# Patient Record
Sex: Female | Born: 1935 | Race: White | Hispanic: No | Marital: Married | Smoking: Former smoker
Health system: Southern US, Community
[De-identification: ages and names within clinical notes are randomized; demographics above are authoritative.]

## PROBLEM LIST (undated history)

## (undated) DIAGNOSIS — J449 Chronic obstructive pulmonary disease, unspecified: Secondary | ICD-10-CM

## (undated) DIAGNOSIS — K859 Acute pancreatitis without necrosis or infection, unspecified: Secondary | ICD-10-CM

## (undated) DIAGNOSIS — K219 Gastro-esophageal reflux disease without esophagitis: Secondary | ICD-10-CM

## (undated) DIAGNOSIS — C3490 Malignant neoplasm of unspecified part of unspecified bronchus or lung: Secondary | ICD-10-CM

## (undated) DIAGNOSIS — M8580 Other specified disorders of bone density and structure, unspecified site: Secondary | ICD-10-CM

## (undated) DIAGNOSIS — E039 Hypothyroidism, unspecified: Secondary | ICD-10-CM

## (undated) DIAGNOSIS — I10 Essential (primary) hypertension: Secondary | ICD-10-CM

## (undated) DIAGNOSIS — F39 Unspecified mood [affective] disorder: Secondary | ICD-10-CM

## (undated) DIAGNOSIS — K5909 Other constipation: Secondary | ICD-10-CM

## (undated) DIAGNOSIS — K8689 Other specified diseases of pancreas: Secondary | ICD-10-CM

## (undated) DIAGNOSIS — H269 Unspecified cataract: Secondary | ICD-10-CM

## (undated) DIAGNOSIS — I872 Venous insufficiency (chronic) (peripheral): Secondary | ICD-10-CM

## (undated) HISTORY — DX: Other constipation: K59.09

## (undated) HISTORY — DX: Venous insufficiency (chronic) (peripheral): I87.2

## (undated) HISTORY — DX: Unspecified mood (affective) disorder: F39

## (undated) HISTORY — DX: Acute pancreatitis without necrosis or infection, unspecified: K85.90

## (undated) HISTORY — DX: Unspecified cataract: H26.9

## (undated) HISTORY — DX: Hypothyroidism, unspecified: E03.9

## (undated) HISTORY — DX: Essential (primary) hypertension: I10

## (undated) HISTORY — DX: Chronic obstructive pulmonary disease, unspecified: J44.9

## (undated) HISTORY — DX: Other specified disorders of bone density and structure, unspecified site: M85.80

## (undated) HISTORY — DX: Malignant neoplasm of unspecified part of unspecified bronchus or lung: C34.90

## (undated) HISTORY — DX: Gastro-esophageal reflux disease without esophagitis: K21.9

## (undated) HISTORY — DX: Other specified diseases of pancreas: K86.89

---
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
## 1957-01-25 HISTORY — PX: OOPHORECTOMY: SHX86

## 1976-01-26 HISTORY — PX: OTHER SURGICAL HISTORY: SHX169

## 1984-01-26 HISTORY — PX: CATARACT EXTRACTION: SUR2

## 1985-01-25 HISTORY — PX: CATARACT EXTRACTION: SUR2

## 1989-01-25 HISTORY — PX: OTHER SURGICAL HISTORY: SHX169

## 1995-01-26 HISTORY — PX: RETINAL DETACHMENT SURGERY: SHX105

## 2006-01-25 HISTORY — PX: THORACOTOMY: SUR1349

## 2008-11-25 ENCOUNTER — Emergency Department: Payer: Self-pay | Admitting: Emergency Medicine

## 2009-05-19 ENCOUNTER — Ambulatory Visit: Payer: Self-pay | Admitting: Unknown Physician Specialty

## 2009-10-20 ENCOUNTER — Ambulatory Visit: Payer: Self-pay | Admitting: Gastroenterology

## 2009-10-22 LAB — PATHOLOGY REPORT

## 2010-08-19 ENCOUNTER — Ambulatory Visit: Payer: Self-pay | Admitting: Unknown Physician Specialty

## 2011-09-05 ENCOUNTER — Emergency Department: Payer: Self-pay | Admitting: Emergency Medicine

## 2011-10-21 ENCOUNTER — Ambulatory Visit: Payer: Self-pay | Admitting: Internal Medicine

## 2012-08-21 ENCOUNTER — Ambulatory Visit: Payer: Self-pay | Admitting: Internal Medicine

## 2013-01-31 ENCOUNTER — Ambulatory Visit: Payer: Self-pay | Admitting: Family Medicine

## 2013-02-06 ENCOUNTER — Ambulatory Visit: Payer: Self-pay | Admitting: Family Medicine

## 2013-02-26 ENCOUNTER — Ambulatory Visit: Payer: Self-pay | Admitting: Gastroenterology

## 2013-04-16 ENCOUNTER — Ambulatory Visit: Payer: Self-pay | Admitting: Gastroenterology

## 2013-04-27 ENCOUNTER — Ambulatory Visit: Payer: Self-pay | Admitting: Gastroenterology

## 2013-07-24 ENCOUNTER — Encounter (INDEPENDENT_AMBULATORY_CARE_PROVIDER_SITE_OTHER): Payer: Self-pay

## 2013-07-24 ENCOUNTER — Encounter: Payer: Self-pay | Admitting: Pulmonary Disease

## 2013-07-24 ENCOUNTER — Ambulatory Visit (INDEPENDENT_AMBULATORY_CARE_PROVIDER_SITE_OTHER): Payer: Medicare Other | Admitting: Pulmonary Disease

## 2013-07-24 VITALS — BP 128/56 | HR 73 | Ht 63.0 in | Wt 182.0 lb

## 2013-07-24 DIAGNOSIS — J432 Centrilobular emphysema: Secondary | ICD-10-CM

## 2013-07-24 DIAGNOSIS — R5383 Other fatigue: Secondary | ICD-10-CM | POA: Insufficient documentation

## 2013-07-24 DIAGNOSIS — R5381 Other malaise: Secondary | ICD-10-CM

## 2013-07-24 DIAGNOSIS — R0609 Other forms of dyspnea: Secondary | ICD-10-CM

## 2013-07-24 DIAGNOSIS — J438 Other emphysema: Secondary | ICD-10-CM

## 2013-07-24 DIAGNOSIS — I2789 Other specified pulmonary heart diseases: Secondary | ICD-10-CM

## 2013-07-24 DIAGNOSIS — I272 Pulmonary hypertension, unspecified: Secondary | ICD-10-CM | POA: Insufficient documentation

## 2013-07-24 DIAGNOSIS — R0989 Other specified symptoms and signs involving the circulatory and respiratory systems: Secondary | ICD-10-CM

## 2013-07-24 DIAGNOSIS — G4733 Obstructive sleep apnea (adult) (pediatric): Secondary | ICD-10-CM

## 2013-07-24 DIAGNOSIS — J439 Emphysema, unspecified: Secondary | ICD-10-CM | POA: Insufficient documentation

## 2013-07-24 DIAGNOSIS — R0600 Dyspnea, unspecified: Secondary | ICD-10-CM | POA: Insufficient documentation

## 2013-07-24 NOTE — Progress Notes (Signed)
Subjective:    Patient ID: Nicole Arnold, female    DOB: 1928/10/09, 80 y.o.   MRN: [id number]  HPI  This is a very pleasant 80 year old female who comes to our clinic today for evaluation of shortness of breath and possibly a pulmonary hypertension. She tells me that she smoked one half pack of cigarettes daily for 40 years and quit in 1995. As a child she had a severe episode of pneumonia when she was approximately 19 weeks old and apparently nearly died. Despite this, she never had problems with breathing or frequent cough over the years afterwards. She also tells me that she never had frequent episodes of bronchitis or pulmonary infections. 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 This is associated with a prolonged rehabilitation. Afterwards. Ever since then, her family says that her breathing has not quite been the same. She has had shortness of breath on exertion alone and never at rest. Her family believes that it has worsened somewhat over the last 4-6 years. Specifically, she tells me that she will get short of breath when she "moves too fast". She says that she cannot walk through the grocery store pushing a cart of groceries without getting short of breath. She can carry in groceries but this does cause some dyspnea. Often her walking is limited by severe back pain which is been a problem for her over the years. She does not climb a flight of stairs and she does not clean her house to run a vacuum cleaner on a regular basis.  She does not have a cough on a regular basis and she does not produce mucus.  In addition to the dyspnea she tells me that she has had generalized fatigue for the last several years with the shortness of breath. She says that she often just feels like she doesn't have the energy to get up and go. She does not exercise on a regular basis. She has not taken help therapies.  She says that she takes  a nap every day in the afternoon for about an hour. She sleeps from 9 PM until about 6 AM. She has been told that she snores but she does not know she has witnessed apneas. She denies morning headaches. She has never had a polysomnogram.  Past Medical History  Diagnosis Date  . Pancreatitis   . Cataract   . Chronic constipation   . GERD (gastroesophageal reflux disease)   . Osteopenia      No family history on file.   History   Social History  . Marital Status: Married    Spouse Name: N/A    Number of Children: N/A  . Years of Education: N/A   Occupational History  . Not on file.   Social History Main Topics  . Smoking status: Former Smoker -- 0.50 packs/day for 30 years    Types: Cigarettes    Quit date: 01/25/1993  . Smokeless tobacco: Former Systems developer  . Alcohol Use: Not on file  . Drug Use: Not on file  . Sexual Activity: Not on file   Other Topics Concern  . Not on file   Social History Narrative  . No narrative on file     Allergies  Allergen Reactions  . Amoxicillin     Mouth/tongue/throat swelling  . Codeine     vomiting  . Neosporin [Neomycin-Bacitracin Zn-Polymyx]   . Sulfa Antibiotics      No outpatient  prescriptions prior to visit.   No facility-administered medications prior to visit.      Review of Systems  Constitutional: Negative for fever and unexpected weight change.  HENT: Negative for congestion, dental problem, ear pain, nosebleeds, postnasal drip, rhinorrhea, sinus pressure, sneezing, sore throat and trouble swallowing.   Eyes: Negative for redness and itching.  Respiratory: Positive for shortness of breath. Negative for cough, chest tightness and wheezing.   Cardiovascular: Positive for leg swelling. Negative for palpitations.  Gastrointestinal: Negative for nausea and vomiting.  Genitourinary: Negative for dysuria.  Musculoskeletal: Negative for joint swelling.  Skin: Negative for rash.  Neurological: Negative for headaches.    Hematological: Does not bruise/bleed easily.  Psychiatric/Behavioral: Negative for dysphoric mood. The patient is not nervous/anxious.        Objective:   Physical Exam Filed Vitals:   07/24/13 1443  BP: 128/56  Pulse: 73  Height: 5\' 3"  (1.6 m)  Weight: 182 lb (82.555 kg)  SpO2: 95%  RA  Ambulated 500 feet on room air her oxygen saturation remained above 95%.  Gen: Elderly white female, overweight, no acute distress HEENT: NCAT, PERRL, EOMi, OP clear, neck supple without masses PULM: Few crackles left base, wheezes right upper lobe otherwise clear with good air movement CV: RRR, no mgr, no JVD AB: BS+, soft, nontender, no hsm Ext: warm, no edema, no clubbing, no cyanosis Derm: no rash or skin breakdown Neuro: A&Ox4, CN II-XII intact, strength 5/5 in all 4 extremities  April 2015 CT chest reviewed by me> there is centrilobular emphysema more prominent in the bases, there is scarring in her left base. There is mild bronchiectasis in the right middle lobe as well as in the lingula. There was mild tree in bud abnormalities in the right upper lobe, there were multiple scattered pulmonary nodules about 5 mm in size  April 2015 echocardiogram> LVEF 60%. Left ventricle is normal in size. RV is normal in size and function but there was a comment of mild dilatation and the RVSP was estimated to be 59 mmHg     Assessment & Plan:   Dyspnea I explained to Nicole Arnold and her family today that the differential diagnosis of dyspnea is broad and includes lung disease, heart disease, anemia, and neurologic illnesses among many other things. In her particular case, she appears to be deconditioned, is slightly overweight, has emphysema on his CT scan of her chest, and appears to have pulmonary hypertension. All of this can contribute to shortness of breath. I do not believe that she is anemic, but I do not have recent blood work to evaluate that. From the sounds of things she does not have  underlying cardiac disease with the exception of a suggestion of pulmonary hypertension seen on a recent echocardiogram, see below.  She appeared to have mild bronchiectasis on the CT scan of her chest. This was in the right middle lobe and lingula. I believe this is related to a prior severe infection she had in infancy. Fortunately, she does not have symptoms of cough or mucus production to suggest that this is clinically significant.  Today when she ambulated she did not require oxygen to maintain her oxygen saturation above 94%.  Plan: See outline below  Emphysema lung Though not mentioned by radiology, she had mild centrilobular emphysema on the April 2015 CT scan of her chest. I believe that this is directly related to her previous smoking behavior. She smoked one half packs of cigarettes daily for 40 years and  quit in 1995.  Plan: -Full pulmonary function testing to look for COPD  Pulmonary hypertension Her echocardiogram from April 2015 showed a normal left ventricular systolic function, but mildly dilated right heart with preserved RV systolic function but significantly elevated pulmonary pressures. The RVSP was estimated to be 59 mmHg.  I explained to her and her family that this is only an estimate of pulmonary pressure and that the gold standard would be to perform a right heart catheterization. The differential diagnosis of pulmonary hypertension is broad. In recent years this condition has been diagnosed more frequently in older adults but it is rarely caused by a specific condition that would respond to pulmonary vasodilators. In her particular case I am most concerned about underlying pulmonary disease (IE emphysema) as well as possibly obstructive sleep apnea which could contribute.  Plan: -We will start our evaluation with pulmonary function testing and a sleep study. - Because the likelihood of her being a candidate for pulmonary vasodilators is low, we will hold off on a  right heart cath at this time  Fatigue She notes generalized fatigue, afternoon somnolence, and heavy snoring. She is overweight and with this constellation of symptoms I am concerned about obstructive sleep apnea.  Plan: -Polysomnogram ordered    Updated Medication List Outpatient Encounter Prescriptions as of 07/24/2013  Medication Sig  . ALPRAZolam (XANAX) 0.5 MG tablet Take 0.5 mg by mouth 3 (three) times daily as needed for anxiety.  Marland Kitchen amLODipine (NORVASC) 10 MG tablet Take 10 mg by mouth daily.  Marland Kitchen aspirin 81 MG tablet Take 81 mg by mouth daily.  . Calcium-Vitamin D (CALTRATE 600 PLUS-VIT D PO) Take 1 tablet by mouth 2 (two) times daily.  . lansoprazole (PREVACID) 30 MG capsule Take 30 mg by mouth daily at 12 noon.  . Multiple Vitamins-Minerals (CENTRUM SILVER ADULT 50+ PO) Take 1 tablet by mouth daily.  Marland Kitchen PARoxetine (PAXIL) 20 MG tablet Take 20 mg by mouth daily.  . polyethylene glycol (MIRALAX / GLYCOLAX) packet Take 17 g by mouth daily.  . potassium chloride (KLOR-CON) 20 MEQ packet Take 20 mEq by mouth daily.  . raloxifene (EVISTA) 60 MG tablet Take 60 mg by mouth daily.  Marland Kitchen torsemide (DEMADEX) 10 MG tablet Take 10 mg by mouth daily.  . vitamin E 400 UNIT capsule Take 400 Units by mouth daily.

## 2013-07-24 NOTE — Patient Instructions (Signed)
We will arrange a pulmonary function test and a sleep study for you Stay active and exercise regularly We will see you back in 3-4 weeks in [facility]

## 2013-07-24 NOTE — Assessment & Plan Note (Addendum)
Her echocardiogram from April 2015 showed a normal left ventricular systolic function, but mildly dilated right heart with preserved RV systolic function but significantly elevated pulmonary pressures. The RVSP was estimated to be 59 mmHg.  I explained to her and her family that this is only an estimate of pulmonary pressure and that the gold standard would be to perform a right heart catheterization. The differential diagnosis of pulmonary hypertension is broad. In recent years this condition has been diagnosed more frequently in older adults but it is rarely caused by a specific condition that would respond to pulmonary vasodilators. In her particular case I am most concerned about underlying pulmonary disease (IE emphysema) as well as possibly obstructive sleep apnea which could contribute.  Plan: -We will start our evaluation with pulmonary function testing and a sleep study. - Because the likelihood of her being a candidate for pulmonary vasodilators is low, we will hold off on a right heart cath at this time

## 2013-07-24 NOTE — Assessment & Plan Note (Addendum)
I explained to Nicole Arnold and her family today that the differential diagnosis of dyspnea is broad and includes lung disease, heart disease, anemia, and neurologic illnesses among many other things. In her particular case, she appears to be deconditioned, is slightly overweight, has emphysema on his CT scan of her chest, and appears to have pulmonary hypertension. All of this can contribute to shortness of breath. I do not believe that she is anemic, but I do not have recent blood work to evaluate that. From the sounds of things she does not have underlying cardiac disease with the exception of a suggestion of pulmonary hypertension seen on a recent echocardiogram, see below.  She appeared to have mild bronchiectasis on the CT scan of her chest. This was in the right middle lobe and lingula. I believe this is related to a prior severe infection she had in infancy. Fortunately, she does not have symptoms of cough or mucus production to suggest that this is clinically significant.  Today when she ambulated she did not require oxygen to maintain her oxygen saturation above 94%.  Plan: See outline below

## 2013-07-24 NOTE — Assessment & Plan Note (Signed)
Though not mentioned by radiology, she had mild centrilobular emphysema on the April 2015 CT scan of her chest. I believe that this is directly related to her previous smoking behavior. She smoked one half packs of cigarettes daily for 40 years and quit in 1995.  Plan: -Full pulmonary function testing to look for COPD

## 2013-07-24 NOTE — Assessment & Plan Note (Signed)
She notes generalized fatigue, afternoon somnolence, and heavy snoring. She is overweight and with this constellation of symptoms I am concerned about obstructive sleep apnea.  Plan: -Polysomnogram ordered

## 2013-07-31 ENCOUNTER — Ambulatory Visit (HOSPITAL_BASED_OUTPATIENT_CLINIC_OR_DEPARTMENT_OTHER): Payer: Medicare Other | Attending: Pulmonary Disease | Admitting: Radiology

## 2013-07-31 ENCOUNTER — Ambulatory Visit: Payer: Self-pay | Admitting: Pulmonary Disease

## 2013-07-31 VITALS — Ht 63.0 in | Wt 182.0 lb

## 2013-07-31 DIAGNOSIS — I491 Atrial premature depolarization: Secondary | ICD-10-CM | POA: Insufficient documentation

## 2013-07-31 DIAGNOSIS — G4733 Obstructive sleep apnea (adult) (pediatric): Secondary | ICD-10-CM | POA: Insufficient documentation

## 2013-07-31 DIAGNOSIS — G4710 Hypersomnia, unspecified: Secondary | ICD-10-CM | POA: Diagnosis present

## 2013-07-31 DIAGNOSIS — I498 Other specified cardiac arrhythmias: Secondary | ICD-10-CM | POA: Insufficient documentation

## 2013-07-31 LAB — PULMONARY FUNCTION TEST

## 2013-08-06 ENCOUNTER — Ambulatory Visit (INDEPENDENT_AMBULATORY_CARE_PROVIDER_SITE_OTHER): Payer: Medicare Other | Admitting: Pulmonary Disease

## 2013-08-06 ENCOUNTER — Encounter: Payer: Self-pay | Admitting: Pulmonary Disease

## 2013-08-06 VITALS — BP 124/68 | HR 70 | Ht 63.0 in | Wt 182.0 lb

## 2013-08-06 DIAGNOSIS — I272 Pulmonary hypertension, unspecified: Secondary | ICD-10-CM

## 2013-08-06 DIAGNOSIS — J449 Chronic obstructive pulmonary disease, unspecified: Secondary | ICD-10-CM

## 2013-08-06 DIAGNOSIS — J432 Centrilobular emphysema: Secondary | ICD-10-CM

## 2013-08-06 DIAGNOSIS — R5383 Other fatigue: Secondary | ICD-10-CM

## 2013-08-06 DIAGNOSIS — I2789 Other specified pulmonary heart diseases: Secondary | ICD-10-CM

## 2013-08-06 DIAGNOSIS — J438 Other emphysema: Secondary | ICD-10-CM

## 2013-08-06 DIAGNOSIS — R5381 Other malaise: Secondary | ICD-10-CM

## 2013-08-06 MED ORDER — IPRATROPIUM-ALBUTEROL 20-100 MCG/ACT IN AERS: 1.0000 | INHALATION_SPRAY | Freq: Four times a day (QID) | RESPIRATORY_TRACT | Status: DC | PRN

## 2013-08-06 NOTE — Progress Notes (Signed)
Subjective:    Patient ID: Nicole Arnold, female    DOB: February 26, 1928, 80 y.o.   MRN: [id number]  Synopsis: First saw [facility] in 2015 for evaluation of mild pulmonary hypertension seen on an echocardiogram.  07/2013 PFT> Ratio 56% FEV1 1.56L (96%, 9% change), TLC 3.85L (85% pred), DLCO 9.8 (50% pred)  April 2015 CT chest reviewed by me> there is centrilobular emphysema more prominent in the bases, there is scarring in her left base. There is mild bronchiectasis in the right middle lobe as well as in the lingula. There was mild tree in bud abnormalities in the right upper lobe, there were multiple scattered pulmonary nodules about 5 mm in size  April 2015 echocardiogram> LVEF 60%. Left ventricle is normal in size. RV is normal in size and function but there was a comment of mild dilatation and the RVSP was estimated to be 59 mmHg   HPI  08/06/2013 > Nygeria has been doing okay since the last visit. There is really been no change in her symptoms. She has been trying to walk more regularly and has been 6 the last 7 days. She has not had wheezing, chest pain, or cough. She does have a bit of leg swelling today which she attributes to the fact that she's not wearing her compression stockings. Otherwise, she is here to followup on the results of the testing we ordered last week.  Past Medical History  Diagnosis Date  . Pancreatitis   . Cataract   . Chronic constipation   . GERD (gastroesophageal reflux disease)   . Osteopenia      Review of Systems  Constitutional: Positive for fatigue. Negative for fever and chills.  HENT: Negative for nosebleeds, postnasal drip, rhinorrhea and sinus pressure.   Respiratory: Positive for shortness of breath. Negative for cough and wheezing.   Cardiovascular: Positive for leg swelling. Negative for chest pain and palpitations.       Objective:   Physical Exam Filed Vitals:   08/06/13 1614  BP: 124/68  Pulse: 70  Height: 5\' 3"   (1.6 m)  Weight: 182 lb (82.555 kg)  SpO2: 96%   Room air  Gen: well appearing, no acute distress HEENT: NCAT, EOMi, OP clear PULM: diminished air movement but no wheezing CV: RRR, no mgr, no JVD AB: BS+, soft, nontender, no hsm Ext: warm, no edema, no clubbing, no cyanosis Derm: no rash or skin breakdown Neuro: A&Ox4, MAEW        Assessment & Plan:   Emphysema lung She has mild airflow obstruction consistent with mild COPD. Her CT chest also showed centrilobular emphysema so this is not unexpected. This is due to her prior tobacco use.  This also likely explains at least in part her pulmonary hypertension from echocardiogram.  Plan: - Because she has mild disease I will start Combivent 4 times a day when necessary shortness of breath. I have advised that she use it at least twice a day for the first week or 2 to see if she will get benefit -Pulmonary rehabilitation referral for deconditioning and shortness of breath  Fatigue I continue to await the results of her polysomnogram.  Pulmonary hypertension Again, I do not think that this is primary pulmonary hypertension or WHO class I pulmonary hypertension.  Plan: -Followup polysomnogram results -Treat COPD/emphysema -Monitor 6 minute walk during pulmonary rehabilitation and again in 6 months. If there is a clear decline despite regular exercise, bronchodilator use, and CPAP use (if indicated) then consider right  heart catheterization.    Updated Medication List Outpatient Encounter Prescriptions as of 08/06/2013  Medication Sig  . ALPRAZolam (XANAX) 0.5 MG tablet Take 0.5 mg by mouth 3 (three) times daily as needed for anxiety.  Marland Kitchen amLODipine (NORVASC) 10 MG tablet Take 10 mg by mouth daily.  Marland Kitchen aspirin 81 MG tablet Take 81 mg by mouth daily.  . Calcium-Vitamin D (CALTRATE 600 PLUS-VIT D PO) Take 1 tablet by mouth 2 (two) times daily.  . lansoprazole (PREVACID) 30 MG capsule Take 30 mg by mouth daily at 12 noon.  .  Multiple Vitamins-Minerals (CENTRUM SILVER ADULT 50+ PO) Take 1 tablet by mouth daily.  Marland Kitchen PARoxetine (PAXIL) 20 MG tablet Take 20 mg by mouth daily.  . polyethylene glycol (MIRALAX / GLYCOLAX) packet Take 17 g by mouth daily.  . potassium chloride (KLOR-CON) 20 MEQ packet Take 20 mEq by mouth daily.  . raloxifene (EVISTA) 60 MG tablet Take 60 mg by mouth daily.  Marland Kitchen torsemide (DEMADEX) 10 MG tablet Take 10 mg by mouth daily.  . vitamin E 400 UNIT capsule Take 400 Units by mouth daily.

## 2013-08-06 NOTE — Patient Instructions (Signed)
We will call you with the results of the sleep study Use combivent one puff at least twice a day and up to four times a day as needed for shortness of breath We will refer you to pulmonary rehab We will see you back in 3 months or sooner if needed

## 2013-08-06 NOTE — Assessment & Plan Note (Signed)
She has mild airflow obstruction consistent with mild COPD. Her CT chest also showed centrilobular emphysema so this is not unexpected. This is due to her prior tobacco use.  This also likely explains at least in part her pulmonary hypertension from echocardiogram.  Plan: - Because she has mild disease I will start Combivent 4 times a day when necessary shortness of breath. I have advised that she use it at least twice a day for the first week or 2 to see if she will get benefit -Pulmonary rehabilitation referral for deconditioning and shortness of breath

## 2013-08-06 NOTE — Assessment & Plan Note (Addendum)
Again, I do not think that this is primary pulmonary hypertension or WHO class I pulmonary hypertension.  Plan: -Followup polysomnogram results -Treat COPD/emphysema -Monitor 6 minute walk during pulmonary rehabilitation and again in 6 months. If there is a clear decline despite regular exercise, bronchodilator use, and CPAP use (if indicated) then consider right heart catheterization.

## 2013-08-06 NOTE — Assessment & Plan Note (Signed)
I continue to await the results of her polysomnogram.

## 2013-08-17 ENCOUNTER — Ambulatory Visit: Payer: Self-pay | Admitting: Internal Medicine

## 2013-08-21 ENCOUNTER — Telehealth: Payer: Self-pay | Admitting: Pulmonary Disease

## 2013-08-21 DIAGNOSIS — G4730 Sleep apnea, unspecified: Secondary | ICD-10-CM

## 2013-08-21 NOTE — Telephone Encounter (Signed)
Also was she supposed to go to rehab and when is she due for follow up

## 2013-08-21 NOTE — Telephone Encounter (Signed)
PT is calling for sleep study results.  Have you seen these Dr Lake Bells?

## 2013-08-21 NOTE — Telephone Encounter (Signed)
I have not Please track them down and leave in look at folder

## 2013-08-22 NOTE — Telephone Encounter (Signed)
Spoke with pt, she is aware of the info below re: sleep study, rov, and pulmonary rehab. Sleep study has been received, will place in BQ's look-at.  [place] will also read this when he returns to the office.  Alida has the referral forms for pulm rehab that have already been sent to [facility],, pt needs a pft on file to qualify for pulm rehab.  This has already been done at [facility].   Will route to BQ as an fyi.  Also routing to [place], to advise about the paperwork needed for pulmonary rehab.

## 2013-08-22 NOTE — Telephone Encounter (Signed)
As we discussed today, she needs a Resmed 9 Autotitrating machine set up at home 5-20cm

## 2013-08-22 NOTE — Telephone Encounter (Signed)
Spoke with Terri at sleep center, she states the sleep study has not been interpreted yet but [place] is the one to read it and he is out this week.  Karna Christmas is faxing over the results that she has to our triage line.  Will look out for this and put in BQ's box when received.    Also, referral to [facility] pulm. Rehab was placed on 7/13.  Spoke with Erline Levine with cardiopulmonary rehab.  She states that it takes about 3 weeks from them receiving the referral to the pt getting their first visit at rehab.  She is re-faxing a referral sheet for pulm. Rehab to the [facility] fax line.  We will fill this out once we receive this.   Also, BQ wanted to see pt back in 3 mos.  DLV 08/06/13.  A recall is in her chart to schedule this appt closer to time when his schedule goes out far enough.  ATC pt, na, no option to leave vm.  WCB

## 2013-08-22 NOTE — Telephone Encounter (Signed)
Pt returned call

## 2013-08-22 NOTE — Telephone Encounter (Signed)
Spoke with pt, she is aware of results and recs.  cpap and download ordered.  Nothing further needed at this time.

## 2013-08-23 ENCOUNTER — Telehealth: Payer: Self-pay | Admitting: Pulmonary Disease

## 2013-08-23 NOTE — Telephone Encounter (Signed)
ATC line busy wcb 

## 2013-08-23 NOTE — Telephone Encounter (Signed)
Spoke with the pt  She states that she does not want to start on CPAP until she speaks with Dr Lake Bells  She states that she feels like the PSG results need to be explained in more detail to her before starting on therapy for OSA  She states that she thought that she would have a f/u visit to sit down and discuss everything  She is upset and states "I feel like I'm being thrown out in the left field"  I advised that we will be happy to arrange her an appt to discuss  She did not want to wait until next available appt in [place] and refused [place] [facility]  She would like something sooner or for Dr Lake Bells to call her to discuss  She prefers a phone call so that she does not have to pay another copay  Please advise thanks

## 2013-08-24 NOTE — Telephone Encounter (Signed)
Pt scheduled next avalable. Nothing further needed

## 2013-08-24 NOTE — Telephone Encounter (Signed)
OK will see her next visit and discuss

## 2013-08-28 ENCOUNTER — Encounter: Payer: Self-pay | Admitting: Pulmonary Disease

## 2013-08-28 DIAGNOSIS — G4730 Sleep apnea, unspecified: Secondary | ICD-10-CM

## 2013-08-28 DIAGNOSIS — G4710 Hypersomnia, unspecified: Secondary | ICD-10-CM

## 2013-08-28 NOTE — Sleep Study (Signed)
   NAME: Nicole Arnold DATE OF BIRTH:  [birth year] MEDICAL RECORD NUMBER [id number]  LOCATION: [facility]  PHYSICIAN: Kathee Delton  DATE OF STUDY: 07/31/2013  SLEEP STUDY TYPE: Nocturnal Polysomnogram               REFERRING PHYSICIAN: Juanito Doom, MD  INDICATION FOR STUDY: Hypersomnia with sleep apnea  EPWORTH SLEEPINESS SCORE:  2 HEIGHT: 5\' 3"  (160 cm)  WEIGHT: 182 lb (82.555 kg)    Body mass index is 32.25 kg/(m^2).  NECK SIZE: 14 in.  MEDICATIONS: Reviewed in the sleep record  SLEEP ARCHITECTURE: The patient had a total sleep time of 355 minutes with no slow-wave sleep and only 23 minutes of REM. Sleep onset latency was prolonged at 44 minutes, and REM onset was very delayed at 316 minutes. Sleep efficiency was mildly reduced at 81%.  RESPIRATORY DATA: The patient was found to have 14 apneas and 55 obstructive hypopneas, giving her an AHI of 12 events per hour. The events occurred in all positions, and there was moderate snoring noted throughout.  OXYGEN DATA:  There was oxygen desaturation as low as 88% with the patient's obstructive events  CARDIAC DATA: Occasional PAC noted, and as well as one 12 beat run of supraventricular tachycardia with a rate of approximately 140 beats per minute.  MOVEMENT/PARASOMNIA: No significant limb movements or abnormal behaviors were noted.  IMPRESSION/ RECOMMENDATION:  1) mild obstructive sleep apnea/hypopnea syndrome, with an AHI of 12 events per hour and oxygen desaturation as low as 88%. Treatment for this degree of sleep apnea can include a trial of weight loss alone, upper airway surgery, dental appliance, and also CPAP. Clinical correlation is suggested. The decision to treat this degree of sleep apnea should be based upon its impact to the patient's quality of life, since it does not represent a significant risk to her cardiovascular health.  2) occasional PAC noted, as well as one 12 beat run of  supraventricular tachycardia at a rate of 140 beats per minute.     Kathee Delton Diplomate, American Board of Sleep Medicine  ELECTRONICALLY SIGNED ON:  08/28/2013, 6:34 PM [place] PH: [phone]   FX: [phone] [place]

## 2013-08-30 ENCOUNTER — Telehealth: Payer: Self-pay

## 2013-08-30 NOTE — Telephone Encounter (Signed)
Message copied by Len Blalock on Thu Aug 30, 2013  5:13 PM ------      Message from: Simonne Maffucci B      Created: Wed Aug 29, 2013 11:45 AM       Lanny Hurst, no problem. thanks      Caryl Pina, please let her know that this only showed mild OSA and we will discuss what to do about it on the next visit.      ----- Message -----         From: Kathee Delton, MD         Sent: 08/28/2013   6:38 PM           To: Juanito Doom, MD            Ruby Cola, this pt's sleep study showed mild osa with ahi 12/hr.  Sorry it took so long to get the interpretation.  I read about 3-4 times last month, and was not in my box when I left town.  I suspect got lost in the shuffle??       ------

## 2013-08-30 NOTE — Telephone Encounter (Signed)
Pt aware of sleep study results.  Nothing further needed at this time.

## 2013-09-07 ENCOUNTER — Encounter: Payer: Self-pay | Admitting: Internal Medicine

## 2013-09-07 ENCOUNTER — Ambulatory Visit (INDEPENDENT_AMBULATORY_CARE_PROVIDER_SITE_OTHER): Payer: Medicare Other | Admitting: Internal Medicine

## 2013-09-07 VITALS — BP 128/64 | HR 68 | Ht 63.0 in | Wt 182.0 lb

## 2013-09-07 DIAGNOSIS — J438 Other emphysema: Secondary | ICD-10-CM

## 2013-09-07 DIAGNOSIS — R0609 Other forms of dyspnea: Secondary | ICD-10-CM

## 2013-09-07 DIAGNOSIS — I2789 Other specified pulmonary heart diseases: Secondary | ICD-10-CM

## 2013-09-07 DIAGNOSIS — J432 Centrilobular emphysema: Secondary | ICD-10-CM

## 2013-09-07 DIAGNOSIS — R0600 Dyspnea, unspecified: Secondary | ICD-10-CM

## 2013-09-07 DIAGNOSIS — E669 Obesity, unspecified: Secondary | ICD-10-CM | POA: Insufficient documentation

## 2013-09-07 DIAGNOSIS — G4733 Obstructive sleep apnea (adult) (pediatric): Secondary | ICD-10-CM | POA: Insufficient documentation

## 2013-09-07 DIAGNOSIS — R0989 Other specified symptoms and signs involving the circulatory and respiratory systems: Secondary | ICD-10-CM

## 2013-09-07 DIAGNOSIS — I272 Pulmonary hypertension, unspecified: Secondary | ICD-10-CM

## 2013-09-07 NOTE — Patient Instructions (Signed)
We will set up cpap for you.  We will see you back in 3-4 months.

## 2013-09-07 NOTE — Assessment & Plan Note (Signed)
Again I have rewritten her and her family, what Dr. Lake Bells had stated her last visit. Dyspnea is most likely multifactorial, especially giving her most recent sleep study results. Multifactorial dyspnea: Mild emphysema, mild broncho-obstruction, mild OSA, obesity, and deconditioning.   In her particular case, she appears to be deconditioned, is slightly overweight, has emphysema on his CT scan of her chest, and appears to have pulmonary hypertension. All of this can contribute to shortness of breath.  She appeared to have mild bronchiectasis on the CT scan of her chest. This was in the right middle lobe and lingula. This is probably related to a prior severe infection she had in infancy. Fortunately, she does not have symptoms of cough or mucus production to suggest that this is clinically significant.  At her last visit when she ambulated she did not require oxygen to maintain her oxygen saturation above 94%.  Plan: See outline below

## 2013-09-07 NOTE — Progress Notes (Signed)
MRN# [id number] Nishita Isaacks [birth year]  CC: Chief Complaint  Patient presents with  . Follow-up    Dr. Lake Bells pt here to review sleep study.        Brief Synopsis: First saw [facility] in 2015 for evaluation of mild pulmonary hypertension seen on an echocardiogram.  07/2013 PFT> Ratio 56% FEV1 1.56L (96%, 9% change), TLC 3.85L (85% pred), DLCO 9.8 (50% pred)  April 2015 CT chest reviewed by me> there is centrilobular emphysema more prominent in the bases, there is scarring in her left base. There is mild bronchiectasis in the right middle lobe as well as in the lingula. There was mild tree in bud abnormalities in the right upper lobe, there were multiple scattered pulmonary nodules about 5 mm in size  April 2015 echocardiogram> LVEF 60%. Left ventricle is normal in size. RV is normal in size and function but there was a comment of mild dilatation and the RVSP was estimated to be 59 mmHg    08/06/2013 > Cuca has been doing okay since the last visit. There is really been no change in her symptoms. She has been trying to walk more regularly and has been 6 the last 7 days. She has not had wheezing, chest pain, or cough. She does have a bit of leg swelling today which she attributes to the fact that she's not wearing her compression stockings. Otherwise, she is here to followup on the results of the testing we ordered last week.   09/07/2013 > patient states that she's been doing okay since her last visit. The main purpose of her visit today is to discuss in detail the results of her echocardiogram, and her sleep study.  She is accompanied today by her daughter. Her only complaint today is mild shortness of breath, but this seems to be improving since she's been using her Combivent twice a day.  PMHX:   Past Medical History  Diagnosis Date  . Pancreatitis   . Cataract   . Chronic constipation   . GERD (gastroesophageal reflux disease)   . Osteopenia    Surgical Hx:   Past Surgical History  Procedure Laterality Date  . Appendectomy  1949  . Oophorectomy  1959    right ovary, part of left  . Uterine hysterectomy  1978  . Cataract extraction Right 1987  . Cataract extraction Left 1986  . Detached retina repair Right 1991  . Retinal detachment surgery Left 1997   Family Hx:  No family history on file. Social Hx:   History  Substance Use Topics  . Smoking status: Former Smoker -- 0.50 packs/day for 30 years    Types: Cigarettes    Quit date: 01/25/1993  . Smokeless tobacco: Never Used  . Alcohol Use: Not on file   Medication:   Current Outpatient Rx  Name  Route  Sig  Dispense  Refill  . ALPRAZolam (XANAX) 0.5 MG tablet   Oral   Take 0.5 mg by mouth 3 (three) times daily as needed for anxiety.         Marland Kitchen amLODipine (NORVASC) 10 MG tablet   Oral   Take 10 mg by mouth daily.         Marland Kitchen aspirin 81 MG tablet   Oral   Take 81 mg by mouth daily.         . Calcium-Vitamin D (CALTRATE 600 PLUS-VIT D PO)   Oral   Take 1 tablet by mouth 2 (two) times daily.         Marland Kitchen  Ipratropium-Albuterol (COMBIVENT RESPIMAT) 20-100 MCG/ACT AERS respimat   Inhalation   Inhale 1 puff into the lungs every 6 (six) hours as needed for wheezing or shortness of breath.   3 Inhaler   3   . lansoprazole (PREVACID) 30 MG capsule   Oral   Take 30 mg by mouth daily at 12 noon.         . Multiple Vitamins-Minerals (CENTRUM SILVER ADULT 50+ PO)   Oral   Take 1 tablet by mouth daily.         Marland Kitchen PARoxetine (PAXIL) 20 MG tablet   Oral   Take 20 mg by mouth daily.         . polyethylene glycol (MIRALAX / GLYCOLAX) packet   Oral   Take 17 g by mouth daily.         . potassium chloride (KLOR-CON) 20 MEQ packet   Oral   Take 20 mEq by mouth daily.         . raloxifene (EVISTA) 60 MG tablet   Oral   Take 60 mg by mouth daily.         Marland Kitchen torsemide (DEMADEX) 10 MG tablet   Oral   Take 10 mg by mouth daily.         . vitamin E 400 UNIT  capsule   Oral   Take 400 Units by mouth daily.            Review of Systems: Gen:  Denies  fever, sweats, chills HEENT: Denies blurred vision, double vision, ear pain, eye pain, hearing loss, nose bleeds, sore throat Cvc:  No dizziness, chest pain or heaviness Resp:   Mild sob, no cough or sputum production Gi: Denies swallowing difficulty, stomach pain, nausea or vomiting, diarrhea, constipation, bowel incontinence Gu:  Denies bladder incontinence, burning urine Ext:   No Joint pain, stiffness or swelling Skin: No skin rash, easy bruising or bleeding or hives Endoc:  No polyuria, polydipsia , polyphagia or weight change Psych: No depression, insomnia or hallucinations  Other:  All other systems negative  Allergies:  Amoxicillin; Codeine; Neosporin; and Sulfa antibiotics  Physical Examination:  VS: BP 128/64  Pulse 68  Ht 5\' 3"  (1.6 m)  Wt 182 lb (82.555 kg)  BMI 32.25 kg/m2  SpO2 96%  General Appearance: No distress  Neuro: EXAM: without focal findings, mental status, speech normal, alert and oriented, cranial nerves 2-12 grossly normal  HEENT: PERRLA, EOM intact, no ptosis, no other lesions noticed Pulmonary:Exam: normal breath sounds., diaphragmatic excursion normal.No wheezing, No rales   Cardiovascular:@ Exam:  Normal S1,S2.  No m/r/g.     Abdomen:Exam: Benign, Soft, non-tender, No masses  Skin:   warm, no rashes, no ecchymosis  Extremities: normal, no cyanosis, clubbing, no edema, warm with normal capillary refill.   Labs results:  BMP No results found for this basename: na, k, cl, co2, glucose, bun, creatinine     CBC No flowsheet data found.   Sleep Study Results 07/31/13 AHI:12 Lowest Saturation: 88%  IMPRESSION: Mild obstructive sleep apnea syndrome, with an AHI of 12 events per hour and oxygen desaturation as low as 88 %. Treatment for this degree of sleep apnea can include a trial of weight loss, upper airway surgery, dental appliance, and also CPAP.  Clinical correlation is suggested. The decision to treat this degree of sleep apnea should be based upon its impact to the patient's quality of life, since it does represent a significant risk to her cardiovascular health.  Rad results:  ECHO 07/2013  Normal left ventricular systolic function. Mildly dilated right heart with normal RV systolic function and significantly elevated pulmonary pressure; RV estimated at 57mmHg.   CT Chest 04/2013 FINDINGS: The 4 mm left apical nodule is stable. There is a stable  pleural-based density posteriorly in the left pulmonary apex.  There is scarring in the right pulmonary apex. The 4 mm diameter nodule anterolaterally in the right lower lobe on image 47 is stable.  The patchy 5 mm nodule adjacent to the minor fissure seen today on image 33 is stable.  There is stable fibrotic change in the lingula.  There s patchy interstitial density anteriorly and inferiorly in the left lower lobe which is slightly more conspicuous today.  Similar findings in the medial aspect of the right middle lobe are stable. There is no alveolar pneumonia.  The cardiac chambers are top-normal in size. There are coronary artery calcifications. The caliber of the thoracic aorta is normal.  There is a stable precarinal lymph node which measures 11 mm in short axis. A stable sub carinal lymph node measures 10 mm in short axis. The thoracic esophagus is unremarkable.  Within the upper abdomen the observed portions of the liver are normal. There is a known pancreatic body mass containing calcification. It measures approximately a 6 cm in greatest transverse dimension at the level of the coarse calcifications. There are low-density foci within it as well. No adrenal masses are demonstrated.  IMPRESSION:  1. The multiple subcentimeter pulmonary parenchymal nodules are  stable since study of approximately 11 weeks ago. An additional follow-up noncontrast chest CT scan in 6 months is recommended to  assure ongoing stability.  2. There is patchy density anteriorly in the left lower lobe which may reflect subcentimeter atelectasis or pneumonitis type process.  3. There is no evidence of CHF nor of a pleural effusion. Stable borderline enlarged mediastinal lymph nodes are demonstrated.  4. The known large pancreatic body mass is again demonstrated and may have increased slightly in size.    Assessment and Plan: 80 year old female presenting today for followup visit. Emphysema lung She has mild airflow obstruction consistent with mild COPD. Her CT chest also showed centrilobular emphysema so this is not unexpected. This is due to her prior tobacco use.  This also likely explains at least in part her pulmonary hypertension from echocardiogram and the symptoms of dyspnea  Plan: - Continue with Combivent 4 times a day when necessary shortness of breath. I have reinstated that she use it at least twice a day to see any significant benefit - She be starting Pulmonary rehabilitation on September 1 for deconditioning and shortness of breath    Dyspnea Again I have rewritten her and her family, what Dr. Lake Bells had stated her last visit. Dyspnea is most likely multifactorial, especially giving her most recent sleep study results. Multifactorial dyspnea: Mild emphysema, mild broncho-obstruction, mild OSA, obesity, and deconditioning.   In her particular case, she appears to be deconditioned, is slightly overweight, has emphysema on his CT scan of her chest, and appears to have pulmonary hypertension. All of this can contribute to shortness of breath.  She appeared to have mild bronchiectasis on the CT scan of her chest. This was in the right middle lobe and lingula. This is probably related to a prior severe infection she had in infancy. Fortunately, she does not have symptoms of cough or mucus production to suggest that this is clinically significant.  At her last visit  when she ambulated she did  not require oxygen to maintain her oxygen saturation above 94%.  Plan: See outline below    Pulmonary hypertension Her echocardiogram from April 2015 showed a normal left ventricular systolic function, but mildly dilated right heart with preserved RV systolic function but significantly elevated pulmonary pressures. The RVSP was estimated to be 59 mmHg.  Again I reiterated to her and her family that this is only an estimate of pulmonary pressure and that the gold standard would be to perform a right heart catheterization. The differential diagnosis of pulmonary hypertension is broad. In recent years this condition has been diagnosed more frequently in older adults but it is rarely caused by a specific condition that would respond to pulmonary vasodilators. In her particular case the major concern would be underlying pulmonary disease (IE emphysema) as well as possibly obstructive sleep apnea which could contribute. I do not believe at this time given the results of the sleep study and PFTs with CT findings that she has pulmonary arterial hypertension (WHO Class I PAH).   Plan: - We will continue treating her emphysema with Combivent as stated above. - Given that the class I PAH, secondary causes such as OSA and pulmonary disease treatments will be optimized as stated above. - At this time I do not see indication for vasodilators or right heart catheterization. In any case, both the patient and her daughter do want this diagnostic test at this time.    OSA (obstructive sleep apnea) Mild OSA. Discuss results of sleep study in detail with the patient and her daughter. At this time the patient will proceed with an auto Pap titration. Also discussed options of upper airway surgery, and dental appliance. Patient also counseled on diet and weight loss and exercise.  Obesity, unspecified Patient counseled for 15-20 minutes on weight loss, proper diet, and exercise. Good weight loss is half a pound  to 1 pound per week, exercise to include moderate walking 3 times a week 20-30 minutes per event as tolerated; also if having any knee or hip discomfort than the consider water exercises.    Updated Medication List Outpatient Encounter Prescriptions as of 09/07/2013  Medication Sig  . ALPRAZolam (XANAX) 0.5 MG tablet Take 0.5 mg by mouth 3 (three) times daily as needed for anxiety.  Marland Kitchen amLODipine (NORVASC) 10 MG tablet Take 10 mg by mouth daily.  Marland Kitchen aspirin 81 MG tablet Take 81 mg by mouth daily.  . Calcium-Vitamin D (CALTRATE 600 PLUS-VIT D PO) Take 1 tablet by mouth 2 (two) times daily.  . Ipratropium-Albuterol (COMBIVENT RESPIMAT) 20-100 MCG/ACT AERS respimat Inhale 1 puff into the lungs every 6 (six) hours as needed for wheezing or shortness of breath.  . lansoprazole (PREVACID) 30 MG capsule Take 30 mg by mouth daily at 12 noon.  . Multiple Vitamins-Minerals (CENTRUM SILVER ADULT 50+ PO) Take 1 tablet by mouth daily.  Marland Kitchen PARoxetine (PAXIL) 20 MG tablet Take 20 mg by mouth daily.  . polyethylene glycol (MIRALAX / GLYCOLAX) packet Take 17 g by mouth daily.  . potassium chloride (KLOR-CON) 20 MEQ packet Take 20 mEq by mouth daily.  . raloxifene (EVISTA) 60 MG tablet Take 60 mg by mouth daily.  Marland Kitchen torsemide (DEMADEX) 10 MG tablet Take 10 mg by mouth daily.  . vitamin E 400 UNIT capsule Take 400 Units by mouth daily.    Orders for this visit: Orders Placed This Encounter  Procedures  . AMB REFERRAL FOR DME    Referral Priority:  Routine    Referral Type:  Durable Medical Equipment Purchase    Number of Visits Requested:  1    Thank  you for the visitation and for allowing  [facility] and to assist in the care of your patient. Our recommendations are noted above.  Please contact us if we can be of further service.  Vilinda Boehringer, MD [facility] Office Number: [phone]

## 2013-09-07 NOTE — Assessment & Plan Note (Signed)
Her echocardiogram from April 2015 showed a normal left ventricular systolic function, but mildly dilated right heart with preserved RV systolic function but significantly elevated pulmonary pressures. The RVSP was estimated to be 59 mmHg.  Again I reiterated to her and her family that this is only an estimate of pulmonary pressure and that the gold standard would be to perform a right heart catheterization. The differential diagnosis of pulmonary hypertension is broad. In recent years this condition has been diagnosed more frequently in older adults but it is rarely caused by a specific condition that would respond to pulmonary vasodilators. In her particular case the major concern would be underlying pulmonary disease (IE emphysema) as well as possibly obstructive sleep apnea which could contribute. I do not believe at this time given the results of the sleep study and PFTs with CT findings that she has pulmonary arterial hypertension (WHO Class I PAH).   Plan: - We will continue treating her emphysema with Combivent as stated above. - Given that the class I PAH, secondary causes such as OSA and pulmonary disease treatments will be optimized as stated above. - At this time I do not see indication for vasodilators or right heart catheterization. In any case, both the patient and her daughter do want this diagnostic test at this time.

## 2013-09-07 NOTE — Assessment & Plan Note (Signed)
She has mild airflow obstruction consistent with mild COPD. Her CT chest also showed centrilobular emphysema so this is not unexpected. This is due to her prior tobacco use.  This also likely explains at least in part her pulmonary hypertension from echocardiogram and the symptoms of dyspnea  Plan: - Continue with Combivent 4 times a day when necessary shortness of breath. I have reinstated that she use it at least twice a day to see any significant benefit - She be starting Pulmonary rehabilitation on September 1 for deconditioning and shortness of breath

## 2013-09-07 NOTE — Assessment & Plan Note (Signed)
Mild OSA. Discuss results of sleep study in detail with the patient and her daughter. At this time the patient will proceed with an auto Pap titration. Also discussed options of upper airway surgery, and dental appliance. Patient also counseled on diet and weight loss and exercise.

## 2013-09-07 NOTE — Assessment & Plan Note (Signed)
Patient counseled for 15-20 minutes on weight loss, proper diet, and exercise. Good weight loss is half a pound to 1 pound per week, exercise to include moderate walking 3 times a week 20-30 minutes per event as tolerated; also if having any knee or hip discomfort than the consider water exercises.

## 2013-09-10 ENCOUNTER — Ambulatory Visit: Payer: Medicare Other | Admitting: Pulmonary Disease

## 2013-09-11 ENCOUNTER — Encounter (HOSPITAL_BASED_OUTPATIENT_CLINIC_OR_DEPARTMENT_OTHER): Payer: Medicare Other

## 2013-09-18 ENCOUNTER — Institutional Professional Consult (permissible substitution): Payer: Self-pay | Admitting: Pulmonary Disease

## 2013-09-20 ENCOUNTER — Ambulatory Visit: Payer: Medicare Other | Admitting: Pulmonary Disease

## 2013-09-21 ENCOUNTER — Ambulatory Visit: Payer: Self-pay | Admitting: Internal Medicine

## 2013-09-25 ENCOUNTER — Encounter: Payer: Self-pay | Admitting: Pulmonary Disease

## 2013-10-26 ENCOUNTER — Ambulatory Visit: Payer: Self-pay | Admitting: Otolaryngology

## 2013-10-27 ENCOUNTER — Encounter: Payer: Self-pay | Admitting: Pulmonary Disease

## 2013-11-09 ENCOUNTER — Telehealth: Payer: Self-pay | Admitting: Pulmonary Disease

## 2013-11-09 ENCOUNTER — Encounter: Payer: Self-pay | Admitting: *Deleted

## 2013-11-09 NOTE — Telephone Encounter (Signed)
Letter completed and mailed to the pt  I spoke with the pt and notified that this was done  Letter mailed to the pt per her request

## 2013-11-09 NOTE — Telephone Encounter (Signed)
Please send the following letter to her insurance company:  To Whom it May Concern.  Ms. Nicole Arnold has COPD and though she only has mild airflow obstruction she has severe shortness of breath. This makes her a GOLD CLASS C COPD patient based on her symptoms.  Please reconsider paying for her pulmonary rehab as she will benefit greatly from this.  Thanks [employer]

## 2013-11-09 NOTE — Telephone Encounter (Signed)
Called and spoke with pt and she stated that her insurance will only cover the pulmonary rehab if she has moderate COPD to severe COPD.  She stated that the rx that she was given was written that she has mild COPD.  Pt is wanting to go to the pulmonary rehab at the [facility].  BQ please advise. Thanks  Allergies  Allergen Reactions  . Amoxicillin     Mouth/tongue/throat swelling  . Codeine     vomiting  . Neosporin [Neomycin-Bacitracin Zn-Polymyx]   . Sulfa Antibiotics     Current Outpatient Prescriptions on File Prior to Visit  Medication Sig Dispense Refill  . ALPRAZolam (XANAX) 0.5 MG tablet Take 0.5 mg by mouth 3 (three) times daily as needed for anxiety.      Marland Kitchen amLODipine (NORVASC) 10 MG tablet Take 10 mg by mouth daily.      Marland Kitchen aspirin 81 MG tablet Take 81 mg by mouth daily.      . Calcium-Vitamin D (CALTRATE 600 PLUS-VIT D PO) Take 1 tablet by mouth 2 (two) times daily.      . Ipratropium-Albuterol (COMBIVENT RESPIMAT) 20-100 MCG/ACT AERS respimat Inhale 1 puff into the lungs every 6 (six) hours as needed for wheezing or shortness of breath.  3 Inhaler  3  . lansoprazole (PREVACID) 30 MG capsule Take 30 mg by mouth daily at 12 noon.      . Multiple Vitamins-Minerals (CENTRUM SILVER ADULT 50+ PO) Take 1 tablet by mouth daily.      Marland Kitchen PARoxetine (PAXIL) 20 MG tablet Take 20 mg by mouth daily.      . polyethylene glycol (MIRALAX / GLYCOLAX) packet Take 17 g by mouth daily.      . potassium chloride (KLOR-CON) 20 MEQ packet Take 20 mEq by mouth daily.      . raloxifene (EVISTA) 60 MG tablet Take 60 mg by mouth daily.      Marland Kitchen torsemide (DEMADEX) 10 MG tablet Take 10 mg by mouth daily.      . vitamin E 400 UNIT capsule Take 400 Units by mouth daily.       No current facility-administered medications on file prior to visit.

## 2013-11-19 ENCOUNTER — Telehealth: Payer: Self-pay | Admitting: Pulmonary Disease

## 2013-11-19 NOTE — Telephone Encounter (Signed)
lmtcb X1 

## 2013-11-19 NOTE — Telephone Encounter (Signed)
[phone] returning call

## 2013-11-19 NOTE — Telephone Encounter (Signed)
Spoke with pt, she needs letter from 10/16 resent to her as she has not yet received it.  Verified her home address, resent letter.  Nothing further needed.

## 2013-11-25 ENCOUNTER — Encounter: Payer: Self-pay | Admitting: Pulmonary Disease

## 2013-12-25 ENCOUNTER — Encounter: Payer: Self-pay | Admitting: Pulmonary Disease

## 2014-01-07 ENCOUNTER — Ambulatory Visit: Payer: Self-pay | Admitting: Pulmonary Disease

## 2014-01-07 ENCOUNTER — Encounter: Payer: Self-pay | Admitting: Pulmonary Disease

## 2014-01-07 ENCOUNTER — Ambulatory Visit (INDEPENDENT_AMBULATORY_CARE_PROVIDER_SITE_OTHER): Payer: Medicare Other | Admitting: Pulmonary Disease

## 2014-01-07 VITALS — BP 116/64 | HR 66 | Ht 63.0 in | Wt 180.0 lb

## 2014-01-07 DIAGNOSIS — I272 Pulmonary hypertension, unspecified: Secondary | ICD-10-CM

## 2014-01-07 DIAGNOSIS — G4733 Obstructive sleep apnea (adult) (pediatric): Secondary | ICD-10-CM

## 2014-01-07 DIAGNOSIS — M79645 Pain in left finger(s): Secondary | ICD-10-CM

## 2014-01-07 DIAGNOSIS — I270 Primary pulmonary hypertension: Secondary | ICD-10-CM

## 2014-01-07 DIAGNOSIS — J432 Centrilobular emphysema: Secondary | ICD-10-CM

## 2014-01-07 NOTE — Progress Notes (Signed)
Subjective:    Patient ID: Nicole Arnold, female    DOB: Dec 04, 1928, 80 y.o.   MRN: [id number]  Synopsis: First saw [facility] pulmonary in 2015 for evaluation of mild pulmonary hypertension seen on an echocardiogram.  07/2013 PFT> Ratio 56% FEV1 1.56L (96%, 9% change), TLC 3.85L (85% pred), DLCO 9.8 (50% pred)  April 2015 CT chest reviewed by me> there is centrilobular emphysema more prominent in the bases, there is scarring in her left base. There is mild bronchiectasis in the right middle lobe as well as in the lingula. There was mild tree in bud abnormalities in the right upper lobe, there were multiple scattered pulmonary nodules about 5 mm in size  April 2015 echocardiogram> LVEF 60%. Left ventricle is normal in size. RV is normal in size and function but there was a comment of mild dilatation and the RVSP was estimated to be 59 mmHg   HPI Chief Complaint  Patient presents with  . Follow-up    Pt states pulmonary rehab has helped greatly.  States she is having an easier time getting around, decrease in sob.     Haely says that pulmonary rehab has done wonders for her.  She says that her breathing is much easier and her abiliyt to get around the house, sit up, stand up etc are all much better than before she started.  She had a flare of bronchitis about a month ago.  She says that she has been pacing herself more lately and this has really helped her dyspnea.  She slammed her hand in to a cabinet yesterday and she has been experiencing more swelling and pain in the left fifth metatarsal.  She rarely if ever uses her combivent.  She used it twice a day when she had bronchitis and this really helped.  She has been sleeping with the CPAP machine regularly and she feels like it helps.  She says that she has not been too uncomfortable.  No daytime fatigue here lately.   Past Medical History  Diagnosis Date  . Pancreatitis   . Cataract   . Chronic constipation   . GERD  (gastroesophageal reflux disease)   . Osteopenia      Review of Systems  Constitutional: Positive for fatigue. Negative for fever and chills.  HENT: Negative for nosebleeds, postnasal drip, rhinorrhea and sinus pressure.   Respiratory: Negative for cough, shortness of breath and wheezing.   Cardiovascular: Negative for chest pain, palpitations and leg swelling.  Musculoskeletal: Positive for joint swelling and arthralgias.       Objective:   Physical Exam Filed Vitals:   01/07/14 1004  BP: 116/64  Pulse: 66  Height: 5\' 3"  (1.6 m)  Weight: 180 lb (81.647 kg)  SpO2: 95%   Room air  Gen: well appearing, no acute distress HEENT: NCAT, EOMi, OP clear PULM: CTA B  CV: RRR, no mgr, no JVD AB: BS+, soft, nontender,  Ext: warm, no edema, no clubbing, no cyanosis MSK: left hand 5th digit swollen, red, inflamed, tender to touch Derm: no rash or skin breakdown Neuro: A&Ox4, MAEW        Assessment & Plan:   Pain of finger of left hand Based on her description of the injury yesterday in addition to her pain, swelling, and redness of her left fifth digit I'm concerned that she may have broken the 5th digit of her left hand.   Plan: -hand X-ray now  Pulmonary hypertension She has WHO class 3 disease  based on her emphysema and obstructive sleep apnea.  There is no role for a pulmonary vasodilator in this situation.  Plan: -continue to monitor functional status with 6 month 6 min walk -if decline noted in 6 min walk with no other clear reason, then consider RHC  Emphysema lung She has mild COPD and notable emphysema on her CT chest. Plan: Continue as needed combivent for now  OSA (obstructive sleep apnea) She has been very compliant with her CPAP machine and says that her symptoms are well controlled.  Plan: -continue CPAP with autotitrating device -request compliance report download    Updated Medication List Outpatient Encounter Prescriptions as of 01/07/2014    Medication Sig  . ALPRAZolam (XANAX) 0.5 MG tablet Take 0.5 mg by mouth 3 (three) times daily as needed for anxiety.  Marland Kitchen amLODipine (NORVASC) 10 MG tablet Take 10 mg by mouth daily.  Marland Kitchen aspirin 81 MG tablet Take 81 mg by mouth daily.  . Calcium-Vitamin D (CALTRATE 600 PLUS-VIT D PO) Take 1 tablet by mouth 2 (two) times daily.  . Ipratropium-Albuterol (COMBIVENT RESPIMAT) 20-100 MCG/ACT AERS respimat Inhale 1 puff into the lungs every 6 (six) hours as needed for wheezing or shortness of breath.  . lansoprazole (PREVACID) 30 MG capsule Take 30 mg by mouth daily at 12 noon.  . Multiple Vitamins-Minerals (CENTRUM SILVER ADULT 50+ PO) Take 1 tablet by mouth daily.  Marland Kitchen PARoxetine (PAXIL) 20 MG tablet Take 20 mg by mouth daily.  . potassium chloride (KLOR-CON) 20 MEQ packet Take 20 mEq by mouth daily.  . raloxifene (EVISTA) 60 MG tablet Take 60 mg by mouth daily.  Marland Kitchen torsemide (DEMADEX) 10 MG tablet Take 10 mg by mouth daily.  . vitamin E 400 UNIT capsule Take 400 Units by mouth daily.  . [DISCONTINUED] polyethylene glycol (MIRALAX / GLYCOLAX) packet Take 17 g by mouth daily.

## 2014-01-07 NOTE — Assessment & Plan Note (Signed)
She has WHO class 3 disease based on her emphysema and obstructive sleep apnea.  There is no role for a pulmonary vasodilator in this situation.  Plan: -continue to monitor functional status with 6 month 6 min walk -if decline noted in 6 min walk with no other clear reason, then consider RHC

## 2014-01-07 NOTE — Assessment & Plan Note (Signed)
Based on her description of the injury yesterday in addition to her pain, swelling, and redness of her left fifth digit I'm concerned that she may have broken the 5th digit of her left hand.   Plan: -hand X-ray now

## 2014-01-07 NOTE — Assessment & Plan Note (Signed)
She has mild COPD and notable emphysema on her CT chest. Plan: Continue as needed combivent for now

## 2014-01-07 NOTE — Assessment & Plan Note (Signed)
She has been very compliant with her CPAP machine and says that her symptoms are well controlled.  Plan: -continue CPAP with autotitrating device -request compliance report download

## 2014-01-07 NOTE — Patient Instructions (Signed)
We will call you with the result of the finger x-ray Stay active with pulmonary rehab Keep using CPAP every day We will request a download on your CPAP machine We will see you back in 6 months with a 6 minute walk

## 2014-01-25 ENCOUNTER — Encounter: Payer: Self-pay | Admitting: Pulmonary Disease

## 2014-02-25 ENCOUNTER — Encounter: Payer: Self-pay | Admitting: Pulmonary Disease

## 2014-04-15 ENCOUNTER — Encounter: Payer: Self-pay | Admitting: Pulmonary Disease

## 2014-04-15 NOTE — Telephone Encounter (Signed)
[id number] mychart message from pt: Message     please change my E-mail address from  [email or web address]        to: [email or web address]    thanks     [place]    Email address has been updated Unsure if pt will need to update her mychart account to switch her email account - we do not have access to her mychart to do this for her Email response sent to pt informing her of the above

## 2014-05-10 ENCOUNTER — Encounter: Payer: Self-pay | Admitting: Pulmonary Disease

## 2014-06-19 ENCOUNTER — Telehealth: Payer: Self-pay | Admitting: Pulmonary Disease

## 2014-06-19 NOTE — Telephone Encounter (Signed)
Spoke with pt. She does need to see BQ as well as do a SMW. This has been rescheduled to 06/26/14 at 11:30am with ROV with BQ afterwards at 12pm.

## 2014-06-26 ENCOUNTER — Encounter: Payer: Self-pay | Admitting: Pulmonary Disease

## 2014-06-26 ENCOUNTER — Ambulatory Visit (INDEPENDENT_AMBULATORY_CARE_PROVIDER_SITE_OTHER): Payer: Medicare Other | Admitting: Pulmonary Disease

## 2014-06-26 VITALS — BP 126/60 | HR 66 | Ht 63.0 in | Wt 182.0 lb

## 2014-06-26 DIAGNOSIS — I272 Pulmonary hypertension, unspecified: Secondary | ICD-10-CM

## 2014-06-26 DIAGNOSIS — J439 Emphysema, unspecified: Secondary | ICD-10-CM | POA: Diagnosis not present

## 2014-06-26 DIAGNOSIS — G4733 Obstructive sleep apnea (adult) (pediatric): Secondary | ICD-10-CM

## 2014-06-26 DIAGNOSIS — J432 Centrilobular emphysema: Secondary | ICD-10-CM

## 2014-06-26 DIAGNOSIS — I270 Primary pulmonary hypertension: Secondary | ICD-10-CM

## 2014-06-26 DIAGNOSIS — R0609 Other forms of dyspnea: Secondary | ICD-10-CM

## 2014-06-26 NOTE — Assessment & Plan Note (Addendum)
This has been a stable interval. Her 6 minute walk distance has not changed significantly. She has done well with routine exercise. There is no role for a pulmonary basal dilator. If her 6 minute walk drops off significantly or if she starts to have worsening symptoms we will consider right heart catheterization.   Plan: Return to clinic in 6 months with a 6 minute walk

## 2014-06-26 NOTE — Progress Notes (Signed)
Subjective:    Patient ID: Nicole Arnold, female    DOB: 12/29/1928, 81 y.o.   MRN: [id number]  Synopsis: First saw [facility] pulmonary in 2015 for evaluation of mild pulmonary hypertension seen on an echocardiogram.  07/2013 PFT> Ratio 56% FEV1 1.56L (96%, 9% change), TLC 3.85L (85% pred), DLCO 9.8 (50% pred)  April 2015 CT chest reviewed by me> there is centrilobular emphysema more prominent in the bases, there is scarring in her left base. There is mild bronchiectasis in the right middle lobe as well as in the lingula. There was mild tree in bud abnormalities in the right upper lobe, there were multiple scattered pulmonary nodules about 5 mm in size  April 2015 echocardiogram> LVEF 60%. Left ventricle is normal in size. RV is normal in size and function but there was a comment of mild dilatation and the RVSP was estimated to be 59 mmHg   HPI Chief Complaint  Patient presents with  . Follow-up    Pt states she is doing well. She wears CPAP 8 hrs nightly and feels great.She has completed Pulm Rehab.She is not using her Combivent inhaler at all. She just completed SMW today.   Shaolin says that pulmonary rehab has done wonders for her.  She says that her breathing is much easier and her abiliyt to get around the house, sit up, stand up etc are all much better than before she started.  She had a flare of bronchitis about a month ago.  She says that she has been pacing herself more lately and this has really helped her dyspnea.  She slammed her hand in to a cabinet yesterday and she has been experiencing more swelling and pain in the left fifth metatarsal.  She rarely if ever uses her combivent.  She used it twice a day when she had bronchitis and this really helped.  She has been sleeping with the CPAP machine regularly and she feels like it helps.  She says that she has not been too uncomfortable.  No daytime fatigue here lately.   Past Medical History  Diagnosis Date  .  Pancreatitis   . Cataract   . Chronic constipation   . GERD (gastroesophageal reflux disease)   . Osteopenia      Review of Systems  Constitutional: Negative for fever, chills and fatigue.  HENT: Negative for nosebleeds, postnasal drip, rhinorrhea and sinus pressure.   Respiratory: Negative for cough, shortness of breath and wheezing.   Cardiovascular: Negative for chest pain, palpitations and leg swelling.       Objective:   Physical Exam Filed Vitals:   06/26/14 1147  BP: 126/60  Pulse: 66  Height: '5\' 3"'$  (1.6 m)  Weight: 182 lb (82.555 kg)  SpO2: 97%   Room air  Gen: well appearing, no acute distress HEENT: NCAT, EOMi, OP clear PULM: CTA B  CV: RRR, no mgr, no JVD AB: BS+, soft, nontender,  Ext: warm, no edema, no clubbing, no cyanosis Derm: no rash or skin breakdown Neuro: A&Ox4, MAEW        Assessment & Plan:   Pulmonary hypertension This has been a stable interval. Her 6 minute walk distance has not changed significantly. She has done well with routine exercise. There is no role for a pulmonary basal dilator. If her 6 minute walk drops off significantly or if she starts to have worsening symptoms we will consider right heart catheterization.   Plan: Return to clinic in 6 months with a 6  minute walk   OSA (obstructive sleep apnea) Stable interval, continue CPAP nightly   Emphysema lung Stable interval, use Combivent when necessary every 6 hours     Updated Medication List Outpatient Encounter Prescriptions as of 06/26/2014  Medication Sig  . ALPRAZolam (XANAX) 0.5 MG tablet Take 0.5 mg by mouth 3 (three) times daily as needed for anxiety.  Marland Kitchen amLODipine (NORVASC) 10 MG tablet Take 10 mg by mouth daily.  Marland Kitchen aspirin 81 MG tablet Take 81 mg by mouth daily.  . Calcium-Vitamin D (CALTRATE 600 PLUS-VIT D PO) Take 1 tablet by mouth 2 (two) times daily.  . Ipratropium-Albuterol (COMBIVENT RESPIMAT) 20-100 MCG/ACT AERS respimat Inhale 1 puff into the lungs  every 6 (six) hours as needed for wheezing or shortness of breath.  . lansoprazole (PREVACID) 30 MG capsule Take 30 mg by mouth daily at 12 noon.  . Multiple Vitamins-Minerals (CENTRUM SILVER ADULT 50+ PO) Take 1 tablet by mouth daily.  Marland Kitchen PARoxetine (PAXIL) 20 MG tablet Take 20 mg by mouth daily.  . potassium chloride (KLOR-CON) 20 MEQ packet Take 20 mEq by mouth daily.  . raloxifene (EVISTA) 60 MG tablet Take 60 mg by mouth daily.  Marland Kitchen torsemide (DEMADEX) 10 MG tablet Take 10 mg by mouth daily.  . vitamin E 400 UNIT capsule Take 400 Units by mouth daily.   No facility-administered encounter medications on file as of 06/26/2014.

## 2014-06-26 NOTE — Progress Notes (Signed)
Subjective:    Patient ID: Nicole Arnold, female    DOB: 05-28-1928, 81 y.o.   MRN: [id number]  Synopsis: First saw [facility] pulmonary in 2015 for evaluation of mild pulmonary hypertension seen on an echocardiogram.  07/2013 PFT> Ratio 56% FEV1 1.56L (96%, 9% change), TLC 3.85L (85% pred), DLCO 9.8 (50% pred)  April 2015 CT chest reviewed by me> there is centrilobular emphysema more prominent in the bases, there is scarring in her left base. There is mild bronchiectasis in the right middle lobe as well as in the lingula. There was mild tree in bud abnormalities in the right upper lobe, there were multiple scattered pulmonary nodules about 5 mm in size  April 2015 echocardiogram> LVEF 60%. Left ventricle is normal in size. RV is normal in size and function but there was a comment of mild dilatation and the RVSP was estimated to be 59 mmHg   HPI Chief Complaint  Patient presents with  . Follow-up    Pt states she is doing well. She wears CPAP 8 hrs nightly and feels great.She has completed Pulm Rehab.She is not using her Combivent inhaler at all. She just completed SMW today.   marylin says her breathing is better since going to [employer].  She feels like it is better since 12/2013 when she saw me last.  She has been paying more attention to her breathing lately and she has that she has not had to slow down recently.  She is capable of keeping up with her ADL's.  She has been using CPAP regularly and doesn't take it off at all.  She has not been using the combivent at all.    Past Medical History  Diagnosis Date  . Pancreatitis   . Cataract   . Chronic constipation   . GERD (gastroesophageal reflux disease)   . Osteopenia      Review of Systems  Constitutional: Positive for fatigue. Negative for fever and chills.  HENT: Negative for nosebleeds, postnasal drip, rhinorrhea and sinus pressure.   Respiratory: Negative for cough, shortness of breath and wheezing.     Cardiovascular: Negative for chest pain, palpitations and leg swelling.  Musculoskeletal: Positive for joint swelling and arthralgias.       Objective:   Physical Exam Filed Vitals:   06/26/14 1147  BP: 126/60  Pulse: 66  Height: '5\' 3"'$  (1.6 m)  Weight: 182 lb (82.555 kg)  SpO2: 97%   Room air  Gen: well appearing, no acute distress HEENT: NCAT, EOMi, OP clear PULM: CTA B  CV: RRR, no mgr, no JVD AB: BS+, soft, nontender,  Ext: warm, no edema, no clubbing, no cyanosis MSK: left hand 5th digit swollen, red, inflamed, tender to touch Derm: no rash or skin breakdown Neuro: A&Ox4, MAEW        Assessment & Plan:   No problem-specific assessment & plan notes found for this encounter.   Updated Medication List Outpatient Encounter Prescriptions as of 06/26/2014  Medication Sig  . ALPRAZolam (XANAX) 0.5 MG tablet Take 0.5 mg by mouth 3 (three) times daily as needed for anxiety.  Marland Kitchen amLODipine (NORVASC) 10 MG tablet Take 10 mg by mouth daily.  Marland Kitchen aspirin 81 MG tablet Take 81 mg by mouth daily.  . Calcium-Vitamin D (CALTRATE 600 PLUS-VIT D PO) Take 1 tablet by mouth 2 (two) times daily.  . Ipratropium-Albuterol (COMBIVENT RESPIMAT) 20-100 MCG/ACT AERS respimat Inhale 1 puff into the lungs every 6 (six) hours as needed for wheezing  or shortness of breath.  . lansoprazole (PREVACID) 30 MG capsule Take 30 mg by mouth daily at 12 noon.  . Multiple Vitamins-Minerals (CENTRUM SILVER ADULT 50+ PO) Take 1 tablet by mouth daily.  Marland Kitchen PARoxetine (PAXIL) 20 MG tablet Take 20 mg by mouth daily.  . potassium chloride (KLOR-CON) 20 MEQ packet Take 20 mEq by mouth daily.  . raloxifene (EVISTA) 60 MG tablet Take 60 mg by mouth daily.  Marland Kitchen torsemide (DEMADEX) 10 MG tablet Take 10 mg by mouth daily.  . vitamin E 400 UNIT capsule Take 400 Units by mouth daily.   No facility-administered encounter medications on file as of 06/26/2014.

## 2014-06-26 NOTE — Assessment & Plan Note (Signed)
Stable interval, use Combivent when necessary every 6 hours

## 2014-06-26 NOTE — Assessment & Plan Note (Signed)
Stable interval, continue CPAP nightly

## 2014-06-26 NOTE — Patient Instructions (Signed)
Keep exercising every day as you're doing Keep using CPAP every night We will see you back in 6 months or sooner if needed we will do a 6 minute walk on that visit

## 2014-07-12 ENCOUNTER — Ambulatory Visit: Payer: Medicare Other

## 2014-11-26 ENCOUNTER — Other Ambulatory Visit: Payer: Self-pay | Admitting: Internal Medicine

## 2014-11-26 DIAGNOSIS — M79662 Pain in left lower leg: Secondary | ICD-10-CM

## 2014-11-26 DIAGNOSIS — M7989 Other specified soft tissue disorders: Principal | ICD-10-CM

## 2014-11-27 ENCOUNTER — Ambulatory Visit
Admission: RE | Admit: 2014-11-27 | Discharge: 2014-11-27 | Disposition: A | Discharge status: Home / Self Care | Payer: Medicare Other | Source: Ambulatory Visit | Attending: Internal Medicine | Admitting: Internal Medicine

## 2014-11-27 DIAGNOSIS — M7989 Other specified soft tissue disorders: Secondary | ICD-10-CM | POA: Diagnosis present

## 2014-11-27 DIAGNOSIS — M79605 Pain in left leg: Secondary | ICD-10-CM | POA: Diagnosis present

## 2014-11-27 DIAGNOSIS — M79662 Pain in left lower leg: Secondary | ICD-10-CM

## 2014-11-28 ENCOUNTER — Ambulatory Visit: Payer: Medicare Other

## 2014-12-30 ENCOUNTER — Encounter: Payer: Self-pay | Admitting: Internal Medicine

## 2014-12-30 ENCOUNTER — Ambulatory Visit (INDEPENDENT_AMBULATORY_CARE_PROVIDER_SITE_OTHER): Payer: Medicare Other | Admitting: Internal Medicine

## 2014-12-30 ENCOUNTER — Telehealth: Payer: Self-pay | Admitting: *Deleted

## 2014-12-30 VITALS — BP 124/62 | HR 69 | Ht 63.0 in | Wt 190.0 lb

## 2014-12-30 DIAGNOSIS — G4733 Obstructive sleep apnea (adult) (pediatric): Secondary | ICD-10-CM | POA: Diagnosis not present

## 2014-12-30 NOTE — Telephone Encounter (Signed)
Pt calling asking if we can call her back. Wanting to know about some venders for her CPAP machine Please advise.

## 2014-12-30 NOTE — Progress Notes (Signed)
MRN# [id number] Nicole Arnold Mar 19, 1928    Brief Synopsis: First saw [facility] pulmonary in 2015 for evaluation of mild pulmonary hypertension seen on an echocardiogram.  07/2013 PFT> Ratio 56% FEV1 1.56L (96%, 9% change), TLC 3.85L (85% pred), DLCO 9.8 (50% pred)  April 2015 CT chest reviewed by me> there is centrilobular emphysema more prominent in the bases, there is scarring in her left base. There is mild bronchiectasis in the right middle lobe as well as in the lingula. There was mild tree in bud abnormalities in the right upper lobe, there were multiple scattered pulmonary nodules about 5 mm in size  April 2015 echocardiogram> LVEF 60%. Left ventricle is normal in size. RV is normal in size and function but there was a comment of mild dilatation and the RVSP was estimated to be 59 mmHg    CC: Chief Complaint  Patient presents with  . PULMONARY CONSULT    former BQ pt. seen for pulm. hypertension. pt. states breathing has worsen since 10/16 after hurting her foot. SOB. denies wheezing, cough or chest pain/tightness.     HPI Patient states that she has gained weight since her injury to left ankle Still with swelling, [place] lower ext Neg for DVT Has SOB with exertion when she is able to walk She has no wheezing, no fevers, chills She started having SOB after her left ankle injury has gained 8 pounds since then States that her abd is pushing up against her chest wall     Review of Systems: Gen:  Denies  fever, sweats, chills HEENT: Denies blurred vision, double vision, ear pain, eye pain, hearing loss, nose bleeds, sore throat Cvc:  No dizziness, chest pain or heaviness Resp:   Mild sob, no cough or sputum production Ext:   +Joint pain, +stiffness  +swelling Other:  All other systems negative  Allergies:  Amoxicillin; Codeine; Neosporin; and Sulfa antibiotics  Physical Examination:  VS: BP 124/62 mmHg  Pulse 69  Ht '5\' 3"'$  (1.6 m)  Wt 190 lb (86.183 kg)  BMI 33.67  kg/m2  SpO2 98%  General Appearance: No distress  HEENT: PERRLA, EOM intact, no ptosis, no other lesions noticed Pulmonary:Exam: normal breath sounds., diaphragmatic excursion normal.No wheezing, No rales   Cardiovascular:@ Exam:  Normal S1,S2.  No m/r/g.     Extremities:+swelling and tenderness to touch left ankle  Sleep Study Results 07/31/13 AHI:12 Lowest Saturation: 88%  IMPRESSION: Mild obstructive sleep apnea syndrome, with an AHI of 12 events per hour and oxygen desaturation as low as 88 %. Treatment for this degree of sleep apnea can include a trial of weight loss, upper airway surgery, dental appliance, and also CPAP. Clinical correlation is suggested. The decision to treat this degree of sleep apnea should be based upon its impact to the patient's quality of life, since it does represent a significant risk to her cardiovascular health.    Rad results:  ECHO 07/2013  Normal left ventricular systolic function. Mildly dilated right heart with normal RV systolic function and significantly elevated pulmonary pressure; RV estimated at 13mHg.   CT Chest 07/2013 Images reveiwed on 12/30/2014   Assessment and Plan: 81year old female presenting today for followup visit.  Emphysema lung/Dyspnea -will restart Combivent as needed -will get 6MWT and PFT's when patient is able to ambulate   Pulmonary hypertension Her echocardiogram from April 2015 showed a normal left ventricular systolic function, but mildly dilated right heart with preserved RV systolic function but significantly elevated pulmonary pressures. The RVSP  was estimated to be 59 mmHg. I explained to patient that  gold standard for dx would be to perform a right heart catheterization.  In recent years this condition has been diagnosed more frequently in older adults but it is rarely caused by a specific condition that would respond to pulmonary vasodilators. In her particular case the major concern would be underlying pulmonary  disease (IE emphysema) as well as possibly obstructive sleep apnea which could contribute. I do not believe at this time given the results of the sleep study and PFTs with CT findings that she has pulmonary arterial hypertension (WHO Class I PAH).   - We will continue treating her emphysema with Combivent as stated above. - Given that the class I PAH, secondary causes such as OSA and pulmonary disease treatments will be optimized as stated above. - At this time I do not see indication for vasodilators or right heart catheterization.   OSA (obstructive sleep apnea) -continue CPAP as prescribed  H/o Pulm Nodules -will obtain Ct chest at next visit in 6 months  I have personally obtained a history, examined the patient, evaluated Pertinent laboratory and RadioGraphic/imaging results, and  formulated the assessment and plan The Patient requires high complexity decision making for assessment and support, frequent evaluation and titration of therapies. Patient satisfied with Plan of action and management. All questions answered  Corrin Parker, M.D.  [facility]  Medical Director [place] Director [facility] Cardio-Pulmonary Department

## 2014-12-30 NOTE — Patient Instructions (Signed)

## 2014-12-30 NOTE — Telephone Encounter (Signed)
Looks like this needs to go to pulmonary.

## 2015-01-01 ENCOUNTER — Telehealth: Payer: Self-pay | Admitting: Internal Medicine

## 2015-01-01 NOTE — Telephone Encounter (Signed)
Called pt. She was calling to see where her CPAP order was sent to. I advised her it was sent to sleep [facility]. She then stated they did contact her but then told her they would contact her back.  She wanted to ensure this is where her order was sent to. I confirmed it was. Nothing further needed

## 2015-01-01 NOTE — Telephone Encounter (Signed)
See phone note dated 01/01/15

## 2015-01-01 NOTE — Telephone Encounter (Signed)
Attempted to contact patient, message says "your party is not answering please try your call again"  Will call back.

## 2015-01-01 NOTE — Telephone Encounter (Signed)
See below

## 2015-01-02 NOTE — Telephone Encounter (Signed)
Pt wanted to confirm name of company we sent order to for her supplies. Nothing further needed.

## 2015-01-06 ENCOUNTER — Other Ambulatory Visit: Payer: Self-pay | Admitting: Internal Medicine

## 2015-01-06 DIAGNOSIS — Z1231 Encounter for screening mammogram for malignant neoplasm of breast: Secondary | ICD-10-CM

## 2015-01-16 ENCOUNTER — Ambulatory Visit: Payer: Medicare Other

## 2015-01-28 ENCOUNTER — Telehealth: Payer: Self-pay | Admitting: Pulmonary Disease

## 2015-01-28 NOTE — Telephone Encounter (Signed)
Spoke with pt. She needs a copy of her sleep study from 07/2013 signed by BQ and faxed to [phone]. This has been printed and given to [place] to have BQ to sign. It will be faxed once it's signed.

## 2015-01-29 ENCOUNTER — Other Ambulatory Visit: Payer: Self-pay | Admitting: Internal Medicine

## 2015-01-29 ENCOUNTER — Ambulatory Visit
Admission: RE | Admit: 2015-01-29 | Discharge: 2015-01-29 | Disposition: A | Discharge status: Home / Self Care | Payer: Medicare Other | Source: Ambulatory Visit | Attending: Internal Medicine | Admitting: Internal Medicine

## 2015-01-29 DIAGNOSIS — Z1231 Encounter for screening mammogram for malignant neoplasm of breast: Secondary | ICD-10-CM

## 2015-02-06 ENCOUNTER — Telehealth: Payer: Self-pay

## 2015-02-06 NOTE — Telephone Encounter (Signed)
Caryl Pina,  Will you get BQ to sign sleep study for [facility] per phone note on 01/28/15. [facility] doesn't do sleep and DR has never seen the pt. [facility] has called here today asking for it to be signed. Thanks.

## 2015-02-06 NOTE — Telephone Encounter (Signed)
Alternate # [phone] Juliann Pulse) Needs signature on sleep study. Please study

## 2015-02-07 NOTE — Telephone Encounter (Signed)
[facility], office was closed.  wcb Monday morning.

## 2015-02-07 NOTE — Telephone Encounter (Signed)
VS please advise if you're willing to read and sign pt's sleep study.  Thanks.

## 2015-02-07 NOTE — Telephone Encounter (Signed)
Her sleep study from 07/31/13 was reviewed, read and signed by Dr. Gwenette Greet.  It shows mild obstructive sleep apnea with AHI 12 and SpO2 88%.  Who is sleep med, and why do they need to have sleep study re-read and signed again?

## 2015-02-07 NOTE — Telephone Encounter (Signed)
BQ please advise if you're willing to sign this patient's sleep study.  Thanks!

## 2015-02-07 NOTE — Telephone Encounter (Signed)
Do I need to sign for the order? I don't read asleep studies

## 2015-02-18 ENCOUNTER — Telehealth: Payer: Self-pay | Admitting: Pulmonary Disease

## 2015-02-18 NOTE — Telephone Encounter (Signed)
Spoke with pt. States that she wants [place] to disregard this message. Will sign off.

## 2015-03-24 ENCOUNTER — Other Ambulatory Visit: Payer: Self-pay | Admitting: Gastroenterology

## 2015-03-24 DIAGNOSIS — K862 Cyst of pancreas: Secondary | ICD-10-CM

## 2015-03-31 ENCOUNTER — Telehealth: Payer: Self-pay | Admitting: Pulmonary Disease

## 2015-03-31 DIAGNOSIS — I272 Pulmonary hypertension, unspecified: Secondary | ICD-10-CM

## 2015-03-31 DIAGNOSIS — G4733 Obstructive sleep apnea (adult) (pediatric): Secondary | ICD-10-CM

## 2015-03-31 NOTE — Telephone Encounter (Signed)
Patient states that she needs new mask, filter and tubing. Patient needs new DME, former DME company is out of service.  Would like to have referral for [facility] again.  Patient said that she did very well, but had Pneumonia in December and has had trouble getting back on her feet again.   Order entered for new CPAP mask, filter, tubing and new DME company along with mask fitting.  Dr. Lake Bells, please advise if ok for referral to [facility].

## 2015-04-01 ENCOUNTER — Other Ambulatory Visit: Payer: Self-pay | Admitting: Internal Medicine

## 2015-04-01 ENCOUNTER — Ambulatory Visit
Admission: RE | Admit: 2015-04-01 | Discharge: 2015-04-01 | Disposition: A | Discharge status: Home / Self Care | Payer: Medicare Other | Source: Ambulatory Visit | Attending: Internal Medicine | Admitting: Internal Medicine

## 2015-04-01 DIAGNOSIS — J90 Pleural effusion, not elsewhere classified: Secondary | ICD-10-CM | POA: Insufficient documentation

## 2015-04-01 DIAGNOSIS — X58XXXA Exposure to other specified factors, initial encounter: Secondary | ICD-10-CM | POA: Insufficient documentation

## 2015-04-01 DIAGNOSIS — R0781 Pleurodynia: Secondary | ICD-10-CM

## 2015-04-01 DIAGNOSIS — M7989 Other specified soft tissue disorders: Secondary | ICD-10-CM

## 2015-04-01 DIAGNOSIS — S2231XA Fracture of one rib, right side, initial encounter for closed fracture: Secondary | ICD-10-CM | POA: Diagnosis not present

## 2015-04-01 DIAGNOSIS — R918 Other nonspecific abnormal finding of lung field: Secondary | ICD-10-CM | POA: Diagnosis not present

## 2015-04-01 DIAGNOSIS — K869 Disease of pancreas, unspecified: Secondary | ICD-10-CM | POA: Diagnosis not present

## 2015-04-01 DIAGNOSIS — R590 Localized enlarged lymph nodes: Secondary | ICD-10-CM | POA: Insufficient documentation

## 2015-04-01 MED ORDER — IOHEXOL 350 MG/ML SOLN
100.0000 mL | Freq: Once | INTRAVENOUS | Status: AC | PRN
  Administered 2015-04-01: 100 mL via INTRAVENOUS

## 2015-04-01 NOTE — Telephone Encounter (Signed)
Order placed for [facility].   Pt aware.  Nothing further needed.

## 2015-04-01 NOTE — Telephone Encounter (Signed)
OK by me to refer to [facility]

## 2015-04-02 ENCOUNTER — Ambulatory Visit: Payer: Medicare Other

## 2015-04-02 ENCOUNTER — Other Ambulatory Visit: Payer: Self-pay | Admitting: Internal Medicine

## 2015-04-02 DIAGNOSIS — K8689 Other specified diseases of pancreas: Secondary | ICD-10-CM

## 2015-04-28 ENCOUNTER — Telehealth: Payer: Self-pay | Admitting: Pulmonary Disease

## 2015-04-28 NOTE — Telephone Encounter (Signed)
Referral was faxed unsuccessfully on Friday- per [facility] this has been refaxed.  Nothing further needed.

## 2015-05-13 ENCOUNTER — Encounter: Payer: Medicare Other | Attending: Pulmonary Disease | Admitting: Respiratory Therapy

## 2015-05-13 ENCOUNTER — Encounter: Payer: Self-pay | Admitting: Respiratory Therapy

## 2015-05-13 VITALS — Ht 63.0 in | Wt 188.8 lb

## 2015-05-13 DIAGNOSIS — I2721 Secondary pulmonary arterial hypertension: Secondary | ICD-10-CM

## 2015-05-13 DIAGNOSIS — G4730 Sleep apnea, unspecified: Secondary | ICD-10-CM | POA: Insufficient documentation

## 2015-05-13 DIAGNOSIS — I272 Other secondary pulmonary hypertension: Secondary | ICD-10-CM | POA: Diagnosis present

## 2015-05-13 NOTE — Patient Instructions (Signed)
Patient Instructions  Patient Details  Name: Nicole Arnold MRN: [id number] Date of Birth: Oct 04, 1928 Referring Provider:  Juanito Doom, MD  Below are the personal goals you chose as well as exercise and nutrition goals. Our goal is to help you keep on track towards obtaining and maintaining your goals. We will be discussing your progress on these goals with you throughout the program.  Initial Exercise Prescription:     Initial Exercise Prescription - 05/13/15 1000    Date of Initial Exercise RX and Referring Provider   Date 05/13/15   Treadmill   MPH 2   Grade 0   Minutes 15   NuStep   Level 2   Watts 40   Minutes 15   Recumbant Elliptical   Level 2   RPM 40   Watts 20   Minutes 15   REL-XR   Level 2   Watts 50   Minutes 15   T5 Nustep   Level 1   Watts 50   Minutes 15   Biostep-RELP   Level 1   Watts 15   Minutes 15   Prescription Details   Frequency (times per week) 3   Duration Progress to 50 minutes of aerobic without signs/symptoms of physical distress   Intensity   THRR REST +  30   Ratings of Perceived Exertion 11-15   Perceived Dyspnea 0-4   Progression   Progression Continue to progress workloads to maintain intensity without signs/symptoms of physical distress.   Resistance Training   Training Prescription Yes   Weight 2   Reps 10-15      Exercise Goals: Frequency: Be able to perform aerobic exercise three times per week working toward 3-5 days per week.  Intensity: Work with a perceived exertion of 11 (fairly light) - 15 (hard) as tolerated. Follow your new exercise prescription and watch for changes in prescription as you progress with the program. Changes will be reviewed with you when they are made.  Duration: You should be able to do 30 minutes of continuous aerobic exercise in addition to a 5 minute warm-up and a 5 minute cool-down routine.  Nutrition Goals: Your personal nutrition goals will be established when you do your  nutrition analysis with the dietician.  The following are nutrition guidelines to follow: Cholesterol < '200mg'$ /day Sodium < '1500mg'$ /day Fiber: Women over 50 yrs - 21 grams per day  Personal Goals:     Personal Goals and Risk Factors at Admission - 05/13/15 0905    Core Components/Risk Factors/Patient Goals on Admission    Weight Management Yes;Weight Loss   Intervention Weight Management: Develop a combined nutrition and exercise program designed to reach desired caloric intake, while maintaining appropriate intake of nutrient and fiber, sodium and fats, and appropriate energy expenditure required for the weight goal.;Weight Management: Provide education and appropriate resources to help participant work on and attain dietary goals.;Weight Management/Obesity: Establish reasonable short term and long term weight goals.;Obesity: Provide education and appropriate resources to help participant work on and attain dietary goals.   Admit Weight 189 lb (85.73 kg)   Goal Weight: Short Term 184 lb (83.462 kg)   Goal Weight: Long Term 159 lb (72.122 kg)   Expected Outcomes Short Term: Continue to assess and modify interventions until short term weight is achieved;Long Term: Adherence to nutrition and physical activity/exercise program aimed toward attainment of established weight goal;Weight Maintenance: Understanding of the daily nutrition guidelines, which includes 25-35% calories from fat, 7% or less cal  from saturated fats, less than '200mg'$  cholesterol, less than 1.5gm of sodium, & 5 or more servings of fruits and vegetables daily;Weight Loss: Understanding of general recommendations for a balanced deficit meal plan, which promotes 1-2 lb weight loss per week and includes a negative energy balance of [phone] kcal/d;Understanding recommendations for meals to include 15-35% energy as protein, 25-35% energy from fat, 35-60% energy from carbohydrates, less than '200mg'$  of dietary cholesterol, 20-35 gm of total  fiber daily;Understanding of distribution of calorie intake throughout the day with the consumption of 4-5 meals/snacks;Weight Gain: Understanding of general recommendations for a high calorie, high protein meal plan that promotes weight gain by distributing calorie intake throughout the day with the consumption for 4-5 meals, snacks, and/or supplements   Sedentary Yes   Intervention Provide advice, education, support and counseling about physical activity/exercise needs.;Develop an individualized exercise prescription for aerobic and resistive training based on initial evaluation findings, risk stratification, comorbidities and participant's personal goals.   Expected Outcomes Achievement of increased cardiorespiratory fitness and enhanced flexibility, muscular endurance and strength shown through measurements of functional capacity and personal statement of participant.   Increase Strength and Stamina Yes   Intervention Provide advice, education, support and counseling about physical activity/exercise needs.;Develop an individualized exercise prescription for aerobic and resistive training based on initial evaluation findings, risk stratification, comorbidities and participant's personal goals.   Expected Outcomes Achievement of increased cardiorespiratory fitness and enhanced flexibility, muscular endurance and strength shown through measurements of functional capacity and personal statement of participant.   Improve shortness of breath with ADL's Yes   Intervention Provide education, individualized exercise plan and daily activity instruction to help decrease symptoms of SOB with activities of daily living.   Expected Outcomes Short Term: Achieves a reduction of symptoms when performing activities of daily living.   Develop more efficient breathing techniques such as purse lipped breathing and diaphragmatic breathing; and practicing self-pacing with activity Yes   Intervention Provide education,  demonstration and support about specific breathing techniuqes utilized for more efficient breathing. Include techniques such as pursed lipped breathing, diaphragmatic breathing and self-pacing activity.   Expected Outcomes Short Term: Participant will be able to demonstrate and use breathing techniques as needed throughout daily activities.   Increase knowledge of respiratory medications and ability to use respiratory devices properly  Yes   Intervention Provide education and demonstration as needed of appropriate use of medications, inhalers, and oxygen therapy.  Ms Wanek has Symbicort Respimat as needed and rarely uses; CPAP with [place] -full face mask+   Expected Outcomes Short Term: Achieves understanding of medications use. Understands that oxygen is a medication prescribed by physician. Demonstrates appropriate use of inhaler and oxygen therapy.      Tobacco Use Initial Evaluation: History  Smoking status   Former Smoker -- 0.50 packs/day for 30 years   Types: Cigarettes   Quit date: 01/25/1993  Smokeless tobacco   Never Used    Copy of goals given to participant.

## 2015-05-13 NOTE — Progress Notes (Signed)
Pulmonary Individual Treatment Plan  Patient Details  Name: Nicole Arnold MRN: [id number] Date of Birth: 1928/06/16 Referring Provider:  Dr Simonne Maffucci  Initial Encounter Date:       Pulmonary Rehab from 05/13/2015 in [facility] Cardiac and Pulmonary Rehab   Date  05/13/15      Visit Diagnosis: Sleep apnea  Pulmonary arterial hypertension ([place])  Patient's Home Medications on Admission:  Current outpatient prescriptions:    ALPRAZolam (XANAX) 0.5 MG tablet, Take 0.5 mg by mouth 3 (three) times daily as needed for anxiety., Disp: , Rfl:    amLODipine (NORVASC) 10 MG tablet, Take 10 mg by mouth daily., Disp: , Rfl:    aspirin 81 MG tablet, Take 81 mg by mouth daily., Disp: , Rfl:    Calcium-Vitamin D (CALTRATE 600 PLUS-VIT D PO), Take 1 tablet by mouth 2 (two) times daily., Disp: , Rfl:    Ipratropium-Albuterol (COMBIVENT RESPIMAT) 20-100 MCG/ACT AERS respimat, Inhale 1 puff into the lungs every 6 (six) hours as needed for wheezing or shortness of breath., Disp: 3 Inhaler, Rfl: 3   lansoprazole (PREVACID) 30 MG capsule, Take 30 mg by mouth daily at 12 noon., Disp: , Rfl:    Multiple Vitamins-Minerals (CENTRUM SILVER ADULT 50+ PO), Take 1 tablet by mouth daily., Disp: , Rfl:    PARoxetine (PAXIL) 20 MG tablet, Take 20 mg by mouth daily., Disp: , Rfl:    raloxifene (EVISTA) 60 MG tablet, Take 60 mg by mouth daily., Disp: , Rfl:    vitamin E 400 UNIT capsule, Take 400 Units by mouth daily., Disp: , Rfl:   Past Medical History: Past Medical History  Diagnosis Date   Pancreatitis    Cataract    Chronic constipation    GERD (gastroesophageal reflux disease)    Osteopenia     Tobacco Use: History  Smoking status   Former Smoker -- 0.50 packs/day for 30 years   Types: Cigarettes   Quit date: 01/25/1993  Smokeless tobacco   Never Used    Labs: Recent Review Flowsheet Data    There is no flowsheet data to display.       ADL [facility]:     Pulmonary  Assessment Scores      05/13/15 0905       ADL [facility]   ADL Phase Entry     SOB Score total 48     Rest 0     Walk 2     Stairs 5     Bath 4     Dress 3     Shop 3        Pulmonary Function Assessment:     Pulmonary Function Assessment - 05/13/15 0905    Pulmonary Function Tests   RV% 49 %   DLCO% 50 %   Initial Spirometry Results   FVC% 119 %   FEV1% 88 %   FEV1/FVC Ratio 50   Comments Test date 07/31/2013   Post Bronchodilator Spirometry Results   FVC% 115 %   FEV1% 96 %   FEV1/FVC Ratio 56   Breath   Bilateral Breath Sounds Clear   Shortness of Breath Yes;Fear of Shortness of Breath;Limiting activity      Exercise Target Goals: Date: 05/13/15  Exercise Program Goal: Individual exercise prescription set with THRR, safety & activity barriers. Participant demonstrates ability to understand and report RPE using BORG scale, to self-measure pulse accurately, and to acknowledge the importance of the exercise prescription.  Exercise Prescription Goal: Starting with aerobic  activity 30 plus minutes a day, 3 days per week for initial exercise prescription. Provide home exercise prescription and guidelines that participant acknowledges understanding prior to discharge.  Activity Barriers & Risk Stratification:     Activity Barriers & Cardiac Risk Stratification - 05/13/15 0905    Activity Barriers & Cardiac Risk Stratification   Activity Barriers Shortness of Breath;Deconditioning;Muscular Weakness   Cardiac Risk Stratification Moderate      6 Minute Walk:     6 Minute Walk      05/13/15 1024       6 Minute Walk   Phase Initial     Distance 1175 feet     Walk Time 6 minutes     RPE 17     Perceived Dyspnea  4     Resting HR 64 bpm     Resting BP 134/70 mmHg     Max Ex. HR 115 bpm     Max Ex. BP 178/70 mmHg        Initial Exercise Prescription:     Initial Exercise Prescription - 05/13/15 1000    Date of Initial Exercise RX and Referring Provider    Date 05/13/15   Treadmill   MPH 2   Grade 0   Minutes 15   NuStep   Level 2   Watts 40   Minutes 15   Recumbant Elliptical   Level 2   RPM 40   Watts 20   Minutes 15   REL-XR   Level 2   Watts 50   Minutes 15   T5 Nustep   Level 1   Watts 50   Minutes 15   Biostep-RELP   Level 1   Watts 15   Minutes 15   Prescription Details   Frequency (times per week) 3   Duration Progress to 50 minutes of aerobic without signs/symptoms of physical distress   Intensity   THRR REST +  30   Ratings of Perceived Exertion 11-15   Perceived Dyspnea 0-4   Progression   Progression Continue to progress workloads to maintain intensity without signs/symptoms of physical distress.   Resistance Training   Training Prescription Yes   Weight 2   Reps 10-15      Perform Capillary Blood Glucose checks as needed.  Exercise Prescription Changes:   Exercise Comments:   Discharge Exercise Prescription (Final Exercise Prescription Changes):    Nutrition:  Target Goals: Understanding of nutrition guidelines, daily intake of sodium '1500mg'$ , cholesterol '200mg'$ , calories 30% from fat and 7% or less from saturated fats, daily to have 5 or more servings of fruits and vegetables.  Biometrics:     Pre Biometrics - 05/13/15 1031    Pre Biometrics   Height '5\' 3"'$  (1.6 m)   Weight 188 lb 12.8 oz (85.639 kg)   Waist Circumference 38 inches   Hip Circumference 46 inches   Waist to Hip Ratio 0.83 %   BMI (Calculated) 33.5       Nutrition Therapy Plan and Nutrition Goals:     Nutrition Therapy & Goals - 05/13/15 0905    Nutrition Therapy   Diet Prefers not to meet with the dietitian; [name] cooks healthy, she does like sweets; low salt and less meats.      Nutrition Discharge: Rate Your Plate Scores:   Psychosocial: Target Goals: Acknowledge presence or absence of depression, maximize coping skills, provide positive support system. Participant is able to verbalize types and  ability to use techniques and  skills needed for reducing stress and depression.  Initial Review & Psychosocial Screening:     Initial Psych Review & Screening - 05/13/15 [place]? Yes   Comments [name] has good family support from her husband and daughter who is a Marine scientist. Her main concern is her husband and his lack of activity and socialization. Ms Oconnor loves people  and does have an active social life.   Barriers   Psychosocial barriers to participate in program The patient should benefit from training in stress management and relaxation.   Screening Interventions   Interventions Encouraged to exercise;Program counselor consult      Quality of Life Scores:     Quality of Life - 05/13/15 0905    Quality of Life Scores   Health/Function Pre 25.2 %   Socioeconomic Pre 30 %   Psych/Spiritual Pre 30 %   Family Pre 28.8 %   GLOBAL Pre 27.71 %      PHQ-9:     Recent Review Flowsheet Data    Depression screen [facility] 2/9 05/13/2015   Decreased Interest 0   Down, Depressed, Hopeless 0   PHQ - 2 Score 0   Altered sleeping 0   Tired, decreased energy 2   Change in appetite 0   Feeling bad or failure about yourself  0   Trouble concentrating 0   Moving slowly or fidgety/restless 0   Suicidal thoughts 0   PHQ-9 Score 2   Difficult doing work/chores Somewhat difficult      Psychosocial Evaluation and Intervention:   Psychosocial Re-Evaluation:  Education: Education Goals: Education classes will be provided on a weekly basis, covering required topics. Participant will state understanding/return demonstration of topics presented.  Learning Barriers/Preferences:     Learning Barriers/Preferences - 05/13/15 2703    Learning Barriers/Preferences   Learning Barriers None   Learning Preferences Group Instruction;Individual Instruction;Pictoral;Skilled Demonstration;Verbal Instruction;Video;Written Material      Education  Topics: Initial Evaluation Education: - Verbal, written and demonstration of respiratory meds, RPE/PD scales, oximetry and breathing techniques. Instruction on use of nebulizers and MDIs: cleaning and proper use, rinsing mouth with steroid doses and importance of monitoring MDI activations.          Pulmonary Rehab from 05/13/2015 in [facility] Cardiac and Pulmonary Rehab   Date  05/13/15   Educator  LB   Instruction Review Code  2- meets goals/outcomes      General Nutrition Guidelines/Fats and Fiber: -Group instruction provided by verbal, written material, models and posters to present the general guidelines for heart healthy nutrition. Gives an explanation and review of dietary fats and fiber.   Controlling Sodium/Reading Food Labels: -Group verbal and written material supporting the discussion of sodium use in heart healthy nutrition. Review and explanation with models, verbal and written materials for utilization of the food label.   Exercise Physiology & Risk Factors: - Group verbal and written instruction with models to review the exercise physiology of the cardiovascular system and associated critical values. Details cardiovascular disease risk factors and the goals associated with each risk factor.      Pulmonary Rehab from 05/13/2015 in [facility] Cardiac and Pulmonary Rehab   Date  05/13/15   Educator  LB2      Aerobic Exercise & Resistance Training: - Gives group verbal and written discussion on the health impact of inactivity. On the components of aerobic and resistive training programs and the benefits of this training and how  to safely progress through these programs.   Flexibility, Balance, General Exercise Guidelines: - Provides group verbal and written instruction on the benefits of flexibility and balance training programs. Provides general exercise guidelines with specific guidelines to those with heart or lung disease. Demonstration and skill practice provided.   Stress  Management: - Provides group verbal and written instruction about the health risks of elevated stress, cause of high stress, and healthy ways to reduce stress.   Depression: - Provides group verbal and written instruction on the correlation between heart/lung disease and depressed mood, treatment options, and the stigmas associated with seeking treatment.   Exercise & Equipment Safety: - Individual verbal instruction and demonstration of equipment use and safety with use of the equipment.   Infection Prevention: - Provides verbal and written material to individual with discussion of infection control including proper hand washing and proper equipment cleaning during exercise session.   Falls Prevention: - Provides verbal and written material to individual with discussion of falls prevention and safety.      Pulmonary Rehab from 05/13/2015 in [facility] Cardiac and Pulmonary Rehab   Date  05/13/15   Educator  LB   Instruction Review Code  2- meets goals/outcomes      Diabetes: - Individual verbal and written instruction to review signs/symptoms of diabetes, desired ranges of glucose level fasting, after meals and with exercise. Advice that pre and post exercise glucose checks will be done for 3 sessions at entry of program.   Chronic Lung Diseases: - Group verbal and written instruction to review new updates, new respiratory medications, new advancements in procedures and treatments. Provide informative websites and "800" numbers of self-education.   Lung Procedures: - Group verbal and written instruction to describe testing methods done to diagnose lung disease. Review the outcome of test results. Describe the treatment choices: Pulmonary Function Tests, ABGs and oximetry.   Energy Conservation: - Provide group verbal and written instruction for methods to conserve energy, plan and organize activities. Instruct on pacing techniques, use of adaptive equipment and posture/positioning to  relieve shortness of breath.   Triggers: - Group verbal and written instruction to review types of environmental controls: home humidity, furnaces, filters, dust mite/pet prevention, HEPA vacuums. To discuss weather changes, air quality and the benefits of nasal washing.   Exacerbations: - Group verbal and written instruction to provide: warning signs, infection symptoms, calling MD promptly, preventive modes, and value of vaccinations. Review: effective airway clearance, coughing and/or vibration techniques. Create an Sports administrator.   Oxygen: - Individual and group verbal and written instruction on oxygen therapy. Includes supplement oxygen, available portable oxygen systems, continuous and intermittent flow rates, oxygen safety, concentrators, and Medicare reimbursement for oxygen.   Respiratory Medications: - Group verbal and written instruction to review medications for lung disease. Drug class, frequency, complications, importance of spacers, rinsing mouth after steroid MDI's, and proper cleaning methods for nebulizers.   AED/CPR: - Group verbal and written instruction with the use of models to demonstrate the basic use of the AED with the basic ABC's of resuscitation.   Breathing Retraining: - Provides individuals verbal and written instruction on purpose, frequency, and proper technique of diaphragmatic breathing and pursed-lipped breathing. Applies individual practice skills.      Pulmonary Rehab from 05/13/2015 in [facility] Cardiac and Pulmonary Rehab   Date  05/13/15   Educator  LB   Instruction Review Code  2- meets goals/outcomes      Anatomy and Physiology of the Lungs: - Group verbal  and written instruction with the use of models to provide basic lung anatomy and physiology related to function, structure and complications of lung disease.   Heart Failure: - Group verbal and written instruction on the basics of heart failure: signs/symptoms, treatments, explanation of  ejection fraction, enlarged heart and cardiomyopathy.   Sleep Apnea: - Individual verbal and written instruction to review Obstructive Sleep Apnea. Review of risk factors, methods for diagnosing and types of masks and machines for OSA.   Anxiety: - Provides group, verbal and written instruction on the correlation between heart/lung disease and anxiety, treatment options, and management of anxiety.   Relaxation: - Provides group, verbal and written instruction about the benefits of relaxation for patients with heart/lung disease. Also provides patients with examples of relaxation techniques.   Knowledge Questionnaire Score:     Knowledge Questionnaire Score - 05/13/15 0905    Knowledge Questionnaire Score   Pre Score 6/10       Core Components/Risk Factors/Patient Goals at Admission:     Personal Goals and Risk Factors at Admission - 05/13/15 0905    Core Components/Risk Factors/Patient Goals on Admission    Weight Management Yes;Weight Loss   Intervention Weight Management: Develop a combined nutrition and exercise program designed to reach desired caloric intake, while maintaining appropriate intake of nutrient and fiber, sodium and fats, and appropriate energy expenditure required for the weight goal.;Weight Management: Provide education and appropriate resources to help participant work on and attain dietary goals.;Weight Management/Obesity: Establish reasonable short term and long term weight goals.;Obesity: Provide education and appropriate resources to help participant work on and attain dietary goals.   Admit Weight 189 lb (85.73 kg)   Goal Weight: Short Term 184 lb (83.462 kg)   Goal Weight: Long Term 159 lb (72.122 kg)   Expected Outcomes Short Term: Continue to assess and modify interventions until short term weight is achieved;Long Term: Adherence to nutrition and physical activity/exercise program aimed toward attainment of established weight goal;Weight Maintenance:  Understanding of the daily nutrition guidelines, which includes 25-35% calories from fat, 7% or less cal from saturated fats, less than '200mg'$  cholesterol, less than 1.5gm of sodium, & 5 or more servings of fruits and vegetables daily;Weight Loss: Understanding of general recommendations for a balanced deficit meal plan, which promotes 1-2 lb weight loss per week and includes a negative energy balance of [phone] kcal/d;Understanding recommendations for meals to include 15-35% energy as protein, 25-35% energy from fat, 35-60% energy from carbohydrates, less than '200mg'$  of dietary cholesterol, 20-35 gm of total fiber daily;Understanding of distribution of calorie intake throughout the day with the consumption of 4-5 meals/snacks;Weight Gain: Understanding of general recommendations for a high calorie, high protein meal plan that promotes weight gain by distributing calorie intake throughout the day with the consumption for 4-5 meals, snacks, and/or supplements   Sedentary Yes   Intervention Provide advice, education, support and counseling about physical activity/exercise needs.;Develop an individualized exercise prescription for aerobic and resistive training based on initial evaluation findings, risk stratification, comorbidities and participant's personal goals.   Expected Outcomes Achievement of increased cardiorespiratory fitness and enhanced flexibility, muscular endurance and strength shown through measurements of functional capacity and personal statement of participant.   Increase Strength and Stamina Yes   Intervention Provide advice, education, support and counseling about physical activity/exercise needs.;Develop an individualized exercise prescription for aerobic and resistive training based on initial evaluation findings, risk stratification, comorbidities and participant's personal goals.   Expected Outcomes Achievement of increased cardiorespiratory fitness and enhanced  flexibility, muscular  endurance and strength shown through measurements of functional capacity and personal statement of participant.   Improve shortness of breath with ADL's Yes   Intervention Provide education, individualized exercise plan and daily activity instruction to help decrease symptoms of SOB with activities of daily living.   Expected Outcomes Short Term: Achieves a reduction of symptoms when performing activities of daily living.   Develop more efficient breathing techniques such as purse lipped breathing and diaphragmatic breathing; and practicing self-pacing with activity Yes   Intervention Provide education, demonstration and support about specific breathing techniuqes utilized for more efficient breathing. Include techniques such as pursed lipped breathing, diaphragmatic breathing and self-pacing activity.   Expected Outcomes Short Term: Participant will be able to demonstrate and use breathing techniques as needed throughout daily activities.   Increase knowledge of respiratory medications and ability to use respiratory devices properly  Yes   Intervention Provide education and demonstration as needed of appropriate use of medications, inhalers, and oxygen therapy.  Ms Sedlak has Symbicort Respimat as needed and rarely uses; CPAP with [place] -full face mask+   Expected Outcomes Short Term: Achieves understanding of medications use. Understands that oxygen is a medication prescribed by physician. Demonstrates appropriate use of inhaler and oxygen therapy.      Core Components/Risk Factors/Patient Goals Review:    Core Components/Risk Factors/Patient Goals at Discharge (Final Review):    ITP Comments:   Comments: Ms Piggott starts [facility] on 05/18/2012 and will exercise 3 days/week.

## 2015-05-19 ENCOUNTER — Encounter: Payer: Medicare Other | Admitting: *Deleted

## 2015-05-19 DIAGNOSIS — G4730 Sleep apnea, unspecified: Secondary | ICD-10-CM | POA: Diagnosis not present

## 2015-05-19 DIAGNOSIS — I2721 Secondary pulmonary arterial hypertension: Secondary | ICD-10-CM

## 2015-05-19 NOTE — Progress Notes (Signed)
Daily Session Note  Patient Details  Name: RESSIE SLEVIN MRN: [id number] Date of Birth: [birth year] Referring Provider:    Encounter Date: 05/19/2015  Check In:     Session Check In - 05/19/15 1330    Check-In   Location [facility]-Cardiac & Pulmonary Rehab   Staff Present Carson Myrtle, BS, RRT, Respiratory Therapist;Susanne Bice, RN, BSN, Laveda Norman, BS, ACSM CEP, Exercise Physiologist;Rebecca Brayton El, DPT, CEEA   Supervising physician immediately available to respond to emergencies [facility] immediately available ER MD   Physician(s) Clearnce Hasten and McShane   Medication changes reported     No   Fall or balance concerns reported    No   Warm-up and Cool-down Performed on first and last piece of equipment   Resistance Training Performed Yes   VAD Patient? No   Pain Assessment   Currently in Pain? No/denies   Multiple Pain Sites No         Goals Met:  Proper associated with RPD/PD & O2 Sat Independence with exercise equipment Exercise tolerated well Strength training completed today  Goals Unmet:  Not Applicable  Comments: Patient completed exercise prescription and all exercise goals during rehab session. The exercise was tolerated well and the patient is progressing in the program.     Dr. Emily Filbert is Medical Director for [facility] and [facility].

## 2015-05-26 ENCOUNTER — Encounter: Payer: Medicare Other | Attending: Pulmonary Disease | Admitting: *Deleted

## 2015-05-26 DIAGNOSIS — I272 Other secondary pulmonary hypertension: Secondary | ICD-10-CM | POA: Diagnosis present

## 2015-05-26 DIAGNOSIS — G4730 Sleep apnea, unspecified: Secondary | ICD-10-CM | POA: Diagnosis not present

## 2015-05-26 DIAGNOSIS — I2721 Secondary pulmonary arterial hypertension: Secondary | ICD-10-CM

## 2015-05-26 NOTE — Progress Notes (Signed)
Daily Session Note  Patient Details  Name: Nicole Arnold MRN: [id number] Date of Birth: 04-21-1928 Referring Provider:    Encounter Date: 05/26/2015  Check In:     Session Check In - 05/26/15 1227    Check-In   Location [facility]-Cardiac & Pulmonary Rehab   Staff Present Carson Myrtle, BS, RRT, Respiratory Therapist;Carroll Enterkin, RN, Moises Blood, BS, ACSM CEP, Exercise Physiologist   Supervising physician immediately available to respond to emergencies [facility] immediately available ER MD   Physician(s) Burlene Arnt and Joni Fears   Medication changes reported     No   Fall or balance concerns reported    No   Warm-up and Cool-down Performed on first and last piece of equipment   Resistance Training Performed Yes   VAD Patient? No   Pain Assessment   Currently in Pain? No/denies   Multiple Pain Sites No           Exercise Prescription Changes - 05/26/15 1200    Exercise Review   Progression Yes   Resistance Training   Training Prescription Yes   Weight 2   Reps 10-12   Treadmill   MPH 1.8   Grade 0   Minutes 15   Recumbant Bike   Level 2   RPM 50   Minutes 15   NuStep   Level 2   Watts 40   Minutes 15   REL-XR   Level --   Watts --   Minutes --      Goals Met:  Proper associated with RPD/PD & O2 Sat Independence with exercise equipment Exercise tolerated well Personal goals reviewed Strength training completed today  Goals Unmet:  Not Applicable  Comments: [name] did not like the XR machine because it hurt her back. She moved to the RB machine instead and tolerated it well with no back pain.    Dr. Emily Filbert is Medical Director for [place] and [facility].

## 2015-05-28 ENCOUNTER — Encounter: Payer: Medicare Other | Admitting: *Deleted

## 2015-05-28 DIAGNOSIS — G4730 Sleep apnea, unspecified: Secondary | ICD-10-CM

## 2015-05-28 DIAGNOSIS — I2721 Secondary pulmonary arterial hypertension: Secondary | ICD-10-CM

## 2015-05-28 NOTE — Progress Notes (Signed)
Daily Session Note  Patient Details  Name: Nicole Arnold MRN: [id number] Date of Birth: 01/18/1929 Referring Provider:    Encounter Date: 05/28/2015  Check In:     Session Check In - 05/28/15 1237    Check-In   Staff Present Susanne Bice, RN, BSN, CCRP;Laureen Brown, BS, RRT, Respiratory Therapist;Diane Wright, RN, BSN   Supervising physician immediately available to respond to emergencies [facility] immediately available ER MD   Physician(s) Drs: Williams and Lord   Medication changes reported     No   Fall or balance concerns reported    No   Warm-up and Cool-down Performed on first and last piece of equipment   VAD Patient? No   Pain Assessment   Currently in Pain? No/denies         Goals Met:  Exercise tolerated well Strength training completed today  Goals Unmet:  Not Applicable  Comments: Doing well with exercise prescription progression.    Dr. Mark Miller is Medical Director for [facility] and [facility]. 

## 2015-05-30 ENCOUNTER — Encounter: Payer: Medicare Other | Admitting: *Deleted

## 2015-05-30 DIAGNOSIS — I2721 Secondary pulmonary arterial hypertension: Secondary | ICD-10-CM

## 2015-05-30 DIAGNOSIS — G4730 Sleep apnea, unspecified: Secondary | ICD-10-CM

## 2015-05-30 NOTE — Progress Notes (Signed)
Daily Session Note  Patient Details  Name: Nicole Arnold MRN: [id number] Date of Birth: 04-04-1928 Referring Provider:    Encounter Date: 05/30/2015  Check In:     Session Check In - 05/30/15 1137    Check-In   Staff Present Heath Lark, RN, BSN, CCRP;Carroll Enterkin, RN, Michaela Corner, RRT, RCP, Respiratory Therapist   Supervising physician immediately available to respond to emergencies [facility] immediately available ER MD   Physician(s)  [name] stated that she was able to work in garden longer yesterday. Attributes this to her  Pul[facility] Rehab exercise routine helping with improved stamina. She enjoys working in the garden and was happy that she was able to do this yesterday   Medication changes reported     No   Fall or balance concerns reported    No   Warm-up and Cool-down Performed on first and last piece of equipment   VAD Patient? No   Pain Assessment   Currently in Pain? No/denies         Goals Met:  Independence with exercise equipment Exercise tolerated well No report of cardiac concerns or symptoms  Goals Unmet:  Not Applicable  Comments: Doing well with exercise prescription progression.    Dr. Emily Filbert is Medical Director for [place] and [facility].

## 2015-06-02 ENCOUNTER — Encounter: Payer: Medicare Other | Admitting: *Deleted

## 2015-06-02 DIAGNOSIS — G4730 Sleep apnea, unspecified: Secondary | ICD-10-CM | POA: Diagnosis not present

## 2015-06-02 NOTE — Progress Notes (Signed)
Daily Session Note  Patient Details  Name: Nicole Arnold MRN: [id number] Date of Birth: 1928-05-23 Referring Provider:    Encounter Date: 06/02/2015  Check In:     Session Check In - 06/02/15 1159    Check-In   Location [facility]   Staff Present Carson Myrtle, BS, RRT, Respiratory Therapist;Aldrich Lloyd, RN, Alex Gardener, DPT, CEEA   Supervising physician immediately available to respond to emergencies [facility] immediately available ER MD   Physician(s) Dr. Reita Cliche and Dr. Claudette Head   Medication changes reported     No   Fall or balance concerns reported    No   Warm-up and Cool-down Performed on first and last piece of equipment   Resistance Training Performed No   VAD Patient? No   Pain Assessment   Currently in Pain? No/denies         Goals Met:  Proper associated with RPD/PD & O2 Sat Exercise tolerated well  Goals Unmet:  Not Applicable  Comments:     Dr. Emily Filbert is Medical Director for [place] and [facility].

## 2015-06-04 ENCOUNTER — Encounter: Payer: Medicare Other | Admitting: *Deleted

## 2015-06-04 DIAGNOSIS — I2721 Secondary pulmonary arterial hypertension: Secondary | ICD-10-CM

## 2015-06-04 DIAGNOSIS — G4730 Sleep apnea, unspecified: Secondary | ICD-10-CM | POA: Diagnosis not present

## 2015-06-04 NOTE — Progress Notes (Signed)
Daily Session Note  Patient Details  Name: Nicole Arnold MRN: [id number] Date of Birth: [birth year] Referring Provider:    Encounter Date: 06/04/2015  Check In:     Session Check In - 06/04/15 1132    Check-In   Staff Present Heath Lark, RN, BSN, CCRP;Carroll Enterkin, RN, BSN;Laureen Owens Shark, BS, RRT, Respiratory Therapist   Supervising physician immediately available to respond to emergencies [facility] immediately available ER MD   Physician(s) Drs: Lovena Le and Jimmye Norman   Medication changes reported     No   Fall or balance concerns reported    No   Warm-up and Cool-down Performed on first and last piece of equipment   VAD Patient? No   Pain Assessment   Currently in Pain? No/denies         Goals Met:  Proper associated with RPD/PD & O2 Sat Exercise tolerated well Strength training completed today  Goals Unmet:  Not Applicable  Comments: Doing well with exercise prescription progression.    Dr. Emily Filbert is Medical Director for [place] and [facility].

## 2015-06-06 ENCOUNTER — Encounter: Payer: Medicare Other | Admitting: *Deleted

## 2015-06-06 DIAGNOSIS — G4730 Sleep apnea, unspecified: Secondary | ICD-10-CM

## 2015-06-06 DIAGNOSIS — I2721 Secondary pulmonary arterial hypertension: Secondary | ICD-10-CM

## 2015-06-06 NOTE — Progress Notes (Signed)
Daily Session Note  Patient Details  Name: AMAND LEMOINE MRN: [id number] Date of Birth: 01-11-1929 Referring Provider:    Encounter Date: 06/06/2015  Check In:     Session Check In - 06/06/15 1101    Check-In   Staff Present Heath Lark, RN, BSN, CCRP;Carroll Enterkin, RN, Moises Blood, BS, ACSM CEP, Exercise Physiologist   Supervising physician immediately available to respond to emergencies [facility] immediately available ER MD   Physician(s) Drs; Marcelene Butte and [facility]   Medication changes reported     No   Fall or balance concerns reported    No   Warm-up and Cool-down Performed on first and last piece of equipment   VAD Patient? No   Pain Assessment   Currently in Pain? No/denies         Goals Met:  Proper associated with RPD/PD & O2 Sat Independence with exercise equipment Exercise tolerated well Strength training completed today  Goals Unmet:  Not Applicable  Comments: Doing well with exercise prescription progression.    Dr. Emily Filbert is Medical Director for [place] and [facility].

## 2015-06-10 ENCOUNTER — Encounter: Payer: Self-pay | Admitting: Respiratory Therapy

## 2015-06-10 DIAGNOSIS — G4730 Sleep apnea, unspecified: Secondary | ICD-10-CM

## 2015-06-10 DIAGNOSIS — I2721 Secondary pulmonary arterial hypertension: Secondary | ICD-10-CM

## 2015-06-10 NOTE — Progress Notes (Signed)
Pulmonary Individual Treatment Plan  Patient Details  Name: Nicole Arnold MRN: [id number] Date of Birth: 1928/08/23 Referring Provider: Dr Simonne Maffucci   Initial Encounter Date:       Pulmonary Rehab from 05/13/2015 in [facility] Cardiac and Pulmonary Rehab   Date  05/13/15      Visit Diagnosis: Sleep apnea  Pulmonary arterial hypertension ([place])  Patient's Home Medications on Admission:  Current outpatient prescriptions:    ALPRAZolam (XANAX) 0.5 MG tablet, Take 0.5 mg by mouth 3 (three) times daily as needed for anxiety., Disp: , Rfl:    amLODipine (NORVASC) 10 MG tablet, Take 10 mg by mouth daily., Disp: , Rfl:    aspirin 81 MG tablet, Take 81 mg by mouth daily., Disp: , Rfl:    Calcium-Vitamin D (CALTRATE 600 PLUS-VIT D PO), Take 1 tablet by mouth 2 (two) times daily., Disp: , Rfl:    Ipratropium-Albuterol (COMBIVENT RESPIMAT) 20-100 MCG/ACT AERS respimat, Inhale 1 puff into the lungs every 6 (six) hours as needed for wheezing or shortness of breath., Disp: 3 Inhaler, Rfl: 3   lansoprazole (PREVACID) 30 MG capsule, Take 30 mg by mouth daily at 12 noon., Disp: , Rfl:    Multiple Vitamins-Minerals (CENTRUM SILVER ADULT 50+ PO), Take 1 tablet by mouth daily., Disp: , Rfl:    PARoxetine (PAXIL) 20 MG tablet, Take 20 mg by mouth daily., Disp: , Rfl:    raloxifene (EVISTA) 60 MG tablet, Take 60 mg by mouth daily., Disp: , Rfl:    vitamin E 400 UNIT capsule, Take 400 Units by mouth daily., Disp: , Rfl:   Past Medical History: Past Medical History  Diagnosis Date   Pancreatitis    Cataract    Chronic constipation    GERD (gastroesophageal reflux disease)    Osteopenia     Tobacco Use: History  Smoking status   Former Smoker -- 0.50 packs/day for 30 years   Types: Cigarettes   Quit date: 01/25/1993  Smokeless tobacco   Never Used    Labs: Recent Review Flowsheet Data    There is no flowsheet data to display.       ADL UCSD:     Pulmonary  Assessment Scores      05/13/15 0905       ADL UCSD   ADL Phase Entry     SOB Score total 48     Rest 0     Walk 2     Stairs 5     Bath 4     Dress 3     Shop 3        Pulmonary Function Assessment:     Pulmonary Function Assessment - 05/13/15 0905    Pulmonary Function Tests   RV% 49 %   DLCO% 50 %   Initial Spirometry Results   FVC% 119 %   FEV1% 88 %   FEV1/FVC Ratio 50   Comments Test date 07/31/2013   Post Bronchodilator Spirometry Results   FVC% 115 %   FEV1% 96 %   FEV1/FVC Ratio 56   Breath   Bilateral Breath Sounds Clear   Shortness of Breath Yes;Fear of Shortness of Breath;Limiting activity      Exercise Target Goals:    Exercise Program Goal: Individual exercise prescription set with THRR, safety & activity barriers. Participant demonstrates ability to understand and report RPE using BORG scale, to self-measure pulse accurately, and to acknowledge the importance of the exercise prescription.  Exercise Prescription Goal: Starting with aerobic  activity 30 plus minutes a day, 3 days per week for initial exercise prescription. Provide home exercise prescription and guidelines that participant acknowledges understanding prior to discharge.  Activity Barriers & Risk Stratification:     Activity Barriers & Cardiac Risk Stratification - 05/13/15 0905    Activity Barriers & Cardiac Risk Stratification   Activity Barriers Shortness of Breath;Deconditioning;Muscular Weakness   Cardiac Risk Stratification Moderate      6 Minute Walk:     6 Minute Walk      05/13/15 1024       6 Minute Walk   Phase Initial     Distance 1175 feet     Walk Time 6 minutes     RPE 17     Perceived Dyspnea  4     Resting HR 64 bpm     Resting BP 134/70 mmHg     Max Ex. HR 115 bpm     Max Ex. BP 178/70 mmHg        Initial Exercise Prescription:     Initial Exercise Prescription - 05/13/15 1000    Date of Initial Exercise RX and Referring Provider   Date  05/13/15   Treadmill   MPH 2   Grade 0   Minutes 15   NuStep   Level 2   Watts 40   Minutes 15   Recumbant Elliptical   Level 2   RPM 40   Watts 20   Minutes 15   REL-XR   Level 2   Watts 50   Minutes 15   T5 Nustep   Level 1   Watts 50   Minutes 15   Biostep-RELP   Level 1   Watts 15   Minutes 15   Prescription Details   Frequency (times per week) 3   Duration Progress to 50 minutes of aerobic without signs/symptoms of physical distress   Intensity   THRR REST +  30   Ratings of Perceived Exertion 11-15   Perceived Dyspnea 0-4   Progression   Progression Continue to progress workloads to maintain intensity without signs/symptoms of physical distress.   Resistance Training   Training Prescription Yes   Weight 2   Reps 10-15      Perform Capillary Blood Glucose checks as needed.  Exercise Prescription Changes:     Exercise Prescription Changes      05/19/15 1500 05/26/15 1200 05/27/15 0800 05/28/15 1500     Exercise Review   Progression  Yes Yes     Response to Exercise   Blood Pressure (Admit) 122/62 mmHg  120/68 mmHg  Data from 05/26/2015     Blood Pressure (Exercise)   174/82 mmHg     Blood Pressure (Exit) 120/66 mmHg  116/64 mmHg     Heart Rate (Admit) 65 bpm  64 bpm     Heart Rate (Exercise) 91 bpm  103 bpm     Heart Rate (Exit) 74 bpm  73 bpm     Oxygen Saturation (Admit) 98 %  99 %     Oxygen Saturation (Exercise) 91 %  93 %     Oxygen Saturation (Exit) 98 %  95 %     Rating of Perceived Exertion (Exercise) 11  13     Perceived Dyspnea (Exercise) 3  4     Symptoms   none     Comments   [name] did not like the XR -too much stress on her back, so we changed to the  BX.     Progression   Progression   Continue to progress workloads to maintain intensity without signs/symptoms of physical distress.     Resistance Training   Training Prescription Yes Yes Yes     Weight _0 Reps 10-12 10-12 10-12     Treadmill   MPH 1.8 1.8 2      Grade 0 0 0     Minutes _1 Recumbant Bike   Level  2 2     RPM  50 50     Minutes  15 15     NuStep   Level _2 Watts 40 40 40     Minutes _3 REL-XR   Level 2 --      Watts 50 --      Minutes 15 --      Biostep-RELP   Level    2    Watts    50    Minutes    15       Exercise Comments:     Exercise Comments      05/26/15 1231           Exercise Comments Assunta did not like the XR machine because it hurt her back. She moved to the RB machine instead and tolerated it well with no back pain.           Discharge Exercise Prescription (Final Exercise Prescription Changes):     Exercise Prescription Changes - 05/28/15 1500    Biostep-RELP   Level 2   Watts 50   Minutes 15       Nutrition:  Target Goals: Understanding of nutrition guidelines, daily intake of sodium <153m, cholesterol <2069m calories 30% from fat and 7% or less from saturated fats, daily to have 5 or more servings of fruits and vegetables.  Biometrics:     Pre Biometrics - 05/13/15 1031    Pre Biometrics   Height _4  (1.6 m)   Weight 188 lb 12.8 oz (85.639 kg)   Waist Circumference 38 inches   Hip Circumference 46 inches   Waist to Hip Ratio 0.83 %   BMI (Calculated) 33.5       Nutrition Therapy Plan and Nutrition Goals:     Nutrition Therapy & Goals - 05/13/15 0905    Nutrition Therapy   Diet Prefers not to meet with the dietitian; Ms PhDetterooks healthy, she does like sweets; low salt and less meats.      Nutrition Discharge: Rate Your Plate Scores:   Psychosocial: Target Goals: Acknowledge presence or absence of depression, maximize coping skills, provide positive support system. Participant is able to verbalize types and ability to use techniques and skills needed for reducing stress and depression.  Initial Review & Psychosocial Screening:     Initial Psych Review & Screening - 05/13/15 09FeltonYes    Comments Ms PhNavejasas good family support from her husband and daughter who is a nuMarine scientistHer main concern is her husband and his lack of activity and socialization. Ms PhKiefoves people  and does have an active social life.   Barriers   Psychosocial barriers to participate in program The patient should benefit from training in stress management and relaxation.   Screening Interventions   Interventions Encouraged to exercise;Program counselor consult  Quality of Life Scores:     Quality of Life - 05/13/15 0905    Quality of Life Scores   Health/Function Pre 25.2 %   Socioeconomic Pre 30 %   Psych/Spiritual Pre 30 %   Family Pre 28.8 %   GLOBAL Pre 27.71 %      PHQ-9:     Recent Review Flowsheet Data    Depression screen [facility] 2/9 05/13/2015   Decreased Interest 0   Down, Depressed, Hopeless 0   PHQ - 2 Score 0   Altered sleeping 0   Tired, decreased energy 2   Change in appetite 0   Feeling bad or failure about yourself  0   Trouble concentrating 0   Moving slowly or fidgety/restless 0   Suicidal thoughts 0   PHQ-9 Score 2   Difficult doing work/chores Somewhat difficult      Psychosocial Evaluation and Intervention:     Psychosocial Evaluation - 05/19/15 1118    Psychosocial Evaluation & Interventions   Interventions Stress management education;Relaxation education;Encouraged to exercise with the program and follow exercise prescription   Comments Counselor met with Ms. Henkels today for initial psychosocial evaluation.  She was in this program last year and has had a lot of health issues occur since that time.  Ms. Hegwood hurt her foot in November, had double pneumonia in December, and fell and broke 6 ribs in January of this year.  She is also having problems with an ovary currently and will be seeing a doctor about that soon.  Ms. Yambao has a strong support system with a spouse and living in a retirement community.  She reports sleeping well (maybe  "too well") now with a CPAP.  She has a history of anxiety but is on medications that help her with this and no current symptoms.  She reports her mood is typically positive and her major stress are her health issues.  Ms. Trueba has goals to increase her stamina and strength including breathing better.  She has been and will continue to work out at the gym at the retirement community where she resides.     Continued Psychosocial Services Needed Yes  Ms. Hannig will benefit from the psychoeducational components of this program.        Psychosocial Re-Evaluation:  Education: Education Goals: Education classes will be provided on a weekly basis, covering required topics. Participant will state understanding/return demonstration of topics presented.  Learning Barriers/Preferences:     Learning Barriers/Preferences - 05/13/15 8299    Learning Barriers/Preferences   Learning Barriers None   Learning Preferences Group Instruction;Individual Instruction;Pictoral;Skilled Demonstration;Verbal Instruction;Video;Written Material      Education Topics: Initial Evaluation Education: - Verbal, written and demonstration of respiratory meds, RPE/PD scales, oximetry and breathing techniques. Instruction on use of nebulizers and MDIs: cleaning and proper use, rinsing mouth with steroid doses and importance of monitoring MDI activations.          Pulmonary Rehab from 06/04/2015 in [facility] Cardiac and Pulmonary Rehab   Date  05/13/15   Educator  LB   Instruction Review Code  2- meets goals/outcomes      General Nutrition Guidelines/Fats and Fiber: -Group instruction provided by verbal, written material, models and posters to present the general guidelines for heart healthy nutrition. Gives an explanation and review of dietary fats and fiber.   Controlling Sodium/Reading Food Labels: -Group verbal and written material supporting the discussion of sodium use in heart healthy nutrition. Review and  explanation with models,  verbal and written materials for utilization of the food label.   Exercise Physiology & Risk Factors: - Group verbal and written instruction with models to review the exercise physiology of the cardiovascular system and associated critical values. Details cardiovascular disease risk factors and the goals associated with each risk factor.      Pulmonary Rehab from 06/04/2015 in [facility] Cardiac and Pulmonary Rehab   Date  05/13/15   Educator  LB2      Aerobic Exercise & Resistance Training: - Gives group verbal and written discussion on the health impact of inactivity. On the components of aerobic and resistive training programs and the benefits of this training and how to safely progress through these programs.   Flexibility, Balance, General Exercise Guidelines: - Provides group verbal and written instruction on the benefits of flexibility and balance training programs. Provides general exercise guidelines with specific guidelines to those with heart or lung disease. Demonstration and skill practice provided.      Pulmonary Rehab from 06/04/2015 in [facility] Cardiac and Pulmonary Rehab   Date  06/04/15   Educator  BS   Instruction Review Code  2- meets goals/outcomes      Stress Management: - Provides group verbal and written instruction about the health risks of elevated stress, cause of high stress, and healthy ways to reduce stress.   Depression: - Provides group verbal and written instruction on the correlation between heart/lung disease and depressed mood, treatment options, and the stigmas associated with seeking treatment.   Exercise & Equipment Safety: - Individual verbal instruction and demonstration of equipment use and safety with use of the equipment.   Infection Prevention: - Provides verbal and written material to individual with discussion of infection control including proper hand washing and proper equipment cleaning during exercise  session.   Falls Prevention: - Provides verbal and written material to individual with discussion of falls prevention and safety.      Pulmonary Rehab from 06/04/2015 in [facility] Cardiac and Pulmonary Rehab   Date  05/13/15   Educator  LB   Instruction Review Code  2- meets goals/outcomes      Diabetes: - Individual verbal and written instruction to review signs/symptoms of diabetes, desired ranges of glucose level fasting, after meals and with exercise. Advice that pre and post exercise glucose checks will be done for 3 sessions at entry of program.   Chronic Lung Diseases: - Group verbal and written instruction to review new updates, new respiratory medications, new advancements in procedures and treatments. Provide informative websites and "800" numbers of self-education.      Pulmonary Rehab from 06/04/2015 in [facility] Cardiac and Pulmonary Rehab   Date  06/02/15   Educator  L. Owens Shark, RT   Instruction Review Code  2- meets goals/outcomes      Lung Procedures: - Group verbal and written instruction to describe testing methods done to diagnose lung disease. Review the outcome of test results. Describe the treatment choices: Pulmonary Function Tests, ABGs and oximetry.   Energy Conservation: - Provide group verbal and written instruction for methods to conserve energy, plan and organize activities. Instruct on pacing techniques, use of adaptive equipment and posture/positioning to relieve shortness of breath.   Triggers: - Group verbal and written instruction to review types of environmental controls: home humidity, furnaces, filters, dust mite/pet prevention, HEPA vacuums. To discuss weather changes, air quality and the benefits of nasal washing.      Pulmonary Rehab from 06/04/2015 in [facility] Cardiac and Pulmonary Rehab  Date  05/19/15   Educator  LB   Instruction Review Code  2- meets goals/outcomes      Exacerbations: - Group verbal and written instruction to provide: warning  signs, infection symptoms, calling MD promptly, preventive modes, and value of vaccinations. Review: effective airway clearance, coughing and/or vibration techniques. Create an Sports administrator.   Oxygen: - Individual and group verbal and written instruction on oxygen therapy. Includes supplement oxygen, available portable oxygen systems, continuous and intermittent flow rates, oxygen safety, concentrators, and Medicare reimbursement for oxygen.   Respiratory Medications: - Group verbal and written instruction to review medications for lung disease. Drug class, frequency, complications, importance of spacers, rinsing mouth after steroid MDI's, and proper cleaning methods for nebulizers.   AED/CPR: - Group verbal and written instruction with the use of models to demonstrate the basic use of the AED with the basic ABC's of resuscitation.   Breathing Retraining: - Provides individuals verbal and written instruction on purpose, frequency, and proper technique of diaphragmatic breathing and pursed-lipped breathing. Applies individual practice skills.      Pulmonary Rehab from 06/04/2015 in [facility] Cardiac and Pulmonary Rehab   Date  05/13/15   Educator  LB   Instruction Review Code  2- meets goals/outcomes      Anatomy and Physiology of the Lungs: - Group verbal and written instruction with the use of models to provide basic lung anatomy and physiology related to function, structure and complications of lung disease.   Heart Failure: - Group verbal and written instruction on the basics of heart failure: signs/symptoms, treatments, explanation of ejection fraction, enlarged heart and cardiomyopathy.   Sleep Apnea: - Individual verbal and written instruction to review Obstructive Sleep Apnea. Review of risk factors, methods for diagnosing and types of masks and machines for OSA.   Anxiety: - Provides group, verbal and written instruction on the correlation between heart/lung disease and anxiety,  treatment options, and management of anxiety.   Relaxation: - Provides group, verbal and written instruction about the benefits of relaxation for patients with heart/lung disease. Also provides patients with examples of relaxation techniques.   Knowledge Questionnaire Score:     Knowledge Questionnaire Score - 05/13/15 0905    Knowledge Questionnaire Score   Pre Score 6/10       Core Components/Risk Factors/Patient Goals at Admission:     Personal Goals and Risk Factors at Admission - 05/13/15 0905    Core Components/Risk Factors/Patient Goals on Admission    Weight Management Yes;Weight Loss   Intervention Weight Management: Develop a combined nutrition and exercise program designed to reach desired caloric intake, while maintaining appropriate intake of nutrient and fiber, sodium and fats, and appropriate energy expenditure required for the weight goal.;Weight Management: Provide education and appropriate resources to help participant work on and attain dietary goals.;Weight Management/Obesity: Establish reasonable short term and long term weight goals.;Obesity: Provide education and appropriate resources to help participant work on and attain dietary goals.   Admit Weight 189 lb (85.73 kg)   Goal Weight: Short Term 184 lb (83.462 kg)   Goal Weight: Long Term 159 lb (72.122 kg)   Expected Outcomes Short Term: Continue to assess and modify interventions until short term weight is achieved;Long Term: Adherence to nutrition and physical activity/exercise program aimed toward attainment of established weight goal;Weight Maintenance: Understanding of the daily nutrition guidelines, which includes 25-35% calories from fat, 7% or less cal from saturated fats, less than 290m cholesterol, less than 1.5gm of sodium, & 5 or more  servings of fruits and vegetables daily;Weight Loss: Understanding of general recommendations for a balanced deficit meal plan, which promotes 1-2 lb weight loss per week  and includes a negative energy balance of [phone] kcal/d;Understanding recommendations for meals to include 15-35% energy as protein, 25-35% energy from fat, 35-60% energy from carbohydrates, less than 244m of dietary cholesterol, 20-35 gm of total fiber daily;Understanding of distribution of calorie intake throughout the day with the consumption of 4-5 meals/snacks;Weight Gain: Understanding of general recommendations for a high calorie, high protein meal plan that promotes weight gain by distributing calorie intake throughout the day with the consumption for 4-5 meals, snacks, and/or supplements   Sedentary Yes   Intervention Provide advice, education, support and counseling about physical activity/exercise needs.;Develop an individualized exercise prescription for aerobic and resistive training based on initial evaluation findings, risk stratification, comorbidities and participant's personal goals.   Expected Outcomes Achievement of increased cardiorespiratory fitness and enhanced flexibility, muscular endurance and strength shown through measurements of functional capacity and personal statement of participant.   Increase Strength and Stamina Yes   Intervention Provide advice, education, support and counseling about physical activity/exercise needs.;Develop an individualized exercise prescription for aerobic and resistive training based on initial evaluation findings, risk stratification, comorbidities and participant's personal goals.   Expected Outcomes Achievement of increased cardiorespiratory fitness and enhanced flexibility, muscular endurance and strength shown through measurements of functional capacity and personal statement of participant.   Improve shortness of breath with ADL's Yes   Intervention Provide education, individualized exercise plan and daily activity instruction to help decrease symptoms of SOB with activities of daily living.   Expected Outcomes Short Term: Achieves a  reduction of symptoms when performing activities of daily living.   Develop more efficient breathing techniques such as purse lipped breathing and diaphragmatic breathing; and practicing self-pacing with activity Yes   Intervention Provide education, demonstration and support about specific breathing techniuqes utilized for more efficient breathing. Include techniques such as pursed lipped breathing, diaphragmatic breathing and self-pacing activity.   Expected Outcomes Short Term: Participant will be able to demonstrate and use breathing techniques as needed throughout daily activities.   Increase knowledge of respiratory medications and ability to use respiratory devices properly  Yes   Intervention Provide education and demonstration as needed of appropriate use of medications, inhalers, and oxygen therapy.  Ms PTimmonshas Symbicort Respimat as needed and rarely uses; CPAP [place]full face mask+   Expected Outcomes Short Term: Achieves understanding of medications use. Understands that oxygen is a medication prescribed by physician. Demonstrates appropriate use of inhaler and oxygen therapy.      Core Components/Risk Factors/Patient Goals Review:      Goals and Risk Factor Review      05/19/15 1544 05/28/15 1000 06/02/15 1000       Core Components/Risk Factors/Patient Goals Review   Personal Goals Review Develop more efficient breathing techniques such as purse lipped breathing and diaphragmatic breathing and practicing self-pacing with activity. Sedentary Increase knowledge of respiratory medications and ability to use respiratory devices properly.;Improve shortness of breath with ADL's;Develop more efficient breathing techniques such as purse lipped breathing and diaphragmatic breathing and practicing self-pacing with activity.;Increase Strength and Stamina;Sedentary     Review Ms PManahanused PLB breathing with her exercise goals today - good technique. Ms PSchlosserplans  [facility]. She plans to walk on Tuesdays and Thursdays one mile. Ms PHauthexercised at the [employer]  on her off days from [place]. She is determined to work on weight loss and increase her stamina and be less sedentary. Walking the track at [place] hurt her back, so she did use the treadmill and NuStep. we discussed her MDI's.. Ms Kotlarz rarely uses her Combivent. She deals with her shortness of breath by pacing and PLB. She states that she has improved her breath and notices this mainly with her housework.     Expected Outcomes Continue using PLB breathing without cueing with her exercise and home activites. Increase her exercise to 5 days/week.         Core Components/Risk Factors/Patient Goals at Discharge (Final Review):      Goals and Risk Factor Review - 06/02/15 1000    Core Components/Risk Factors/Patient Goals Review   Personal Goals Review Increase knowledge of respiratory medications and ability to use respiratory devices properly.;Improve shortness of breath with ADL's;Develop more efficient breathing techniques such as purse lipped breathing and diaphragmatic breathing and practicing self-pacing with activity.;Increase Strength and Stamina;Sedentary   Review Ms Coutts exercised at the [employer] on her off days from [employer]. She is determined to work on weight loss and increase her stamina and be less sedentary. Walking the track at [place] hurt her back, so she did use the treadmill and NuStep. we discussed her MDI's.. Ms Kimber rarely uses her Combivent. She deals with her shortness of breath by pacing and PLB. She states that she has improved her breath and notices this mainly with her housework.      ITP Comments:     ITP Comments      05/19/15 1000 05/26/15 1000         ITP Comments Ms Hendricksen completed her first day of exercise in [facility] and accomplished her exercise goals with  acceptable vital signs and RPE/PD scales. She completed her weight goals and stayed for education class today. Ms Stuber is concerned about her results from a P125 lab test. An ultrasound was performed, and she is waiting for results.         Comments:  30 Day Progress Note

## 2015-06-11 DIAGNOSIS — G4730 Sleep apnea, unspecified: Secondary | ICD-10-CM | POA: Diagnosis not present

## 2015-06-11 DIAGNOSIS — I2721 Secondary pulmonary arterial hypertension: Secondary | ICD-10-CM

## 2015-06-11 NOTE — Progress Notes (Signed)
Daily Session Note  Patient Details  Name: Nicole Arnold MRN: [id number] Date of Birth: Mar 30, 1928 Referring Provider:    Encounter Date: 06/11/2015  Check In:     Session Check In - 06/11/15 1148    Check-In   Location [facility]   Staff Present Heath Lark, RN, BSN, CCRP;Laureen Owens Shark, BS, RRT, Respiratory Lennie Hummer, MA, ACSM RCEP, Exercise Physiologist;Amanda Oletta Darter, BA, ACSM CEP, Exercise Physiologist;Diane Joya Gaskins, RN, BSN   Supervising physician immediately available to respond to emergencies [facility] immediately available ER MD   Physician(s) Dr. Joni Fears and Clearnce Hasten   Medication changes reported     No   Fall or balance concerns reported    No   Warm-up and Cool-down Performed on first and last piece of equipment   Resistance Training Performed Yes   VAD Patient? No   Pain Assessment   Currently in Pain? No/denies         Goals Met:  Proper associated with RPD/PD & O2 Sat Independence with exercise equipment Exercise tolerated well Strength training completed today  Goals Unmet:  Not Applicable  Comments: Daiana is progressing well with exercise   Dr. Emily Filbert is Medical Director for [place] and [facility].

## 2015-06-13 ENCOUNTER — Encounter: Payer: Medicare Other | Admitting: Respiratory Therapy

## 2015-06-13 DIAGNOSIS — G4730 Sleep apnea, unspecified: Secondary | ICD-10-CM | POA: Diagnosis not present

## 2015-06-13 DIAGNOSIS — I2721 Secondary pulmonary arterial hypertension: Secondary | ICD-10-CM

## 2015-06-13 NOTE — Progress Notes (Signed)
Daily Session Note  Patient Details  Name: Nicole Arnold MRN: [id number] Date of Birth: 1928/05/05 Referring Provider:    Encounter Date: 06/13/2015  Check In:     Session Check In - 06/13/15 1123    Check-In   Location [facility]   Staff Present Alberteen Sam, MA, ACSM RCEP, Exercise Physiologist;Amanda Oletta Darter, BA, ACSM CEP, Exercise Physiologist;Stacey Blanch Media, RRT, RCP, Respiratory Therapist;Carroll Enterkin, RN, BSN   Supervising physician immediately available to respond to emergencies [facility] immediately available ER MD   Physician(s) Cinda Quest and Jimmye Norman   Medication changes reported     No   Fall or balance concerns reported    No   Warm-up and Cool-down Performed on first and last piece of equipment   Resistance Training Performed Yes   VAD Patient? No   Pain Assessment   Currently in Pain? No/denies   Multiple Pain Sites No         Goals Met:  Proper associated with RPD/PD & O2 Sat Independence with exercise equipment Exercise tolerated well Strength training completed today  Goals Unmet:  Not Applicable  Comments: Pt able to follow exercise prescription without complaint.   Dr. Emily Filbert is Medical Director for [place] and [facility].

## 2015-06-16 ENCOUNTER — Encounter: Payer: Medicare Other | Admitting: *Deleted

## 2015-06-16 DIAGNOSIS — G4730 Sleep apnea, unspecified: Secondary | ICD-10-CM | POA: Diagnosis not present

## 2015-06-16 DIAGNOSIS — I2721 Secondary pulmonary arterial hypertension: Secondary | ICD-10-CM

## 2015-06-16 NOTE — Progress Notes (Signed)
Daily Session Note  Patient Details  Name: Nicole Arnold MRN: [id number] Date of Birth: 06/01/1928 Referring Provider:    Encounter Date: 06/16/2015  Check In:     Session Check In - 06/16/15 1136    Check-In   Location [facility]   Staff Present Carson Myrtle, BS, RRT, Respiratory Therapist;Jonel Weldon, RN, Moises Blood, BS, ACSM CEP, Exercise Physiologist;Mary Kellie Shropshire, RN   Supervising physician immediately available to respond to emergencies [facility] immediately available ER MD   Physician(s) Dr. Mariea Clonts and Dr. Reita Cliche   Medication changes reported     No   Fall or balance concerns reported    No   Warm-up and Cool-down Performed on first and last piece of equipment   Resistance Training Performed Yes   VAD Patient? No   Pain Assessment   Currently in Pain? No/denies         Goals Met:  Proper associated with RPD/PD & O2 Sat Exercise tolerated well  Goals Unmet:  Not Applicable  Comments:     Dr. Emily Filbert is Medical Director for [place] and [facility].

## 2015-06-18 ENCOUNTER — Encounter: Payer: Medicare Other | Admitting: *Deleted

## 2015-06-18 DIAGNOSIS — G4730 Sleep apnea, unspecified: Secondary | ICD-10-CM

## 2015-06-18 DIAGNOSIS — I2721 Secondary pulmonary arterial hypertension: Secondary | ICD-10-CM

## 2015-06-18 NOTE — Progress Notes (Signed)
Daily Session Note  Patient Details  Name: Nicole Arnold MRN: [id number] Date of Birth: [birth year] Referring Provider:    Encounter Date: 06/18/2015  Check In:     Session Check In - 06/18/15 1133    Check-In   Location [facility]   Staff Present Alberteen Sam, MA, ACSM RCEP, Exercise Physiologist;Mary Kellie Shropshire, Elveria Rising, BA, ACSM CEP, Exercise Physiologist;Laureen Owens Shark, BS, RRT, Respiratory Orlie Dakin, RN, BSN   Supervising physician immediately available to respond to emergencies [facility] immediately available ER MD   Physician(s) Cinda Quest and [place]   Medication changes reported     No   Fall or balance concerns reported    No   Warm-up and Cool-down Performed on first and last piece of equipment   Resistance Training Performed Yes   VAD Patient? No   Pain Assessment   Currently in Pain? No/denies   Multiple Pain Sites No         Goals Met:  Proper associated with RPD/PD & O2 Sat Independence with exercise equipment Exercise tolerated well Strength training completed today  Goals Unmet:  Not Applicable  Comments: Pt able to follow exercise prescription today without complaint.  Will continue to monitor for progression.    Dr. Emily Filbert is Medical Director for [place] and [facility].

## 2015-06-25 ENCOUNTER — Encounter: Payer: Medicare Other | Admitting: *Deleted

## 2015-06-25 DIAGNOSIS — G4730 Sleep apnea, unspecified: Secondary | ICD-10-CM

## 2015-06-25 DIAGNOSIS — I2721 Secondary pulmonary arterial hypertension: Secondary | ICD-10-CM

## 2015-06-25 NOTE — Progress Notes (Signed)
Daily Session Note  Patient Details  Name: DAYSIA VANDENBOOM MRN: [id number] Date of Birth: Jul 31, 1928 Referring Provider:    Encounter Date: 06/25/2015  Check In:     Session Check In - 06/25/15 1151    Check-In   Location [facility]   Staff Present Nyoka Cowden, RN;Laureen Owens Shark, BS, RRT, Respiratory Therapist;Zayvion Stailey Joya Gaskins, RN, BSN   Supervising physician immediately available to respond to emergencies [facility] immediately available ER MD   Physician(s) Jacqualine Code and Jimmye Norman   Medication changes reported     No   Fall or balance concerns reported    No   Warm-up and Cool-down Performed on first and last piece of equipment   Resistance Training Performed Yes   VAD Patient? No   Pain Assessment   Currently in Pain? No/denies         Goals Met:  Proper associated with RPD/PD & O2 Sat Independence with exercise equipment Exercise tolerated well Strength training completed today  Goals Unmet:  Not Applicable  Comments:  Pt able to follow exercise prescription today without complaint.  Will continue to monitor for progression.    Dr. Emily Filbert is Medical Director for [place] and [facility].

## 2015-06-27 ENCOUNTER — Encounter: Payer: Medicare Other | Attending: Pulmonary Disease | Admitting: Respiratory Therapy

## 2015-06-27 DIAGNOSIS — G4730 Sleep apnea, unspecified: Secondary | ICD-10-CM | POA: Insufficient documentation

## 2015-06-27 DIAGNOSIS — I272 Other secondary pulmonary hypertension: Secondary | ICD-10-CM | POA: Diagnosis present

## 2015-06-27 DIAGNOSIS — I2721 Secondary pulmonary arterial hypertension: Secondary | ICD-10-CM

## 2015-06-27 NOTE — Progress Notes (Signed)
Daily Session Note  Patient Details  Name: Nicole Arnold MRN: [id number] Date of Birth: July 18, 1928 Referring Provider:    Encounter Date: 06/27/2015  Check In:     Session Check In - 06/27/15 1050    Check-In   Location [facility]   Staff Present Heath Lark, RN, BSN, CCRP;Carroll Enterkin, RN, Building control surveyor, RRT, RCP, Respiratory Lennie Hummer, MA, ACSM RCEP, Exercise Physiologist   Supervising physician immediately available to respond to emergencies [facility] immediately available ER MD   Physician(s) Dr. Edd Fabian and Darlys Gales   Medication changes reported     No   Fall or balance concerns reported    No   Warm-up and Cool-down Performed on first and last piece of equipment   Resistance Training Performed Yes   VAD Patient? No   Pain Assessment   Currently in Pain? No/denies         Goals Met:  Proper associated with RPD/PD & O2 Sat Independence with exercise equipment Exercise tolerated well Strength training completed today  Goals Unmet:  Not Applicable  Comments: Pt able to follow exercise prescription today without complaint.  Will continue to monitor for progression.    Dr. Emily Filbert is Medical Director for [place] and [facility].

## 2015-06-30 ENCOUNTER — Encounter: Payer: Medicare Other | Admitting: *Deleted

## 2015-06-30 DIAGNOSIS — G4730 Sleep apnea, unspecified: Secondary | ICD-10-CM

## 2015-06-30 DIAGNOSIS — I2721 Secondary pulmonary arterial hypertension: Secondary | ICD-10-CM

## 2015-06-30 NOTE — Progress Notes (Signed)
Daily Session Note  Patient Details  Name: Nicole Arnold MRN: [id number] Date of Birth: [birth year] Referring Provider:    Encounter Date: 06/30/2015  Check In:     Session Check In - 06/30/15 1203    Check-In   Location [facility]   Staff Present Carson Myrtle, BS, RRT, Respiratory Therapist;Javani Spratt Amedeo Plenty, BS, ACSM CEP, Exercise Physiologist;Jessica Luan Pulling, MA, ACSM RCEP, Exercise Physiologist   Supervising physician immediately available to respond to emergencies [facility] immediately available ER MD   Physician(s) Cinda Quest and [place]   Medication changes reported     No   Fall or balance concerns reported    No   Warm-up and Cool-down Performed on first and last piece of equipment   Resistance Training Performed Yes   VAD Patient? No   Pain Assessment   Currently in Pain? No/denies   Multiple Pain Sites No         Goals Met:  Proper associated with RPD/PD & O2 Sat Independence with exercise equipment Exercise tolerated well Personal goals reviewed Strength training completed today  Goals Unmet:  Not Applicable  Comments: Patient completed exercise prescription and all exercise goals during rehab session. The exercise was tolerated well and the patient is progressing in the program. Home exercise guide reviewed with patient.     Dr. Emily Filbert is Medical Director for [place] and [facility].

## 2015-07-02 ENCOUNTER — Encounter: Payer: Medicare Other | Admitting: *Deleted

## 2015-07-02 DIAGNOSIS — G4730 Sleep apnea, unspecified: Secondary | ICD-10-CM

## 2015-07-02 DIAGNOSIS — I2721 Secondary pulmonary arterial hypertension: Secondary | ICD-10-CM

## 2015-07-02 NOTE — Progress Notes (Signed)
Daily Session Note  Patient Details  Name: Nicole Arnold MRN: [id number] Date of Birth: December 08, 1928 Referring Provider:    Encounter Date: 07/02/2015  Check In:     Session Check In - 07/02/15 1111    Check-In   Location [facility]   Staff Present Carson Myrtle, BS, RRT, Respiratory Therapist;Carroll Enterkin, RN, Vickki Hearing, BA, ACSM CEP, Exercise Physiologist   Supervising physician immediately available to respond to emergencies [facility] immediately available ER MD   Physician(s) Darl Householder and [place]   Medication changes reported     No   Fall or balance concerns reported    No   Warm-up and Cool-down Performed on first and last piece of equipment   Resistance Training Performed Yes   VAD Patient? No   Pain Assessment   Currently in Pain? No/denies   Multiple Pain Sites No           Exercise Prescription Changes - 07/01/15 1400    Exercise Review   Progression Yes   Response to Exercise   Blood Pressure (Admit) 148/82 mmHg   Blood Pressure (Exercise) 142/80 mmHg   Blood Pressure (Exit) 132/74 mmHg   Heart Rate (Admit) 71 bpm   Heart Rate (Exercise) 104 bpm   Heart Rate (Exit) 74 bpm   Oxygen Saturation (Admit) 99 %   Oxygen Saturation (Exercise) 93 %   Oxygen Saturation (Exit) 97 %   Rating of Perceived Exertion (Exercise) 14   Perceived Dyspnea (Exercise) 4   Symptoms None   Comments Home Exercise Given 06/30/15   Duration Progress to 45 minutes of aerobic exercise without signs/symptoms of physical distress   Intensity THRR New  93-122   Progression   Progression Continue to progress workloads to maintain intensity without signs/symptoms of physical distress.   Average METs 2.4   Resistance Training   Training Prescription Yes   Weight 2   Reps 10-12   Interval Training   Interval Training No   Treadmill   MPH 2   Grade 0   Minutes 15   METs 2.53   Recumbant Bike   Level 3   RPM 56   Watts 20   Minutes 15   NuStep   Level 3   Watts 39   Minutes 15   METs 2.2   Home Exercise Plan   Plans to continue exercise at [employer] (comment)  [employer]   Frequency Add 3 additional days to program exercise sessions.      Goals Met:  Proper associated with RPD/PD & O2 Sat Independence with exercise equipment Exercise tolerated well Strength training completed today  Goals Unmet:  Not Applicable  Comments: Pt able to follow exercise prescription today without complaint.  Will continue to monitor for progression.    Dr. Emily Filbert is Medical Director for [place] and [facility].

## 2015-07-04 ENCOUNTER — Encounter: Payer: Medicare Other | Admitting: *Deleted

## 2015-07-04 DIAGNOSIS — G4730 Sleep apnea, unspecified: Secondary | ICD-10-CM

## 2015-07-04 DIAGNOSIS — I2721 Secondary pulmonary arterial hypertension: Secondary | ICD-10-CM

## 2015-07-04 NOTE — Progress Notes (Signed)
Daily Session Note  Patient Details  Name: Nicole Arnold MRN: [id number] Date of Birth: 1928/09/21 Referring Provider:        Pulmonary Rehab from 07/02/2015 in [facility]   Referring Provider  Simonne Maffucci MD      Encounter Date: 07/04/2015  Check In:     Session Check In - 07/04/15 1124    Check-In   Staff Present Heath Lark, RN, BSN, CCRP;Carroll Enterkin, RN, Levie Heritage, MA, ACSM RCEP, Exercise Physiologist   Supervising physician immediately available to respond to emergencies [facility] immediately available ER MD   Physician(s) Drs: Burlene Arnt and Paduchowski   Medication changes reported     No   Fall or balance concerns reported    No   Warm-up and Cool-down Performed on first and last piece of equipment   Resistance Training Performed Yes   VAD Patient? No   Pain Assessment   Currently in Pain? No/denies         Goals Met:  Independence with exercise equipment Exercise tolerated well Strength training completed today  Goals Unmet:  Not Applicable  Comments: Doing well with exercise prescription progression.    Dr. Emily Filbert is Medical Director for [place] and [facility].

## 2015-07-07 ENCOUNTER — Ambulatory Visit (INDEPENDENT_AMBULATORY_CARE_PROVIDER_SITE_OTHER): Payer: Medicare Other | Admitting: Pulmonary Disease

## 2015-07-07 ENCOUNTER — Encounter: Payer: Self-pay | Admitting: Pulmonary Disease

## 2015-07-07 ENCOUNTER — Encounter: Payer: Self-pay | Admitting: *Deleted

## 2015-07-07 VITALS — BP 118/66 | HR 60 | Ht 63.0 in | Wt 191.0 lb

## 2015-07-07 DIAGNOSIS — J432 Centrilobular emphysema: Secondary | ICD-10-CM

## 2015-07-07 DIAGNOSIS — G4730 Sleep apnea, unspecified: Secondary | ICD-10-CM

## 2015-07-07 DIAGNOSIS — I272 Other secondary pulmonary hypertension: Secondary | ICD-10-CM

## 2015-07-07 DIAGNOSIS — I2721 Secondary pulmonary arterial hypertension: Secondary | ICD-10-CM

## 2015-07-07 DIAGNOSIS — R918 Other nonspecific abnormal finding of lung field: Secondary | ICD-10-CM | POA: Diagnosis not present

## 2015-07-07 NOTE — Assessment & Plan Note (Signed)
Due to her emphysema.  This appears to be a stable interval.  Plan: Will f/u 6 min walk distance from pulmonary rehab, will request No role for pulmonary vasodilators

## 2015-07-07 NOTE — Progress Notes (Signed)
Pulmonary Individual Treatment Plan  Patient Details  Name: Nicole Arnold MRN: [id number] Date of Birth: 12-Jun-1928 Referring Provider:        Pulmonary Rehab from 07/02/2015 in [facility] Cardiac and Pulmonary Rehab   Referring Provider  Nicole Maffucci MD      Initial Encounter Date:       Pulmonary Rehab from 07/02/2015 in [facility] Cardiac and Pulmonary Rehab   Referring Provider  Nicole Maffucci MD      Visit Diagnosis: Pulmonary arterial hypertension [facility])  Sleep apnea  Patient's Home Medications on Admission:  Current outpatient prescriptions:  .  ALPRAZolam (XANAX) 0.5 MG tablet, Take 0.5 mg by mouth 3 (three) times daily as needed for anxiety., Disp: , Rfl:  .  amLODipine (NORVASC) 10 MG tablet, Take 10 mg by mouth daily., Disp: , Rfl:  .  aspirin 81 MG tablet, Take 81 mg by mouth daily., Disp: , Rfl:  .  Calcium-Vitamin D (CALTRATE 600 PLUS-VIT D PO), Take 1 tablet by mouth 2 (two) times daily., Disp: , Rfl:  .  Ipratropium-Albuterol (COMBIVENT RESPIMAT) 20-100 MCG/ACT AERS respimat, Inhale 1 puff into the lungs every 6 (six) hours as needed for wheezing or shortness of breath. (Patient not taking: Reported on 07/07/2015), Disp: 3 Inhaler, Rfl: 3 .  lansoprazole (PREVACID) 30 MG capsule, Take 30 mg by mouth daily at 12 noon., Disp: , Rfl:  .  levothyroxine (SYNTHROID, LEVOTHROID) 50 MCG tablet, Take 50 mcg by mouth daily before breakfast., Disp: , Rfl:  .  Multiple Vitamins-Minerals (CENTRUM SILVER ADULT 50+ PO), Take 1 tablet by mouth daily., Disp: , Rfl:  .  PARoxetine (PAXIL) 20 MG tablet, Take 20 mg by mouth daily., Disp: , Rfl:  .  raloxifene (EVISTA) 60 MG tablet, Take 60 mg by mouth daily., Disp: , Rfl:  .  vitamin E 400 UNIT capsule, Take 400 Units by mouth daily., Disp: , Rfl:   Past Medical History: Past Medical History  Diagnosis Date  . Pancreatitis   . Cataract   . Chronic constipation   . GERD (gastroesophageal reflux disease)   . Osteopenia     Tobacco  Use: History  Smoking status  . Former Smoker -- 0.50 packs/day for 30 years  . Types: Cigarettes  . Quit date: 01/25/1993  Smokeless tobacco  . Never Used    Labs: Recent Review Flowsheet Data    There is no flowsheet data to display.       ADL UCSD:     Pulmonary Assessment Scores      05/13/15 0905       ADL UCSD   ADL Phase Entry     SOB Score total 48     Rest 0     Walk 2     Stairs 5     Bath 4     Dress 3     Shop 3        Pulmonary Function Assessment:     Pulmonary Function Assessment - 05/13/15 0905    Pulmonary Function Tests   RV% 49 %   DLCO% 50 %   Initial Spirometry Results   FVC% 119 %   FEV1% 88 %   FEV1/FVC Ratio 50   Comments Test date 07/31/2013   Post Bronchodilator Spirometry Results   FVC% 115 %   FEV1% 96 %   FEV1/FVC Ratio 56   Breath   Bilateral Breath Sounds Clear   Shortness of Breath Yes;Fear of Shortness of Breath;Limiting activity  Exercise Target Goals:    Exercise Program Goal: Individual exercise prescription set with THRR, safety & activity barriers. Participant demonstrates ability to understand and report RPE using BORG scale, to self-measure pulse accurately, and to acknowledge the importance of the exercise prescription.  Exercise Prescription Goal: Starting with aerobic activity 30 plus minutes a day, 3 days per week for initial exercise prescription. Provide home exercise prescription and guidelines that participant acknowledges understanding prior to discharge.  Activity Barriers & Risk Stratification:     Activity Barriers & Cardiac Risk Stratification - 05/13/15 0905    Activity Barriers & Cardiac Risk Stratification   Activity Barriers Shortness of Breath;Deconditioning;Muscular Weakness   Cardiac Risk Stratification Moderate      6 Minute Walk:     6 Minute Walk      05/13/15 1024       6 Minute Walk   Phase Initial     Distance 1175 feet     Walk Time 6 minutes     RPE 17      Perceived Dyspnea  4     Resting HR 64 bpm     Resting BP 134/70 mmHg     Max Ex. HR 115 bpm     Max Ex. BP 178/70 mmHg        Initial Exercise Prescription:     Initial Exercise Prescription - 07/02/15 1400    Date of Initial Exercise RX and Referring Provider   Referring Provider Nicole Maffucci MD      Perform Capillary Blood Glucose checks as needed.  Exercise Prescription Changes:     Exercise Prescription Changes      05/19/15 1500 05/26/15 1200 05/27/15 0800 05/28/15 1500 06/16/15 1100   Exercise Review   Progression  Yes Yes  Yes   Response to Exercise   Blood Pressure (Admit) 122/62 mmHg  120/68 mmHg  Data from 05/26/2015     Blood Pressure (Exercise)   174/82 mmHg     Blood Pressure (Exit) 120/66 mmHg  116/64 mmHg     Heart Rate (Admit) 65 bpm  64 bpm     Heart Rate (Exercise) 91 bpm  103 bpm     Heart Rate (Exit) 74 bpm  73 bpm     Oxygen Saturation (Admit) 98 %  99 %     Oxygen Saturation (Exercise) 91 %  93 %     Oxygen Saturation (Exit) 98 %  95 %     Rating of Perceived Exertion (Exercise) 11  13     Perceived Dyspnea (Exercise) 3  4     Symptoms   none     Comments   Nicole Arnold did not like the XR -too much stress on her back, so we changed to the BX.     Progression   Progression   Continue to progress workloads to maintain intensity without signs/symptoms of physical distress.     Resistance Training   Training Prescription Yes Yes Yes     Weight _0 Reps 10-12 10-12 10-12     Treadmill   MPH 1.8 1.8 2     Grade 0 0 0     Minutes _1 Recumbant Bike   Level  2 2     RPM  50 50     Minutes  15 15     NuStep   Level _2 Watts 40 40  40     Minutes _0 REL-XR   Level 2 --      Watts 50 --      Minutes 15 --      Biostep-RELP   Level    2 2   Watts    50 50   Minutes    15 15     06/18/15 1100 07/01/15 1400         Exercise Review   Progression Yes Yes      Response to Exercise   Blood Pressure  (Admit)  148/82 mmHg      Blood Pressure (Exercise)  142/80 mmHg      Blood Pressure (Exit)  132/74 mmHg      Heart Rate (Admit)  71 bpm      Heart Rate (Exercise)  104 bpm      Heart Rate (Exit)  74 bpm      Oxygen Saturation (Admit)  99 %      Oxygen Saturation (Exercise)  93 %      Oxygen Saturation (Exit)  97 %      Rating of Perceived Exertion (Exercise)  14      Perceived Dyspnea (Exercise)  4      Symptoms  None      Comments  Home Exercise Given 06/30/15      Duration  Progress to 45 minutes of aerobic exercise without signs/symptoms of physical distress      Intensity  THRR New  93-122      Progression   Progression  Continue to progress workloads to maintain intensity without signs/symptoms of physical distress.      Average METs  2.4      Resistance Training   Training Prescription Yes Yes      Weight 2 2      Reps 10-12 10-12      Interval Training   Interval Training  No      Treadmill   MPH  2      Grade  0      Minutes  15      METs  2.53      Recumbant Bike   Level  3      RPM  56      Watts  20      Minutes  15      NuStep   Level 3 3      Watts 40 39      Minutes 15 15      METs  2.2      Biostep-RELP   Level 2       Watts 50       Minutes 15       Home Exercise Plan   Plans to continue exercise at  [employer] (comment)  [employer]      Frequency  Add 3 additional days to program exercise sessions.         Exercise Comments:     Exercise Comments      05/26/15 1231 06/30/15 1210 07/01/15 1443       Exercise Comments Mahima did not like the XR machine because it hurt her back. She moved to the RB machine instead and tolerated it well with no back pain.  Reviewed home exercise with pt today.  Pt plans to continue to go to [facility] gym for exercise.  She has been doing the stepper, treadmill, and bike  there as well.  She was encouraged to go to the water aerobics classes as well for her back. Reviewed THR, pulse, RPE, sign and  symptoms, and when to call 911 or MD.  Also discussed weather considerations and indoor options.  We discussed purchasing a pulse oximeter for home use to know that her oxygen levels are good during exercise.Pt voiced understanding. Wren is dong well with her exercise progression.  She almost half way through the program.  At mid point we will do a 6 min walk test to assess her progress.  We aim to see continued progress.        Discharge Exercise Prescription (Final Exercise Prescription Changes):     Exercise Prescription Changes - 07/01/15 1400    Exercise Review   Progression Yes   Response to Exercise   Blood Pressure (Admit) 148/82 mmHg   Blood Pressure (Exercise) 142/80 mmHg   Blood Pressure (Exit) 132/74 mmHg   Heart Rate (Admit) 71 bpm   Heart Rate (Exercise) 104 bpm   Heart Rate (Exit) 74 bpm   Oxygen Saturation (Admit) 99 %   Oxygen Saturation (Exercise) 93 %   Oxygen Saturation (Exit) 97 %   Rating of Perceived Exertion (Exercise) 14   Perceived Dyspnea (Exercise) 4   Symptoms None   Comments Home Exercise Given 06/30/15   Duration Progress to 45 minutes of aerobic exercise without signs/symptoms of physical distress   Intensity THRR New  93-122   Progression   Progression Continue to progress workloads to maintain intensity without signs/symptoms of physical distress.   Average METs 2.4   Resistance Training   Training Prescription Yes   Weight 2   Reps 10-12   Interval Training   Interval Training No   Treadmill   MPH 2   Grade 0   Minutes 15   METs 2.53   Recumbant Bike   Level 3   RPM 56   Watts 20   Minutes 15   NuStep   Level 3   Watts 39   Minutes 15   METs 2.2   Home Exercise Plan   Plans to continue exercise at [employer] (comment)  [employer] [facility]   Frequency Add 3 additional days to program exercise sessions.       Nutrition:  Target Goals: Understanding of nutrition guidelines, daily intake of sodium <152m, cholesterol  <2071m calories 30% from fat and 7% or less from saturated fats, daily to have 5 or more servings of fruits and vegetables.  Biometrics:     Pre Biometrics - 05/13/15 1031    Pre Biometrics   Height _0  (1.6 m)   Weight 188 lb 12.8 oz (85.639 kg)   Waist Circumference 38 inches   Hip Circumference 46 inches   Waist to Hip Ratio 0.83 %   BMI (Calculated) 33.5       Nutrition Therapy Plan and Nutrition Goals:     Nutrition Therapy & Goals - 05/13/15 0905    Nutrition Therapy   Diet Prefers not to meet with the dietitian; Nicole PhKaufmanooks healthy, she does like sweets; low salt and less meats.      Nutrition Discharge: Rate Your Plate Scores:   Psychosocial: Target Goals: Acknowledge presence or absence of depression, maximize coping skills, provide positive support system. Participant is able to verbalize types and ability to use techniques and skills needed for reducing stress and depression.  Initial Review & Psychosocial Screening:     Initial Psych  Review & Screening - 05/13/15 [place]? Yes   Comments Nicole Klett has good family support from her husband and daughter who is a Marine scientist. Her main concern is her husband and his lack of activity and socialization. Nicole Zaugg loves people  and does have an active social life.   Barriers   Psychosocial barriers to participate in program The patient should benefit from training in stress management and relaxation.   Screening Interventions   Interventions Encouraged to exercise;Program counselor consult      Quality of Life Scores:     Quality of Life - 05/13/15 0905    Quality of Life Scores   Health/Function Pre 25.2 %   Socioeconomic Pre 30 %   Psych/Spiritual Pre 30 %   Family Pre 28.8 %   GLOBAL Pre 27.71 %      PHQ-9:     Recent Review Flowsheet Data    Depression screen [facility] 2/9 05/13/2015   Decreased Interest 0   Down, Depressed, Hopeless 0   PHQ - 2 Score 0    Altered sleeping 0   Tired, decreased energy 2   Change in appetite 0   Feeling bad or failure about yourself  0   Trouble concentrating 0   Moving slowly or fidgety/restless 0   Suicidal thoughts 0   PHQ-9 Score 2   Difficult doing work/chores Somewhat difficult      Psychosocial Evaluation and Intervention:     Psychosocial Evaluation - 05/19/15 1118    Psychosocial Evaluation & Interventions   Interventions Stress management education;Relaxation education;Encouraged to exercise with the program and follow exercise prescription   Comments Counselor met with Nicole. Worthey today for initial psychosocial evaluation.  She was in this program last year and has had a lot of health issues occur since that time.  Nicole. Zettel hurt her foot in November, had double pneumonia in December, and fell and broke 6 ribs in January of this year.  She is also having problems with an ovary currently and will be seeing a doctor about that soon.  Nicole. Salley has a strong support system with a spouse and living in a retirement community.  She reports sleeping well (maybe "too well") now with a CPAP.  She has a history of anxiety but is on medications that help her with this and no current symptoms.  She reports her mood is typically positive and her major stress are her health issues.  Nicole. Hefty has goals to increase her stamina and strength including breathing better.  She has been and will continue to work out at the gym at the retirement community where she resides.     Continued Psychosocial Services Needed Yes  Nicole. Dancel will benefit from the psychoeducational components of this program.        Psychosocial Re-Evaluation:     Psychosocial Re-Evaluation      06/25/15 1117           Psychosocial Re-Evaluation   Comments Counselor follow up with Nicole. Hardin Negus today.  She reports having been sick this past week and the Dr. treated her for a bronchial infection.  She states she is feeling better  now and worked out over the long weekend since this program was closed for the holiday.  Nicole. Cherian states she is feeling stronger since coming into this program and most of the machines are getting easier, with the exception of the treadmill.  She realizes  she has "30 extra pounds" on her that she didn't when she was here previously and that is impacting her ability to move with greater ease in generaly. Nicole. Bernard reports her mood is positive and she has minimal stress in her life at this time.  She also is intentionally eating healthier; decreasing her sugar intake and drinking mostly water. Counselor commended her on the hard work and progress made already in this program.           Education: Education Goals: Education classes will be provided on a weekly basis, covering required topics. Participant will state understanding/return demonstration of topics presented.  Learning Barriers/Preferences:     Learning Barriers/Preferences - 05/13/15 3295    Learning Barriers/Preferences   Learning Barriers None   Learning Preferences Group Instruction;Individual Instruction;Pictoral;Skilled Demonstration;Verbal Instruction;Video;Written Material      Education Topics: Initial Evaluation Education: - Verbal, written and demonstration of respiratory meds, RPE/PD scales, oximetry and breathing techniques. Instruction on use of nebulizers and MDIs: cleaning and proper use, rinsing mouth with steroid doses and importance of monitoring MDI activations.          Pulmonary Rehab from 06/30/2015 in [facility] Cardiac and Pulmonary Rehab   Date  05/13/15   Educator  LB   Instruction Review Code  2- meets goals/outcomes      General Nutrition Guidelines/Fats and Fiber: -Group instruction provided by verbal, written material, models and posters to present the general guidelines for heart healthy nutrition. Gives an explanation and review of dietary fats and fiber.      Pulmonary Rehab from 06/30/2015 in [facility]  Cardiac and Pulmonary Rehab   Date  06/30/15   Educator  CR   Instruction Review Code  2- meets goals/outcomes      Controlling Sodium/Reading Food Labels: -Group verbal and written material supporting the discussion of sodium use in heart healthy nutrition. Review and explanation with models, verbal and written materials for utilization of the food label.   Exercise Physiology & Risk Factors: - Group verbal and written instruction with models to review the exercise physiology of the cardiovascular system and associated critical values. Details cardiovascular disease risk factors and the goals associated with each risk factor.      Pulmonary Rehab from 06/30/2015 in [facility] Cardiac and Pulmonary Rehab   Date  05/13/15   Educator  LB2      Aerobic Exercise & Resistance Training: - Gives group verbal and written discussion on the health impact of inactivity. On the components of aerobic and resistive training programs and the benefits of this training and how to safely progress through these programs.   Flexibility, Balance, General Exercise Guidelines: - Provides group verbal and written instruction on the benefits of flexibility and balance training programs. Provides general exercise guidelines with specific guidelines to those with heart or lung disease. Demonstration and skill practice provided.      Pulmonary Rehab from 06/30/2015 in [facility] Cardiac and Pulmonary Rehab   Date  06/04/15   Educator  BS   Instruction Review Code  2- meets goals/outcomes      Stress Management: - Provides group verbal and written instruction about the health risks of elevated stress, cause of high stress, and healthy ways to reduce stress.      Pulmonary Rehab from 06/30/2015 in [facility] Cardiac and Pulmonary Rehab   Date  06/11/15   Educator  [facility]   Instruction Review Code  2- meets goals/outcomes      Depression: - Provides group verbal  and written instruction on the correlation between heart/lung disease  and depressed mood, treatment options, and the stigmas associated with seeking treatment.   Exercise & Equipment Safety: - Individual verbal instruction and demonstration of equipment use and safety with use of the equipment.   Infection Prevention: - Provides verbal and written material to individual with discussion of infection control including proper hand washing and proper equipment cleaning during exercise session.   Falls Prevention: - Provides verbal and written material to individual with discussion of falls prevention and safety.      Pulmonary Rehab from 06/30/2015 in [facility] Cardiac and Pulmonary Rehab   Date  05/13/15   Educator  LB   Instruction Review Code  2- meets goals/outcomes      Diabetes: - Individual verbal and written instruction to review signs/symptoms of diabetes, desired ranges of glucose level fasting, after meals and with exercise. Advice that pre and post exercise glucose checks will be done for 3 sessions at entry of program.   Chronic Lung Diseases: - Group verbal and written instruction to review new updates, new respiratory medications, new advancements in procedures and treatments. Provide informative websites and "800" numbers of self-education.      Pulmonary Rehab from 06/30/2015 in [facility] Cardiac and Pulmonary Rehab   Date  06/02/15   Educator  L. Owens Shark, RT   Instruction Review Code  2- meets goals/outcomes      Lung Procedures: - Group verbal and written instruction to describe testing methods done to diagnose lung disease. Review the outcome of test results. Describe the treatment choices: Pulmonary Function Tests, ABGs and oximetry.   Energy Conservation: - Provide group verbal and written instruction for methods to conserve energy, plan and organize activities. Instruct on pacing techniques, use of adaptive equipment and posture/positioning to relieve shortness of breath.   Triggers: - Group verbal and written instruction to review types of  environmental controls: home humidity, furnaces, filters, dust mite/pet prevention, HEPA vacuums. To discuss weather changes, air quality and the benefits of nasal washing.      Pulmonary Rehab from 06/30/2015 in [facility] Cardiac and Pulmonary Rehab   Date  05/19/15   Educator  LB   Instruction Review Code  2- meets goals/outcomes      Exacerbations: - Group verbal and written instruction to provide: warning signs, infection symptoms, calling MD promptly, preventive modes, and value of vaccinations. Review: effective airway clearance, coughing and/or vibration techniques. Create an Sports administrator.   Oxygen: - Individual and group verbal and written instruction on oxygen therapy. Includes supplement oxygen, available portable oxygen systems, continuous and intermittent flow rates, oxygen safety, concentrators, and Medicare reimbursement for oxygen.   Respiratory Medications: - Group verbal and written instruction to review medications for lung disease. Drug class, frequency, complications, importance of spacers, rinsing mouth after steroid MDI's, and proper cleaning methods for nebulizers.   AED/CPR: - Group verbal and written instruction with the use of models to demonstrate the basic use of the AED with the basic ABC's of resuscitation.      Pulmonary Rehab from 06/30/2015 in [facility] Cardiac and Pulmonary Rehab   Date  06/13/15   Educator  CE   Instruction Review Code  2- meets goals/outcomes      Breathing Retraining: - Provides individuals verbal and written instruction on purpose, frequency, and proper technique of diaphragmatic breathing and pursed-lipped breathing. Applies individual practice skills.      Pulmonary Rehab from 06/30/2015 in [facility] Cardiac and Pulmonary Rehab   Date  05/13/15   Educator  LB   Instruction Review Code  2- meets goals/outcomes      Anatomy and Physiology of the Lungs: - Group verbal and written instruction with the use of models to provide basic lung anatomy  and physiology related to function, structure and complications of lung disease.   Heart Failure: - Group verbal and written instruction on the basics of heart failure: signs/symptoms, treatments, explanation of ejection fraction, enlarged heart and cardiomyopathy.   Sleep Apnea: - Individual verbal and written instruction to review Obstructive Sleep Apnea. Review of risk factors, methods for diagnosing and types of masks and machines for OSA.   Anxiety: - Provides group, verbal and written instruction on the correlation between heart/lung disease and anxiety, treatment options, and management of anxiety.   Relaxation: - Provides group, verbal and written instruction about the benefits of relaxation for patients with heart/lung disease. Also provides patients with examples of relaxation techniques.      Pulmonary Rehab from 06/30/2015 in [facility] Cardiac and Pulmonary Rehab   Date  06/25/15   Educator  Kathreen Cornfield, [facility]   Instruction Review Code  2- Meets goals/outcomes      Knowledge Questionnaire Score:     Knowledge Questionnaire Score - 05/13/15 0905    Knowledge Questionnaire Score   Pre Score 6/10       Core Components/Risk Factors/Patient Goals at Admission:     Personal Goals and Risk Factors at Admission - 05/13/15 0905    Core Components/Risk Factors/Patient Goals on Admission    Weight Management Yes;Weight Loss   Intervention Weight Management: Develop a combined nutrition and exercise program designed to reach desired caloric intake, while maintaining appropriate intake of nutrient and fiber, sodium and fats, and appropriate energy expenditure required for the weight goal.;Weight Management: Provide education and appropriate resources to help participant work on and attain dietary goals.;Weight Management/Obesity: Establish reasonable short term and long term weight goals.;Obesity: Provide education and appropriate resources to help participant work on and attain dietary  goals.   Admit Weight 189 lb (85.73 kg)   Goal Weight: Short Term 184 lb (83.462 kg)   Goal Weight: Long Term 159 lb (72.122 kg)   Expected Outcomes Short Term: Continue to assess and modify interventions until short term weight is achieved;Long Term: Adherence to nutrition and physical activity/exercise program aimed toward attainment of established weight goal;Weight Maintenance: Understanding of the daily nutrition guidelines, which includes 25-35% calories from fat, 7% or less cal from saturated fats, less than 225m cholesterol, less than 1.5gm of sodium, & 5 or more servings of fruits and vegetables daily;Weight Loss: Understanding of general recommendations for a balanced deficit meal plan, which promotes 1-2 lb weight loss per week and includes a negative energy balance of [phone] kcal/d;Understanding recommendations for meals to include 15-35% energy as protein, 25-35% energy from fat, 35-60% energy from carbohydrates, less than 2038mof dietary cholesterol, 20-35 gm of total fiber daily;Understanding of distribution of calorie intake throughout the day with the consumption of 4-5 meals/snacks;Weight Gain: Understanding of general recommendations for a high calorie, high protein meal plan that promotes weight gain by distributing calorie intake throughout the day with the consumption for 4-5 meals, snacks, and/or supplements   Sedentary Yes   Intervention Provide advice, education, support and counseling about physical activity/exercise needs.;Develop an individualized exercise prescription for aerobic and resistive training based on initial evaluation findings, risk stratification, comorbidities and participant's personal goals.   Expected Outcomes Achievement of increased cardiorespiratory fitness and enhanced  flexibility, muscular endurance and strength shown through measurements of functional capacity and personal statement of participant.   Increase Strength and Stamina Yes   Intervention  Provide advice, education, support and counseling about physical activity/exercise needs.;Develop an individualized exercise prescription for aerobic and resistive training based on initial evaluation findings, risk stratification, comorbidities and participant's personal goals.   Expected Outcomes Achievement of increased cardiorespiratory fitness and enhanced flexibility, muscular endurance and strength shown through measurements of functional capacity and personal statement of participant.   Improve shortness of breath with ADL's Yes   Intervention Provide education, individualized exercise plan and daily activity instruction to help decrease symptoms of SOB with activities of daily living.   Expected Outcomes Short Term: Achieves a reduction of symptoms when performing activities of daily living.   Develop more efficient breathing techniques such as purse lipped breathing and diaphragmatic breathing; and practicing self-pacing with activity Yes   Intervention Provide education, demonstration and support about specific breathing techniuqes utilized for more efficient breathing. Include techniques such as pursed lipped breathing, diaphragmatic breathing and self-pacing activity.   Expected Outcomes Short Term: Participant will be able to demonstrate and use breathing techniques as needed throughout daily activities.   Increase knowledge of respiratory medications and ability to use respiratory devices properly  Yes   Intervention Provide education and demonstration as needed of appropriate use of medications, inhalers, and oxygen therapy.  Nicole Legere has Symbicort Respimat as needed and rarely uses; CPAP with Kell -full face mask+   Expected Outcomes Short Term: Achieves understanding of medications use. Understands that oxygen is a medication prescribed by physician. Demonstrates appropriate use of inhaler and oxygen therapy.      Core Components/Risk Factors/Patient Goals Review:       Goals and Risk Factor Review      05/19/15 1544 05/28/15 1000 06/02/15 1000 06/11/15 1525 06/13/15 1618   Core Components/Risk Factors/Patient Goals Review   Personal Goals Review Develop more efficient breathing techniques such as purse lipped breathing and diaphragmatic breathing and practicing self-pacing with activity. Sedentary Increase knowledge of respiratory medications and ability to use respiratory devices properly.;Improve shortness of breath with ADL's;Develop more efficient breathing techniques such as purse lipped breathing and diaphragmatic breathing and practicing self-pacing with activity.;Increase Strength and Stamina;Sedentary Develop more efficient breathing techniques such as purse lipped breathing and diaphragmatic breathing and practicing self-pacing with activity. Develop more efficient breathing techniques such as purse lipped breathing and diaphragmatic breathing and practicing self-pacing with activity.   Review Nicole Matchett used PLB breathing with her exercise goals today - good technique. Nicole Persky plans to increase her exercise by walking at [facility] walking track. She plans to walk on Tuesdays and Thursdays one mile. Nicole Glazer exercised at the [employer] on her off days from [employer]. She is determined to work on weight loss and increase her stamina and be less sedentary. Walking the track at [place] hurt her back, so she did use the treadmill and NuStep. we discussed her MDI's.. Nicole Erkkila rarely uses her Combivent. She deals with her shortness of breath by pacing and PLB. She states that she has improved her breath and notices this mainly with her housework. Nicole Zmuda is complaining of more shortness of breath. Her phusician ordered an ECHO test for next week. We discussed the importance of the test to look at her pulmonary arterial pressure which could be affecting her breathing. She does not have much awareness of PAH. Reviewed PLB technique with  Nicole.  Fahy.  She has some issues with her nose and says that it is hard to breath through her nose.  I taught her that the expiratory phase should be much longer that her inspiratory phase and if she needed to breath through her mouth that it was alright.   Expected Outcomes Continue using PLB breathing without cueing with her exercise and home activites. Increase her exercise to 5 days/week.  Educate Nicole Doerner on [facility]. PLB will decrease her SOB while exercising.     06/13/15 1621 06/18/15 1446         Core Components/Risk Factors/Patient Goals Review   Personal Goals Review Increase knowledge of respiratory medications and ability to use respiratory devices properly. Increase knowledge of respiratory medications and ability to use respiratory devices properly.;Improve shortness of breath with ADL's      Review Reviewed Nicole. Vollmer respiratory medications with her. She takes Combivent PRN and wears CPAP at night. Nicole Roznowski complained of congestion and shortness of breath when she arrived in [place]. She had mild scattered wheezes. Educated her on the importance of using her Combivent during episodes like this rather than taking it every day. Also educated her on the importance of calling her physician today and have an order for an antibiotic to preent an exacerbation.      Expected Outcomes She rarely takes her inhaler and is very compliant with her CPAP.  Nicole. Salley seems to have a good understanding of her respiratory medications and equipment. Use Combivent properly and react quickly to a change in her lung condition - call her physician immediately for therapy order.         Core Components/Risk Factors/Patient Goals at Discharge (Final Review):      Goals and Risk Factor Review - 06/18/15 1446    Core Components/Risk Factors/Patient Goals Review   Personal Goals Review Increase knowledge of respiratory medications and ability to use respiratory devices properly.;Improve shortness of  breath with ADL's   Review Nicole Kolk complained of congestion and shortness of breath when she arrived in [place]. She had mild scattered wheezes. Educated her on the importance of using her Combivent during episodes like this rather than taking it every day. Also educated her on the importance of calling her physician today and have an order for an antibiotic to preent an exacerbation.   Expected Outcomes Use Combivent properly and react quickly to a change in her lung condition - call her physician immediately for therapy order.      ITP Comments:     ITP Comments      05/19/15 1000 05/26/15 1000         ITP Comments Nicole Conradt completed her first day of exercise in [facility] and accomplished her exercise goals with acceptable vital signs and RPE/PD scales. She completed her weight goals and stayed for education class today. Nicole Philbrick is concerned about her results from a P125 lab test. An ultrasound was performed, and she is waiting for results.         Comments: 30 day review

## 2015-07-07 NOTE — Progress Notes (Signed)
Subjective:    Patient ID: Nicole Arnold, female    DOB: August 31, 1928, 82 y.o.   MRN: [id number]  Synopsis: First saw [facility] pulmonary in 2015 for evaluation of mild pulmonary hypertension seen on an echocardiogram. Found to have evidence of COPD.    07/2013 PFT> Ratio 56% FEV1 1.56L (96%, 9% change), TLC 3.85L (85% pred), DLCO 9.8 (50% pred)  April 2015 CT chest reviewed by me> there is centrilobular emphysema more prominent in the bases, there is scarring in her left base. There is mild bronchiectasis in the right middle lobe as well as in the lingula. There was mild tree in bud abnormalities in the right upper lobe, there were multiple scattered pulmonary nodules about 5 mm in size  April 2015 echocardiogram> LVEF 60%. Left ventricle is normal in size. RV is normal in size and function but there was a comment of mild dilatation and the RVSP was estimated to be 59 mmHg   HPI Chief Complaint  Patient presents with  . Follow-up    pt states she is doing well- pt has been doing pulm rehab at [facility] and states this has helped to improve her stamina.     Nicole Arnold started going to pulmonary rehab at [facility] again after a bad spell of problems in the winter.  Speicifcally she had a foot problem, then pneumonia, then a fall with rib fractures.  [facility] at [facility] on May 1 and it has gone really well.  She is now going three times a week and her dyspnea has improved.  When she had pneumonia she was treated with prednisone and antibiotics.  She said this helped.    She doesn't use the combivent at all now.  She used it when she had pneumonia.  Past Medical History  Diagnosis Date  . Pancreatitis   . Cataract   . Chronic constipation   . GERD (gastroesophageal reflux disease)   . Osteopenia      Review of Systems  Constitutional: Positive for fatigue. Negative for fever and chills.  HENT: Negative for nosebleeds, postnasal drip, rhinorrhea and sinus pressure.     Respiratory: Negative for cough, shortness of breath and wheezing.   Cardiovascular: Negative for chest pain, palpitations and leg swelling.  Musculoskeletal: Positive for joint swelling and arthralgias.       Objective:   Physical Exam Filed Vitals:   07/07/15 0953  BP: 118/66  Pulse: 60  Height: '5\' 3"'$  (1.6 m)  Weight: 86.637 kg (191 lb)  SpO2: 98%   Room air  Gen: well appearing, no acute distress HEENT: NCAT, EOMi, OP clear PULM: CTA B  CV: RRR, no mgr, no JVD AB: BS+, soft, nontender,  Ext: warm, no edema, no clubbing, no cyanosis MSK: left hand 5th digit swollen, red, inflamed, tender to touch Derm: no rash or skin breakdown Neuro: A&Ox4, MAEW  PET CT from 03/2015 [facility] IMPRESSION: - Hypermetabolic pancreatic mass, incompletely characterized on this noncontrasted study, but 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 No lymphadenopathy in the abdomen or pelvis. - Multiple acute right-sided rib fractures. - Activity in small right pleural effusion, which is indeterminate, but may be related to right rib fractures. - Activity in normal-appearing bilateral hilar and mediastinal lymph nodes, likely benign.  Images from the 03/2015 CT chest reviewed> Small right pleural effusion, a few small nodules in the right lung, evidence of emphysema, no pulmonary embolism      Assessment & Plan:   Emphysema lung (  [place]) Despite the emphysema and the round of pneumonia she had in the fall she has been doing well.  She has been benefitting from pulmonary rehab.  She only has mild airflow obstruction.   Immunizations are up to date.  Plan: Continue pulmonary rehab Continue combivent prn. F/u 6 months  Pulmonary hypertension (HCC) Due to her emphysema.  This appears to be a stable interval.  Plan: Will f/u 6 min walk distance from pulmonary rehab, will request No role for pulmonary vasodilators  Pulmonary nodules These nodules have been present for a number of years and  fortunately were not FDG avid on the recent PET scan. Because of their stability over time I see no indication for further imaging.    Updated Medication List Outpatient Encounter Prescriptions as of 07/07/2015  Medication Sig  . ALPRAZolam (XANAX) 0.5 MG tablet Take 0.5 mg by mouth 3 (three) times daily as needed for anxiety.  Marland Kitchen amLODipine (NORVASC) 10 MG tablet Take 10 mg by mouth daily.  Marland Kitchen aspirin 81 MG tablet Take 81 mg by mouth daily.  . Calcium-Vitamin D (CALTRATE 600 PLUS-VIT D PO) Take 1 tablet by mouth 2 (two) times daily.  . lansoprazole (PREVACID) 30 MG capsule Take 30 mg by mouth daily at 12 noon.  Marland Kitchen levothyroxine (SYNTHROID, LEVOTHROID) 50 MCG tablet Take 50 mcg by mouth daily before breakfast.  . Multiple Vitamins-Minerals (CENTRUM SILVER ADULT 50+ PO) Take 1 tablet by mouth daily.  Marland Kitchen PARoxetine (PAXIL) 20 MG tablet Take 20 mg by mouth daily.  . raloxifene (EVISTA) 60 MG tablet Take 60 mg by mouth daily.  . vitamin E 400 UNIT capsule Take 400 Units by mouth daily.  . Ipratropium-Albuterol (COMBIVENT RESPIMAT) 20-100 MCG/ACT AERS respimat Inhale 1 puff into the lungs every 6 (six) hours as needed for wheezing or shortness of breath. (Patient not taking: Reported on 07/07/2015)   No facility-administered encounter medications on file as of 07/07/2015.

## 2015-07-07 NOTE — Assessment & Plan Note (Addendum)
Despite the emphysema and the round of pneumonia she had in the fall she has been doing well.  She has been benefitting from pulmonary rehab.  She only has mild airflow obstruction.   Immunizations are up to date.  Plan: Continue pulmonary rehab Continue combivent prn. F/u 6 months

## 2015-07-07 NOTE — Assessment & Plan Note (Signed)
These nodules have been present for a number of years and fortunately were not FDG avid on the recent PET scan. Because of their stability over time I see no indication for further imaging.

## 2015-07-07 NOTE — Patient Instructions (Signed)
We will request records from her pulmonary rehab program so we can see your 6 minute walk results Continue combivent as needed We will see you back in 6 months

## 2015-07-09 DIAGNOSIS — G4730 Sleep apnea, unspecified: Secondary | ICD-10-CM

## 2015-07-09 DIAGNOSIS — I2721 Secondary pulmonary arterial hypertension: Secondary | ICD-10-CM

## 2015-07-09 NOTE — Progress Notes (Signed)
Daily Session Note  Patient Details  Name: Nicole Arnold MRN: [id number] Date of Birth: January 10, 1929 Referring Provider:        Pulmonary Rehab from 07/02/2015 in [facility]   Referring Provider  Simonne Maffucci MD      Encounter Date: 07/09/2015  Check In:     Session Check In - 07/09/15 1209    Check-In   Location [facility]-Cardiac & Pulmonary Rehab   Staff Present Heath Lark, RN, BSN, CCRP;Laureen Owens Shark, BS, RRT, Respiratory Dareen Piano, BA, ACSM CEP, Exercise Physiologist   Supervising physician immediately available to respond to emergencies [facility] immediately available ER MD   Physician(s) Archie Balboa and Reita Cliche   Medication changes reported     No   Fall or balance concerns reported    No   Warm-up and Cool-down Performed on first and last piece of equipment   Resistance Training Performed Yes   VAD Patient? No   Pain Assessment   Currently in Pain? No/denies   Multiple Pain Sites No         Goals Met:  Proper associated with RPD/PD & O2 Sat Independence with exercise equipment Exercise tolerated well Strength training completed today  Goals Unmet:  Not Applicable  Comments:      6 Minute Walk      05/13/15 1024 07/09/15 1213     6 Minute Walk   Phase Initial Mid Program    Distance 1175 feet 1150 feet    Walk Time 6 minutes 6 minutes    # of Rest Breaks  0    MPH  2.18    METS  2.26    RPE 17 13    Perceived Dyspnea  4 4    VO2 Peak  7.9    Symptoms  No    Resting HR 64 bpm 68 bpm    Resting BP 134/70 mmHg 120/64 mmHg    Max Ex. HR 115 bpm 126 bpm    Max Ex. BP 178/70 mmHg 188/74 mmHg    2 Minute Post BP  150/64 mmHg    Interval HR   Baseline HR  68    1 Minute HR  94    2 Minute HR  104    3 Minute HR  111    4 Minute HR  110    5 Minute HR  111    6 Minute HR  126    2 Minute Post HR  87    Interval Heart Rate?  Yes    Interval Oxygen   Interval Oxygen?  Yes    Baseline Oxygen Saturation %  98 %    1  Minute Oxygen Saturation %  92 %    2 Minute Oxygen Saturation %  92 %    3 Minute Oxygen Saturation %  91 %    4 Minute Oxygen Saturation %  91 %    5 Minute Oxygen Saturation %  91 %    6 Minute Oxygen Saturation %  89 %    2 Minute Post Oxygen Saturation %  97 %         Dr. Emily Filbert is Medical Director for [place] and [facility].

## 2015-07-11 ENCOUNTER — Encounter: Payer: Medicare Other | Admitting: Respiratory Therapy

## 2015-07-11 DIAGNOSIS — I2721 Secondary pulmonary arterial hypertension: Secondary | ICD-10-CM

## 2015-07-11 DIAGNOSIS — G4730 Sleep apnea, unspecified: Secondary | ICD-10-CM | POA: Diagnosis not present

## 2015-07-11 NOTE — Progress Notes (Signed)
Daily Session Note  Patient Details  Name: Nicole Arnold MRN: [id number] Date of Birth: [birth year] Referring Provider:        Pulmonary Rehab from 07/02/2015 in [facility] and Pulmonary Rehab   Referring Provider  Simonne Maffucci MD      Encounter Date: 07/11/2015  Check In:     Session Check In - 07/11/15 1241    Check-In   Location [facility]-Cardiac & Pulmonary Rehab   Staff Present Heath Lark, RN, BSN, CCRP;Carroll Enterkin, RN, Michaela Corner, RRT, RCP, Respiratory Therapist   Supervising physician immediately available to respond to emergencies [facility] immediately available ER MD   Physician(s) Dr. Doreene Nest and Jacqualine Code   Medication changes reported     No   Fall or balance concerns reported    No   Warm-up and Cool-down Performed on first and last piece of equipment   Resistance Training Performed Yes   VAD Patient? No   Pain Assessment   Currently in Pain? No/denies           Exercise Prescription Changes - 07/11/15 1200    Exercise Review   Progression Yes   Response to Exercise   Comments Home Exercise Given 06/30/15   Duration Progress to 45 minutes of aerobic exercise without signs/symptoms of physical distress   Intensity THRR New  93-122   Progression   Progression Continue to progress workloads to maintain intensity without signs/symptoms of physical distress.   Average METs 2.4   Resistance Training   Training Prescription Yes   Weight 2   Reps 10-12   Interval Training   Interval Training No   Treadmill   MPH 2   Grade 0.5   Minutes 15   METs 2.53   Recumbant Bike   Level 3   RPM 56   Watts 20   Minutes 15   NuStep   Level 3   Watts 39   Minutes 15   METs 2.2      Goals Met:  Proper associated with RPD/PD & O2 Sat Independence with exercise equipment Using PLB without cueing & demonstrates good technique Exercise tolerated well Strength training completed today  Goals Unmet:  Not Applicable  Comments: Pt able to follow  exercise prescription today without complaint.  Will continue to monitor for progression.    Dr. Emily Filbert is Medical Director for [place] and [facility].

## 2015-07-14 ENCOUNTER — Encounter: Payer: Medicare Other | Admitting: *Deleted

## 2015-07-14 DIAGNOSIS — G4730 Sleep apnea, unspecified: Secondary | ICD-10-CM

## 2015-07-14 DIAGNOSIS — I2721 Secondary pulmonary arterial hypertension: Secondary | ICD-10-CM

## 2015-07-14 NOTE — Progress Notes (Signed)
Daily Session Note  Patient Details  Name: Nicole Arnold MRN: [id number] Date of Birth: 1928/08/25 Referring Provider:        Pulmonary Rehab from 07/02/2015 in [facility] Rehab   Referring Provider  Simonne Maffucci MD      Encounter Date: 07/14/2015  Check In:     Session Check In - 07/14/15 1223    Check-In   Location [facility]-Cardiac & Pulmonary Rehab   Staff Present Carson Myrtle, BS, RRT, Respiratory Therapist;Zykiria Bruening Amedeo Plenty, BS, ACSM CEP, Exercise Physiologist;Jessica Luan Pulling, MA, ACSM RCEP, Exercise Physiologist   Supervising physician immediately available to respond to emergencies [facility] immediately available ER MD   Physician(s) Dr. Clearnce Hasten and Dr. Joni Fears   Medication changes reported     No   Fall or balance concerns reported    No   Warm-up and Cool-down Performed on first and last piece of equipment   Resistance Training Performed Yes   VAD Patient? No   Pain Assessment   Currently in Pain? No/denies   Multiple Pain Sites No         Goals Met:  Proper associated with RPD/PD & O2 Sat Independence with exercise equipment Exercise tolerated well Strength training completed today  Goals Unmet:  Not Applicable  Comments: Patient completed exercise prescription and all exercise goals during rehab session. The exercise was tolerated well and the patient is progressing in the program.     Dr. Emily Filbert is Medical Director for [place] and [facility].

## 2015-07-16 DIAGNOSIS — G4730 Sleep apnea, unspecified: Secondary | ICD-10-CM

## 2015-07-16 DIAGNOSIS — I2721 Secondary pulmonary arterial hypertension: Secondary | ICD-10-CM

## 2015-07-16 NOTE — Progress Notes (Signed)
Daily Session Note  Patient Details  Name: Nicole Arnold MRN: [id number] Date of Birth: February 17, 1928 Referring Provider:        Pulmonary Rehab from 07/02/2015 in [facility] Rehab   Referring Provider  Simonne Maffucci MD      Encounter Date: 07/16/2015  Check In:     Session Check In - 07/16/15 1238    Check-In   Location [facility]Cardiac & Pulmonary Rehab   Staff Present Heath Lark, RN, BSN, CCRP;Laureen Owens Shark, BS, RRT, Respiratory Dareen Piano, BA, ACSM CEP, Exercise Physiologist   Supervising physician immediately available to respond to emergencies [facility] immediately available ER MD   Physician(s) Lovena Le and Paduchowitz   Medication changes reported     No   Fall or balance concerns reported    No   Warm-up and Cool-down Performed on first and last piece of equipment   Resistance Training Performed Yes   VAD Patient? No   Pain Assessment   Currently in Pain? No/denies   Multiple Pain Sites No           Exercise Prescription Changes - 07/15/15 1300    Exercise Review   Progression Yes   Response to Exercise   Blood Pressure (Admit) 126/64 mmHg   Blood Pressure (Exercise) 146/76 mmHg   Blood Pressure (Exit) 120/60 mmHg   Heart Rate (Admit) 65 bpm   Heart Rate (Exercise) 100 bpm   Heart Rate (Exit) 70 bpm   Oxygen Saturation (Admit) 99 %   Oxygen Saturation (Exercise) 92 %   Oxygen Saturation (Exit) 95 %   Rating of Perceived Exertion (Exercise) 13   Perceived Dyspnea (Exercise) 3   Symptoms None   Comments Home Exercise Given 06/30/15   Duration Progress to 45 minutes of aerobic exercise without signs/symptoms of physical distress   Intensity THRR unchanged  93-122   Progression   Progression Continue to progress workloads to maintain intensity without signs/symptoms of physical distress.   Average METs 2.8   Resistance Training   Training Prescription Yes   Weight 3   Reps 10-12   Interval Training   Interval Training No   Treadmill   MPH 2   Grade 0.5   Minutes 15   METs 2.67   Recumbant Bike   Level 3   RPM 50   Watts 20   Minutes 15   METs 2.7   NuStep   Level 3   Watts 42   Minutes 15   METs 3   Home Exercise Plan   Plans to continue exercise at [employer] (comment)  [employer]   Frequency Add 3 additional days to program exercise sessions.      Goals Met:  Proper associated with RPD/PD & O2 Sat Independence with exercise equipment Exercise tolerated well Strength training completed today  Goals Unmet:  Not Applicable  Comments: Pt able to follow exercise prescription today without complaint.  Will continue to monitor for progression.    Dr. Emily Filbert is Medical Director for [place] and [facility].

## 2015-07-18 DIAGNOSIS — I2721 Secondary pulmonary arterial hypertension: Secondary | ICD-10-CM

## 2015-07-18 DIAGNOSIS — G4730 Sleep apnea, unspecified: Secondary | ICD-10-CM | POA: Diagnosis not present

## 2015-07-18 NOTE — Progress Notes (Signed)
Daily Session Note  Patient Details  Name: Nicole Arnold MRN: [id number] Date of Birth: 1928-03-20 Referring Provider:        Pulmonary Rehab from 07/02/2015 in [facility] Cardiac and Pulmonary Rehab   Referring Provider  Simonne Maffucci MD      Encounter Date: 07/18/2015  Check In:     Session Check In - 07/18/15 1055    Check-In   Location [facility]-Cardiac & Pulmonary Rehab   Staff Present Alberteen Sam, MA, ACSM RCEP, Exercise Physiologist;Amanda Oletta Darter, BA, ACSM CEP, Exercise Physiologist;Stacey Blanch Media, RRT, RCP, Respiratory Therapist   Supervising physician immediately available to respond to emergencies [facility] immediately available ER MD   Physician(s) Jimmye Norman and Lord   Medication changes reported     No   Fall or balance concerns reported    No   Warm-up and Cool-down Performed on first and last piece of equipment   Resistance Training Performed Yes   VAD Patient? No   Pain Assessment   Currently in Pain? No/denies   Multiple Pain Sites No         Goals Met:  Proper associated with RPD/PD & O2 Sat Independence with exercise equipment Exercise tolerated well Strength training completed today  Goals Unmet:  Not Applicable  Comments: Pt able to follow exercise prescription today without complaint.  Will continue to monitor for progression.    Dr. Emily Filbert is Medical Director for [place] and [facility].

## 2015-07-21 ENCOUNTER — Encounter: Payer: Medicare Other | Admitting: *Deleted

## 2015-07-21 DIAGNOSIS — G4730 Sleep apnea, unspecified: Secondary | ICD-10-CM

## 2015-07-21 DIAGNOSIS — I2721 Secondary pulmonary arterial hypertension: Secondary | ICD-10-CM

## 2015-07-21 NOTE — Progress Notes (Signed)
Daily Session Note  Patient Details  Name: Nicole Arnold MRN: [id number] Date of Birth: 29-Nov-1928 Referring Provider:        Pulmonary Rehab from 07/02/2015 in [facility] Cardiac and Pulmonary Rehab   Referring Provider  Simonne Maffucci MD      Encounter Date: 07/21/2015  Check In:     Session Check In - 07/21/15 1136    Check-In   Location [facility]-Cardiac & Pulmonary Rehab   Staff Present Carson Myrtle, BS, RRT, Respiratory Bertis Ruddy, BS, ACSM CEP, Exercise Physiologist;Mary Kellie Shropshire, RN, BSN, MA   Supervising physician immediately available to respond to emergencies [facility] immediately available ER MD   Physician(s) Marcelene Butte and Corky Downs   Medication changes reported     No   Fall or balance concerns reported    No   Warm-up and Cool-down Performed on first and last piece of equipment   Resistance Training Performed Yes   VAD Patient? No   Pain Assessment   Currently in Pain? No/denies   Multiple Pain Sites No         Goals Met:  Proper associated with RPD/PD & O2 Sat Independence with exercise equipment Exercise tolerated well Strength training completed today  Goals Unmet:  Not Applicable  Comments: Patient completed exercise prescription and all exercise goals during rehab session. The exercise was tolerated well and the patient is progressing in the program.     Dr. Emily Filbert is Medical Director for [place] and [facility].

## 2015-07-25 ENCOUNTER — Encounter: Payer: Medicare Other | Admitting: *Deleted

## 2015-07-25 DIAGNOSIS — I2721 Secondary pulmonary arterial hypertension: Secondary | ICD-10-CM

## 2015-07-25 DIAGNOSIS — G4730 Sleep apnea, unspecified: Secondary | ICD-10-CM | POA: Diagnosis not present

## 2015-07-25 NOTE — Progress Notes (Signed)
Daily Session Note  Patient Details  Name: ASHLLEY BOOHER MRN: [id number] Date of Birth: 01-06-1929 Referring Provider:        Pulmonary Rehab from 07/02/2015 in [facility] Cardiac and Pulmonary Rehab   Referring Provider  Simonne Maffucci MD      Encounter Date: 07/25/2015  Check In:     Session Check In - 07/25/15 1111    Check-In   Location [facility]-Cardiac & Pulmonary Rehab   Staff Present Heath Lark, RN, BSN, CCRP;Aizen Duval, RN, Alex Gardener, DPT, CEEA   Supervising physician immediately available to respond to emergencies [facility] immediately available ER MD   Physician(s) Dr. Jimmye Norman, Dr. Corky Downs   Medication changes reported     No   Fall or balance concerns reported    No   Warm-up and Cool-down Performed on first and last piece of equipment   Resistance Training Performed Yes   VAD Patient? No   Pain Assessment   Currently in Pain? No/denies         Goals Met:  Proper associated with RPD/PD & O2 Sat Exercise tolerated well  Goals Unmet:  Not Applicable  Comments:     Dr. Emily Filbert is Medical Director for [place] and [facility].

## 2015-07-28 ENCOUNTER — Encounter: Payer: Medicare Other | Attending: Pulmonary Disease | Admitting: *Deleted

## 2015-07-28 DIAGNOSIS — I272 Other secondary pulmonary hypertension: Secondary | ICD-10-CM | POA: Diagnosis present

## 2015-07-28 DIAGNOSIS — I2721 Secondary pulmonary arterial hypertension: Secondary | ICD-10-CM

## 2015-07-28 DIAGNOSIS — G4730 Sleep apnea, unspecified: Secondary | ICD-10-CM | POA: Diagnosis present

## 2015-07-28 NOTE — Progress Notes (Signed)
Daily Session Note  Patient Details  Name: Nicole Arnold MRN: [id number] Date of Birth: 07/11/1928 Referring Provider:        Pulmonary Rehab from 07/02/2015 in [facility] Cardiac and Pulmonary Rehab   Referring Provider  Simonne Maffucci MD      Encounter Date: 07/28/2015  Check In:     Session Check In - 07/28/15 1243    Check-In   Location [facility]-Cardiac & Pulmonary Rehab   Staff Present Carson Myrtle, BS, RRT, Respiratory Bertis Ruddy, BS, ACSM CEP, Exercise Physiologist;Mary Kellie Shropshire, RN, BSN, MA   Supervising physician immediately available to respond to emergencies [facility] immediately available ER MD   Physician(s) Jimmye Norman and Joni Fears   Medication changes reported     No   Fall or balance concerns reported    No   Warm-up and Cool-down Performed on first and last piece of equipment   Resistance Training Performed Yes   VAD Patient? No   Pain Assessment   Currently in Pain? No/denies   Multiple Pain Sites No         Goals Met:  Proper associated with RPD/PD & O2 Sat Independence with exercise equipment Exercise tolerated well Strength training completed today  Goals Unmet:  Not Applicable  Comments: Patient completed exercise prescription and all exercise goals during rehab session. The exercise was tolerated well and the patient is progressing in the program.     Dr. Emily Filbert is Medical Director for [place] and [facility].

## 2015-07-30 ENCOUNTER — Encounter: Payer: Medicare Other | Admitting: *Deleted

## 2015-07-30 DIAGNOSIS — G4730 Sleep apnea, unspecified: Secondary | ICD-10-CM | POA: Diagnosis not present

## 2015-07-30 DIAGNOSIS — I2721 Secondary pulmonary arterial hypertension: Secondary | ICD-10-CM

## 2015-07-30 NOTE — Progress Notes (Signed)
Daily Session Note  Patient Details  Name: Nicole Arnold MRN: [id number] Date of Birth: 14-Jul-1928 Referring Provider:        Pulmonary Rehab from 07/02/2015 in [facility] Cardiac and Pulmonary Rehab   Referring Provider  Simonne Maffucci MD      Encounter Date: 07/30/2015  Check In:     Session Check In - 07/30/15 1121    Check-In   Location [facility]-Cardiac & Pulmonary Rehab   Staff Present Alberteen Sam, MA, ACSM RCEP, Exercise Physiologist;Amanda Oletta Darter, BA, ACSM CEP, Exercise Physiologist;Laureen Owens Shark, BS, RRT, Respiratory Therapist   Supervising physician immediately available to respond to emergencies [facility] immediately available ER MD   Physician(s) Alfred Levins and Marcelene Butte   Medication changes reported     No   Fall or balance concerns reported    No   Warm-up and Cool-down Performed on first and last piece of equipment   Resistance Training Performed Yes   VAD Patient? No   Pain Assessment   Currently in Pain? No/denies   Multiple Pain Sites No         Goals Met:  Proper associated with RPD/PD & O2 Sat Independence with exercise equipment Using PLB without cueing & demonstrates good technique Exercise tolerated well Strength training completed today  Goals Unmet:  Not Applicable  Comments: Pt able to follow exercise prescription today without complaint.  Will continue to monitor for progression.    Dr. Emily Filbert is Medical Director for [place] and [facility].

## 2015-08-01 ENCOUNTER — Encounter: Payer: Medicare Other | Admitting: *Deleted

## 2015-08-01 DIAGNOSIS — G4730 Sleep apnea, unspecified: Secondary | ICD-10-CM | POA: Diagnosis not present

## 2015-08-01 DIAGNOSIS — I2721 Secondary pulmonary arterial hypertension: Secondary | ICD-10-CM

## 2015-08-01 NOTE — Progress Notes (Signed)
Daily Session Note  Patient Details  Name: Nicole Arnold MRN: [id number] Date of Birth: 11-12-1928 Referring Provider:        Pulmonary Rehab from 07/02/2015 in [facility] Cardiac and Pulmonary Rehab   Referring Provider  Simonne Maffucci MD      Encounter Date: 08/01/2015  Check In:     Session Check In - 08/01/15 1052    Check-In   Location [facility]-Cardiac & Pulmonary Rehab   Staff Present Nyoka Cowden, RN, BSN, [place], BS, RRT, Respiratory Therapist;Hyun Marsalis, RN, BSN   Supervising physician immediately available to respond to emergencies [facility] immediately available ER MD   Physician(s) Dr. Joni Fears and dr. Edd Fabian   Medication changes reported     No   Fall or balance concerns reported    No   Warm-up and Cool-down Performed on first and last piece of equipment   Resistance Training Performed Yes   VAD Patient? No   Pain Assessment   Currently in Pain? No/denies         Goals Met:  Proper associated with RPD/PD & O2 Sat Exercise tolerated well  Goals Unmet:  Not Applicable  Comments:     Dr. Emily Filbert is Medical Director for [place] and [facility].

## 2015-08-04 ENCOUNTER — Encounter: Payer: Self-pay | Admitting: *Deleted

## 2015-08-04 ENCOUNTER — Encounter: Payer: Medicare Other | Admitting: *Deleted

## 2015-08-04 DIAGNOSIS — G4730 Sleep apnea, unspecified: Secondary | ICD-10-CM

## 2015-08-04 DIAGNOSIS — I2721 Secondary pulmonary arterial hypertension: Secondary | ICD-10-CM

## 2015-08-04 NOTE — Progress Notes (Signed)
Pulmonary Individual Treatment Plan  Patient Details  Name: Nicole Arnold MRN: [id number] Date of Birth: 1928/09/12 Referring Provider:        Pulmonary Rehab from 07/02/2015 in [facility] Cardiac and Pulmonary Rehab   Referring Provider  Simonne Maffucci MD      Initial Encounter Date:       Pulmonary Rehab from 07/02/2015 in [facility] Cardiac and Pulmonary Rehab   Referring Provider  Simonne Maffucci MD      Visit Diagnosis: Pulmonary arterial hypertension [facility])  Sleep apnea  Patient's Home Medications on Admission:  Current outpatient prescriptions:  .  ALPRAZolam (XANAX) 0.5 MG tablet, Take 0.5 mg by mouth 3 (three) times daily as needed for anxiety., Disp: , Rfl:  .  amLODipine (NORVASC) 10 MG tablet, Take 10 mg by mouth daily., Disp: , Rfl:  .  aspirin 81 MG tablet, Take 81 mg by mouth daily., Disp: , Rfl:  .  Calcium-Vitamin D (CALTRATE 600 PLUS-VIT D PO), Take 1 tablet by mouth 2 (two) times daily., Disp: , Rfl:  .  Ipratropium-Albuterol (COMBIVENT RESPIMAT) 20-100 MCG/ACT AERS respimat, Inhale 1 puff into the lungs every 6 (six) hours as needed for wheezing or shortness of breath. (Patient not taking: Reported on 07/07/2015), Disp: 3 Inhaler, Rfl: 3 .  lansoprazole (PREVACID) 30 MG capsule, Take 30 mg by mouth daily at 12 noon., Disp: , Rfl:  .  levothyroxine (SYNTHROID, LEVOTHROID) 50 MCG tablet, Take 50 mcg by mouth daily before breakfast., Disp: , Rfl:  .  Multiple Vitamins-Minerals (CENTRUM SILVER ADULT 50+ PO), Take 1 tablet by mouth daily., Disp: , Rfl:  .  PARoxetine (PAXIL) 20 MG tablet, Take 20 mg by mouth daily., Disp: , Rfl:  .  raloxifene (EVISTA) 60 MG tablet, Take 60 mg by mouth daily., Disp: , Rfl:  .  vitamin E 400 UNIT capsule, Take 400 Units by mouth daily., Disp: , Rfl:   Past Medical History: Past Medical History  Diagnosis Date  . Pancreatitis   . Cataract   . Chronic constipation   . GERD (gastroesophageal reflux disease)   . Osteopenia     Tobacco  Use: History  Smoking status  . Former Smoker -- 0.50 packs/day for 30 years  . Types: Cigarettes  . Quit date: 01/25/1993  Smokeless tobacco  . Never Used    Labs: Recent Review Flowsheet Data    There is no flowsheet data to display.       ADL UCSD:     Pulmonary Assessment Scores      05/13/15 0905 07/09/15 1000     ADL UCSD   ADL Phase Entry Mid    SOB Score total 48 55    Rest 0 0    Walk 2 1    Stairs 5 5    Bath 4 2    Dress 3 2    Shop 3 1       Pulmonary Function Assessment:     Pulmonary Function Assessment - 05/13/15 0905    Pulmonary Function Tests   RV% 49 %   DLCO% 50 %   Initial Spirometry Results   FVC% 119 %   FEV1% 88 %   FEV1/FVC Ratio 50   Comments Test date 07/31/2013   Post Bronchodilator Spirometry Results   FVC% 115 %   FEV1% 96 %   FEV1/FVC Ratio 56   Breath   Bilateral Breath Sounds Clear   Shortness of Breath Yes;Fear of Shortness of Breath;Limiting activity  Exercise Target Goals:    Exercise Program Goal: Individual exercise prescription set with THRR, safety & activity barriers. Participant demonstrates ability to understand and report RPE using BORG scale, to self-measure pulse accurately, and to acknowledge the importance of the exercise prescription.  Exercise Prescription Goal: Starting with aerobic activity 30 plus minutes a day, 3 days per week for initial exercise prescription. Provide home exercise prescription and guidelines that participant acknowledges understanding prior to discharge.  Activity Barriers & Risk Stratification:     Activity Barriers & Cardiac Risk Stratification - 05/13/15 0905    Activity Barriers & Cardiac Risk Stratification   Activity Barriers Shortness of Breath;Deconditioning;Muscular Weakness   Cardiac Risk Stratification Moderate      6 Minute Walk:     6 Minute Walk      05/13/15 1024 07/09/15 1213     6 Minute Walk   Phase Initial Mid Program    Distance 1175 feet  1150 feet    Walk Time 6 minutes 6 minutes    # of Rest Breaks  0    MPH  2.18    METS  2.26    RPE 17 13    Perceived Dyspnea  4 4    VO2 Peak  7.9    Symptoms  No    Resting HR 64 bpm 68 bpm    Resting BP 134/70 mmHg 120/64 mmHg    Max Ex. HR 115 bpm 126 bpm    Max Ex. BP 178/70 mmHg 188/74 mmHg    2 Minute Post BP  150/64 mmHg    Interval HR   Baseline HR  68    1 Minute HR  94    2 Minute HR  104    3 Minute HR  111    4 Minute HR  110    5 Minute HR  111    6 Minute HR  126    2 Minute Post HR  87    Interval Heart Rate?  Yes    Interval Oxygen   Interval Oxygen?  Yes    Baseline Oxygen Saturation %  98 %    1 Minute Oxygen Saturation %  92 %    2 Minute Oxygen Saturation %  92 %    3 Minute Oxygen Saturation %  91 %    4 Minute Oxygen Saturation %  91 %    5 Minute Oxygen Saturation %  91 %    6 Minute Oxygen Saturation %  89 %    2 Minute Post Oxygen Saturation %  97 %       Initial Exercise Prescription:     Initial Exercise Prescription - 07/02/15 1400    Date of Initial Exercise RX and Referring Provider   Referring Provider Simonne Maffucci MD      Perform Capillary Blood Glucose checks as needed.  Exercise Prescription Changes:     Exercise Prescription Changes      05/19/15 1500 05/26/15 1200 05/27/15 0800 05/28/15 1500 06/16/15 1100   Exercise Review   Progression  Yes Yes  Yes   Response to Exercise   Blood Pressure (Admit) 122/62 mmHg  120/68 mmHg  Data from 05/26/2015     Blood Pressure (Exercise)   174/82 mmHg     Blood Pressure (Exit) 120/66 mmHg  116/64 mmHg     Heart Rate (Admit) 65 bpm  64 bpm     Heart Rate (Exercise) 91 bpm  103 bpm  Heart Rate (Exit) 74 bpm  73 bpm     Oxygen Saturation (Admit) 98 %  99 %     Oxygen Saturation (Exercise) 91 %  93 %     Oxygen Saturation (Exit) 98 %  95 %     Rating of Perceived Exertion (Exercise) 11  13     Perceived Dyspnea (Exercise) 3  4     Symptoms   none     Comments   Ms Gable  did not like the XR -too much stress on her back, so we changed to the BX.     Progression   Progression   Continue to progress workloads to maintain intensity without signs/symptoms of physical distress.     Resistance Training   Training Prescription Yes Yes Yes     Weight _0 Reps 10-12 10-12 10-12     Treadmill   MPH 1.8 1.8 2     Grade 0 0 0     Minutes _1 Recumbant Bike   Level  2 2     RPM  50 50     Minutes  15 15     NuStep   Level _2 Watts 40 40 40     Minutes _3 REL-XR   Level 2 --      Watts 50 --      Minutes 15 --      Biostep-RELP   Level    2 2   Watts    50 50   Minutes    15 15     06/18/15 1100 07/01/15 1400 07/11/15 1200 07/15/15 1300 07/30/15 1400   Exercise Review   Progression _4    Response to Exercise   Blood Pressure (Admit)  148/82 mmHg  126/64 mmHg 120/68 mmHg   Blood Pressure (Exercise)  142/80 mmHg  146/76 mmHg 142/70 mmHg   Blood Pressure (Exit)  132/74 mmHg  120/60 mmHg 120/60 mmHg   Heart Rate (Admit)  71 bpm  65 bpm 67 bpm   Heart Rate (Exercise)  104 bpm  100 bpm 90 bpm   Heart Rate (Exit)  74 bpm  70 bpm 71 bpm   Oxygen Saturation (Admit)  99 %  99 % 98 %   Oxygen Saturation (Exercise)  93 %  92 % 95 %   Oxygen Saturation (Exit)  97 %  95 % 95 %   Rating of Perceived Exertion (Exercise)  _5 Perceived Dyspnea (Exercise)  _6 Symptoms  None  None None   Comments  Home Exercise Given 06/30/15 Home Exercise Given 06/30/15 Home Exercise Given 06/30/15 Home Exercise Given 06/30/15   Duration  Progress to 45 minutes of aerobic exercise without signs/symptoms of physical distress Progress to 45 minutes of aerobic exercise without signs/symptoms of physical distress Progress to 45 minutes of aerobic exercise without signs/symptoms of physical distress Progress to 45 minutes of aerobic exercise without signs/symptoms of physical distress   Intensity  THRR New  93-122 THRR New  93-122 THRR  unchanged  93-122 THRR unchanged   Progression   Progression  Continue to progress workloads to maintain intensity without signs/symptoms of physical distress. Continue to progress workloads to maintain intensity without signs/symptoms of physical distress. Continue to progress workloads to maintain intensity without signs/symptoms of  physical distress. Continue to progress workloads to maintain intensity without signs/symptoms of physical distress.   Average METs  2.4 2.4 2.8 2.9   Resistance Training   Training Prescription _0    Weight _1 Reps 10-12 10-12 10-12 10-12 10-12   Interval Training   Interval Training  No No No No   Treadmill   MPH  _2 Grade  0 0.5 0.5 0   Minutes  _3 METs  2.53 2.53 2.67 2.53   Recumbant Bike   Level  _4 RPM  56 56 50 60   Watts  _5 Minutes  _6 METs    2.7 3   NuStep   Level _7 Watts 40 39 39 42 55   Minutes _8 METs  2.2 2.2 3 2.9   Biostep-RELP   Level 2       Watts 50       Minutes 15       Home Exercise Plan   Plans to continue exercise at  [employer] (comment)  [place] (comment)  [employer] (comment)  [employer]   Frequency  Add 3 additional days to program exercise sessions.  Add 3 additional days to program exercise sessions. Add 3 additional days to program exercise sessions.      Exercise Comments:     Exercise Comments      05/26/15 1231 06/30/15 1210 07/01/15 1443 07/09/15 1219 07/15/15 1350   Exercise Comments Leda Gauze did not like the XR machine because it hurt her back. She moved to the RB machine instead and tolerated it well with no back pain.  Reviewed home exercise with pt today.  Pt plans to continue to go to [facility] gym for exercise.  She has been doing the stepper, treadmill, and bike there as well.  She was encouraged to go to the water aerobics classes as well for  her back. Reviewed THR, pulse, RPE, sign and symptoms, and when to call 911 or MD.  Also discussed weather considerations and indoor options.  We discussed purchasing a pulse oximeter for home use to know that her oxygen levels are good during exercise.Pt voiced understanding. Garnetta is dong well with her exercise progression.  She almost half way through the program.  At mid point we will do a 6 min walk test to assess her progress.  We aim to see continued progress. Walk test was 25 feet less than pre test.  Leda Gauze did Nustep at [facility] yesterday and stated her legs are sore today.  She has increased her intensity level in regular sessions on equipment. Brileigh is doing well with her exercise.  She is going to [place] on her off days.  We have added incline to her treadmil now.  We will continue to work with her to make progress toward improving her six minute walk test.     07/30/15 1434           Exercise Comments Makaila continues to do well with exercise.  She has continued to see good progress.  We will keep working with her to see more sucess.          Discharge Exercise Prescription (  Final Exercise Prescription Changes):     Exercise Prescription Changes - 07/30/15 1400    Exercise Review   Progression Yes   Response to Exercise   Blood Pressure (Admit) 120/68 mmHg   Blood Pressure (Exercise) 142/70 mmHg   Blood Pressure (Exit) 120/60 mmHg   Heart Rate (Admit) 67 bpm   Heart Rate (Exercise) 90 bpm   Heart Rate (Exit) 71 bpm   Oxygen Saturation (Admit) 98 %   Oxygen Saturation (Exercise) 95 %   Oxygen Saturation (Exit) 95 %   Rating of Perceived Exertion (Exercise) 13   Perceived Dyspnea (Exercise) 2   Symptoms None   Comments Home Exercise Given 06/30/15   Duration Progress to 45 minutes of aerobic exercise without signs/symptoms of physical distress   Intensity THRR unchanged   Progression   Progression Continue to progress workloads to maintain intensity without  signs/symptoms of physical distress.   Average METs 2.9   Resistance Training   Training Prescription Yes   Weight 3   Reps 10-12   Interval Training   Interval Training No   Treadmill   MPH 2   Grade 0   Minutes 15   METs 2.53   Recumbant Bike   Level 4   RPM 60   Minutes 15   METs 3   NuStep   Level 4   Watts 55   Minutes 15   METs 2.9   Home Exercise Plan   Plans to continue exercise at [employer] (comment)  [employer]   Frequency Add 3 additional days to program exercise sessions.       Nutrition:  Target Goals: Understanding of nutrition guidelines, daily intake of sodium <1546m, cholesterol <2088m calories 30% from fat and 7% or less from saturated fats, daily to have 5 or more servings of fruits and vegetables.  Biometrics:     Pre Biometrics - 05/13/15 1031    Pre Biometrics   Height 5' 3" (1.6 m)   Weight 188 lb 12.8 oz (85.639 kg)   Waist Circumference 38 inches   Hip Circumference 46 inches   Waist to Hip Ratio 0.83 %   BMI (Calculated) 33.5       Nutrition Therapy Plan and Nutrition Goals:     Nutrition Therapy & Goals - 05/13/15 0905    Nutrition Therapy   Diet Prefers not to meet with the dietitian; Ms PhLisbonooks healthy, she does like sweets; low salt and less meats.      Nutrition Discharge: Rate Your Plate Scores:   Psychosocial: Target Goals: Acknowledge presence or absence of depression, maximize coping skills, provide positive support system. Participant is able to verbalize types and ability to use techniques and skills needed for reducing stress and depression.  Initial Review & Psychosocial Screening:     Initial Psych Review & Screening - 05/13/15 [place]   Comments Ms PhGlickas good family support from her husband and daughter who is a nuMarine scientistHer main concern is her husband and his lack of activity and socialization. Ms PhRommeloves people  and does have an  active social life.   Barriers   Psychosocial barriers to participate in program The patient should benefit from training in stress management and relaxation.   Screening Interventions   Interventions Encouraged to exercise;Program counselor consult      Quality of Life Scores:     Quality of Life - 05/13/15 [id number]  Quality of Life Scores   Health/Function Pre 25.2 %   Socioeconomic Pre 30 %   Psych/Spiritual Pre 30 %   Family Pre 28.8 %   GLOBAL Pre 27.71 %      PHQ-9:     Recent Review Flowsheet Data    Depression screen [facility] 2/9 05/13/2015   Decreased Interest 0   Down, Depressed, Hopeless 0   PHQ - 2 Score 0   Altered sleeping 0   Tired, decreased energy 2   Change in appetite 0   Feeling bad or failure about yourself  0   Trouble concentrating 0   Moving slowly or fidgety/restless 0   Suicidal thoughts 0   PHQ-9 Score 2   Difficult doing work/chores Somewhat difficult      Psychosocial Evaluation and Intervention:     Psychosocial Evaluation - 05/19/15 1118    Psychosocial Evaluation & Interventions   Interventions Stress management education;Relaxation education;Encouraged to exercise with the program and follow exercise prescription   Comments Counselor met with Ms. Garfield today for initial psychosocial evaluation.  She was in this program last year and has had a lot of health issues occur since that time.  Ms. Dardis hurt her foot in November, had double pneumonia in December, and fell and broke 6 ribs in January of this year.  She is also having problems with an ovary currently and will be seeing a doctor about that soon.  Ms. Kiernan has a strong support system with a spouse and living in a retirement community.  She reports sleeping well (maybe "too well") now with a CPAP.  She has a history of anxiety but is on medications that help her with this and no current symptoms.  She reports her mood is typically positive and her major stress are her health  issues.  Ms. Savell has goals to increase her stamina and strength including breathing better.  She has been and will continue to work out at the gym at the retirement community where she resides.     Continued Psychosocial Services Needed Yes  Ms. Shiplett will benefit from the psychoeducational components of this program.        Psychosocial Re-Evaluation:     Psychosocial Re-Evaluation      06/25/15 1117 07/09/15 1057 07/28/15 1136       Psychosocial Re-Evaluation   Interventions   Relaxation education     Comments Counselor follow up with Ms. Hardin Negus today.  She reports having been sick this past week and the Dr. treated her for a bronchial infection.  She states she is feeling better now and worked out over the long weekend since this program was closed for the holiday.  Ms. Quintin states she is feeling stronger since coming into this program and most of the machines are getting easier, with the exception of the treadmill.  She realizes she has "30 extra pounds" on her that she didn't when she was here previously and that is impacting her ability to move with greater ease in generaly. Ms. Posada reports her mood is positive and she has minimal stress in her life at this time.  She also is intentionally eating healthier; decreasing her sugar intake and drinking mostly water. Counselor commended her on the hard work and progress made already in this program.   Follow up with Ms. Ignasiak today reporting she is enjoying this program and realizes she will need a structured schedule of exercise to maintain her consistency upon completion of this program.  Ms. Gadea reports that will likely be the [facility] [employer]ne[employer] program here at [facility] and she plans to work out with her husband some in their retirement community gym as well.  Counselor commended Ms. Regnier for her commitment to exercise and knowing her needs for consistency and structure to maintain this.   Counselor follow up with Ms.  Stantz today reporting she is having trouble breathing today and has been "worrying" more lately.  It appears that Ms. Dicola had some blood work indicating levels are increasing and may need to have a biopsy on a spot on her pancreas as well as surgery to remove the remainder of her left ovary due to some concerns expressed by her Dr.  Jamesetta So. Hardin Negus plans to go back to [facility] site to have additional blood work done prior to moving forward with this recommendation and expressed fear about "being put to sleep."  Counselor processed this with Ms. Szuch and discussed ways to manage stress and to change her "self talk" to being more helpful and less fear-producing.  Counselor will continue to follow with Ms. Corlett who reported she felt better after talking about this.  Counselor encouraged her to share with a close friend her fears and concerns since she is trying to protect her spouse and daughter since she states they are "more worried" than she is currently.           Education: Education Goals: Education classes will be provided on a weekly basis, covering required topics. Participant will state understanding/return demonstration of topics presented.  Learning Barriers/Preferences:     Learning Barriers/Preferences - 05/13/15 5038    Learning Barriers/Preferences   Learning Barriers None   Learning Preferences Group Instruction;Individual Instruction;Pictoral;Skilled Demonstration;Verbal Instruction;Video;Written Material      Education Topics: Initial Evaluation Education: - Verbal, written and demonstration of respiratory meds, RPE/PD scales, oximetry and breathing techniques. Instruction on use of nebulizers and MDIs: cleaning and proper use, rinsing mouth with steroid doses and importance of monitoring MDI activations.          Pulmonary Rehab from 07/25/2015 in [facility] Cardiac and Pulmonary Rehab   Date  05/13/15   Educator  LB   Instruction Review Code  2- meets  goals/outcomes      General Nutrition Guidelines/Fats and Fiber: -Group instruction provided by verbal, written material, models and posters to present the general guidelines for heart healthy nutrition. Gives an explanation and review of dietary fats and fiber.      Pulmonary Rehab from 07/25/2015 in [facility] Cardiac and Pulmonary Rehab   Date  06/30/15   Educator  CR   Instruction Review Code  2- meets goals/outcomes      Controlling Sodium/Reading Food Labels: -Group verbal and written material supporting the discussion of sodium use in heart healthy nutrition. Review and explanation with models, verbal and written materials for utilization of the food label.   Exercise Physiology & Risk Factors: - Group verbal and written instruction with models to review the exercise physiology of the cardiovascular system and associated critical values. Details cardiovascular disease risk factors and the goals associated with each risk factor.      Pulmonary Rehab from 07/25/2015 in [facility] Cardiac and Pulmonary Rehab   Date  05/13/15   Educator  LB2      Aerobic Exercise & Resistance Training: - Gives group verbal and written discussion on the health impact of inactivity. On the components of aerobic and resistive training programs and the benefits of this training and  how to safely progress through these programs.   Flexibility, Balance, General Exercise Guidelines: - Provides group verbal and written instruction on the benefits of flexibility and balance training programs. Provides general exercise guidelines with specific guidelines to those with heart or lung disease. Demonstration and skill practice provided.      Pulmonary Rehab from 07/25/2015 in [facility] Cardiac and Pulmonary Rehab   Date  06/04/15   Educator  BS   Instruction Review Code  2- meets goals/outcomes      Stress Management: - Provides group verbal and written instruction about the health risks of elevated stress, cause of high  stress, and healthy ways to reduce stress.      Pulmonary Rehab from 07/25/2015 in [facility] Cardiac and Pulmonary Rehab   Date  06/11/15   Educator  [facility]   Instruction Review Code  2- meets goals/outcomes      Depression: - Provides group verbal and written instruction on the correlation between heart/lung disease and depressed mood, treatment options, and the stigmas associated with seeking treatment.      Pulmonary Rehab from 07/25/2015 in [facility] Cardiac and Pulmonary Rehab   Date  07/09/15   Educator  [facility]   Instruction Review Code  2- meets goals/outcomes      Exercise & Equipment Safety: - Individual verbal instruction and demonstration of equipment use and safety with use of the equipment.   Infection Prevention: - Provides verbal and written material to individual with discussion of infection control including proper hand washing and proper equipment cleaning during exercise session.   Falls Prevention: - Provides verbal and written material to individual with discussion of falls prevention and safety.      Pulmonary Rehab from 07/25/2015 in [facility] Cardiac and Pulmonary Rehab   Date  05/13/15   Educator  LB   Instruction Review Code  2- meets goals/outcomes      Diabetes: - Individual verbal and written instruction to review signs/symptoms of diabetes, desired ranges of glucose level fasting, after meals and with exercise. Advice that pre and post exercise glucose checks will be done for 3 sessions at entry of program.   Chronic Lung Diseases: - Group verbal and written instruction to review new updates, new respiratory medications, new advancements in procedures and treatments. Provide informative websites and "800" numbers of self-education.      Pulmonary Rehab from 07/25/2015 in [facility] Cardiac and Pulmonary Rehab   Date  06/02/15   Educator  L. Owens Shark, RT   Instruction Review Code  2- meets goals/outcomes      Lung Procedures: - Group verbal and written instruction to describe  testing methods done to diagnose lung disease. Review the outcome of test results. Describe the treatment choices: Pulmonary Function Tests, ABGs and oximetry.   Energy Conservation: - Provide group verbal and written instruction for methods to conserve energy, plan and organize activities. Instruct on pacing techniques, use of adaptive equipment and posture/positioning to relieve shortness of breath.   Triggers: - Group verbal and written instruction to review types of environmental controls: home humidity, furnaces, filters, dust mite/pet prevention, HEPA vacuums. To discuss weather changes, air quality and the benefits of nasal washing.      Pulmonary Rehab from 07/25/2015 in [facility] Cardiac and Pulmonary Rehab   Date  05/19/15   Educator  LB   Instruction Review Code  2- meets goals/outcomes      Exacerbations: - Group verbal and written instruction to provide: warning signs, infection symptoms, calling MD promptly, preventive  modes, and value of vaccinations. Review: effective airway clearance, coughing and/or vibration techniques. Create an Sports administrator.      Pulmonary Rehab from 07/25/2015 in [facility] Cardiac and Pulmonary Rehab   Date  07/21/15   Educator  LB   Instruction Review Code  2- meets goals/outcomes      Oxygen: - Individual and group verbal and written instruction on oxygen therapy. Includes supplement oxygen, available portable oxygen systems, continuous and intermittent flow rates, oxygen safety, concentrators, and Medicare reimbursement for oxygen.   Respiratory Medications: - Group verbal and written instruction to review medications for lung disease. Drug class, frequency, complications, importance of spacers, rinsing mouth after steroid MDI's, and proper cleaning methods for nebulizers.   AED/CPR: - Group verbal and written instruction with the use of models to demonstrate the basic use of the AED with the basic ABC's of resuscitation.      Pulmonary Rehab from  07/25/2015 in [facility] Cardiac and Pulmonary Rehab   Date  06/13/15   Educator  CE   Instruction Review Code  2- meets goals/outcomes      Breathing Retraining: - Provides individuals verbal and written instruction on purpose, frequency, and proper technique of diaphragmatic breathing and pursed-lipped breathing. Applies individual practice skills.      Pulmonary Rehab from 07/25/2015 in [facility] Cardiac and Pulmonary Rehab   Date  05/13/15   Educator  LB   Instruction Review Code  2- meets goals/outcomes      Anatomy and Physiology of the Lungs: - Group verbal and written instruction with the use of models to provide basic lung anatomy and physiology related to function, structure and complications of lung disease.   Heart Failure: - Group verbal and written instruction on the basics of heart failure: signs/symptoms, treatments, explanation of ejection fraction, enlarged heart and cardiomyopathy.      Pulmonary Rehab from 07/25/2015 in [facility] Cardiac and Pulmonary Rehab   Date  07/25/15 [facility] your Numbers]   Educator  C.[email or web address]   Instruction Review Code  2- meets goals/outcomes      Sleep Apnea: - Individual verbal and written instruction to review Obstructive Sleep Apnea. Review of risk factors, methods for diagnosing and types of masks and machines for OSA.   Anxiety: - Provides group, verbal and written instruction on the correlation between heart/lung disease and anxiety, treatment options, and management of anxiety.   Relaxation: - Provides group, verbal and written instruction about the benefits of relaxation for patients with heart/lung disease. Also provides patients with examples of relaxation techniques.      Pulmonary Rehab from 07/25/2015 in [facility] Cardiac and Pulmonary Rehab   Date  06/25/15   Educator  Kathreen Cornfield, [facility]   Instruction Review Code  2- Meets goals/outcomes      Knowledge Questionnaire Score:     Knowledge Questionnaire Score - 05/13/15 0905    Knowledge  Questionnaire Score   Pre Score 6/10       Core Components/Risk Factors/Patient Goals at Admission:     Personal Goals and Risk Factors at Admission - 05/13/15 0905    Core Components/Risk Factors/Patient Goals on Admission    Weight Management Yes;Weight Loss   Intervention Weight Management: Develop a combined nutrition and exercise program designed to reach desired caloric intake, while maintaining appropriate intake of nutrient and fiber, sodium and fats, and appropriate energy expenditure required for the weight goal.;Weight Management: Provide education and appropriate resources to help participant work on and attain dietary goals.;Weight Management/Obesity: Establish reasonable short  term and long term weight goals.;Obesity: Provide education and appropriate resources to help participant work on and attain dietary goals.   Admit Weight 189 lb (85.73 kg)   Goal Weight: Short Term 184 lb (83.462 kg)   Goal Weight: Long Term 159 lb (72.122 kg)   Expected Outcomes Short Term: Continue to assess and modify interventions until short term weight is achieved;Long Term: Adherence to nutrition and physical activity/exercise program aimed toward attainment of established weight goal;Weight Maintenance: Understanding of the daily nutrition guidelines, which includes 25-35% calories from fat, 7% or less cal from saturated fats, less than 210m cholesterol, less than 1.5gm of sodium, & 5 or more servings of fruits and vegetables daily;Weight Loss: Understanding of general recommendations for a balanced deficit meal plan, which promotes 1-2 lb weight loss per week and includes a negative energy balance of [phone] kcal/d;Understanding recommendations for meals to include 15-35% energy as protein, 25-35% energy from fat, 35-60% energy from carbohydrates, less than 2027mof dietary cholesterol, 20-35 gm of total fiber daily;Understanding of distribution of calorie intake throughout the day with the consumption  of 4-5 meals/snacks;Weight Gain: Understanding of general recommendations for a high calorie, high protein meal plan that promotes weight gain by distributing calorie intake throughout the day with the consumption for 4-5 meals, snacks, and/or supplements   Sedentary Yes   Intervention Provide advice, education, support and counseling about physical activity/exercise needs.;Develop an individualized exercise prescription for aerobic and resistive training based on initial evaluation findings, risk stratification, comorbidities and participant's personal goals.   Expected Outcomes Achievement of increased cardiorespiratory fitness and enhanced flexibility, muscular endurance and strength shown through measurements of functional capacity and personal statement of participant.   Increase Strength and Stamina Yes   Intervention Provide advice, education, support and counseling about physical activity/exercise needs.;Develop an individualized exercise prescription for aerobic and resistive training based on initial evaluation findings, risk stratification, comorbidities and participant's personal goals.   Expected Outcomes Achievement of increased cardiorespiratory fitness and enhanced flexibility, muscular endurance and strength shown through measurements of functional capacity and personal statement of participant.   Improve shortness of breath with ADL's Yes   Intervention Provide education, individualized exercise plan and daily activity instruction to help decrease symptoms of SOB with activities of daily living.   Expected Outcomes Short Term: Achieves a reduction of symptoms when performing activities of daily living.   Develop more efficient breathing techniques such as purse lipped breathing and diaphragmatic breathing; and practicing self-pacing with activity Yes   Intervention Provide education, demonstration and support about specific breathing techniuqes utilized for more efficient breathing.  Include techniques such as pursed lipped breathing, diaphragmatic breathing and self-pacing activity.   Expected Outcomes Short Term: Participant will be able to demonstrate and use breathing techniques as needed throughout daily activities.   Increase knowledge of respiratory medications and ability to use respiratory devices properly  Yes   Intervention Provide education and demonstration as needed of appropriate use of medications, inhalers, and oxygen therapy.  Ms PhGrossoas Symbicort Respimat as needed and rarely uses; CPAP with AdFort [place]full face mask+   Expected Outcomes Short Term: Achieves understanding of medications use. Understands that oxygen is a medication prescribed by physician. Demonstrates appropriate use of inhaler and oxygen therapy.      Core Components/Risk Factors/Patient Goals Review:      Goals and Risk Factor Review      05/19/15 1544 05/28/15 1000 06/02/15 1000 06/11/15 1525 06/13/15 1618   Core Components/Risk Factors/Patient  Goals Review   Personal Goals Review Develop more efficient breathing techniques such as purse lipped breathing and diaphragmatic breathing and practicing self-pacing with activity. Sedentary Increase knowledge of respiratory medications and ability to use respiratory devices properly.;Improve shortness of breath with ADL's;Develop more efficient breathing techniques such as purse lipped breathing and diaphragmatic breathing and practicing self-pacing with activity.;Increase Strength and Stamina;Sedentary Develop more efficient breathing techniques such as purse lipped breathing and diaphragmatic breathing and practicing self-pacing with activity. Develop more efficient breathing techniques such as purse lipped breathing and diaphragmatic breathing and practicing self-pacing with activity.   Review Ms Willmott used PLB breathing with her exercise goals today - good technique. Ms Hamada plans to increase her exercise by walking at [facility] walking track. She plans to walk on Tuesdays and Thursdays one mile. Ms Kapral exercised at the [employer] on her off days from [employer]. She is determined to work on weight loss and increase her stamina and be less sedentary. Walking the track at [place] hurt her back, so she did use the treadmill and NuStep. we discussed her MDI's.. Ms Hereford rarely uses her Combivent. She deals with her shortness of breath by pacing and PLB. She states that she has improved her breath and notices this mainly with her housework. Ms Beane is complaining of more shortness of breath. Her phusician ordered an ECHO test for next week. We discussed the importance of the test to look at her pulmonary arterial pressure which could be affecting her breathing. She does not have much awareness of PAH. Reviewed PLB technique with Ms. Hardin Negus.  She has some issues with her nose and says that it is hard to breath through her nose.  I taught her that the expiratory phase should be much longer that her inspiratory phase and if she needed to breath through her mouth that it was alright.   Expected Outcomes Continue using PLB breathing without cueing with her exercise and home activites. Increase her exercise to 5 days/week.  Educate Ms Haliburton on [facility]. PLB will decrease her SOB while exercising.     06/13/15 1621 06/18/15 1446 07/09/15 1359 07/14/15 1630 07/18/15 1238   Core Components/Risk Factors/Patient Goals Review   Personal Goals Review Increase knowledge of respiratory medications and ability to use respiratory devices properly. Increase knowledge of respiratory medications and ability to use respiratory devices properly.;Improve shortness of breath with ADL's Sedentary Weight Management/Obesity Develop more efficient breathing techniques such as purse lipped breathing and diaphragmatic breathing and practicing self-pacing with activity.   Review Reviewed Ms. Todt respiratory medications with her. She  takes Combivent PRN and wears CPAP at night. Ms Vasques complained of congestion and shortness of breath when she arrived in [place]. She had mild scattered wheezes. Educated her on the importance of using her Combivent during episodes like this rather than taking it every day. Also educated her on the importance of calling her physician today and have an order for an antibiotic to preent an exacerbation. Jakelin has added exercise at [facility] on the days she does not come to class. Discussed weight management with Ms Dexter - she is reducing her sweets and salt. Recommended "No Salt" to her. She is not on potassium which would be a concern with htis salt. I reviewed PLB with Ms. Hardin Negus.  She demonstrates proper technique and knows when to use it.   Expected Outcomes She rarely takes her inhaler and is very compliant with her CPAP.  Ms. Jemmott seems  to have a good understanding of her respiratory medications and equipment. Use Combivent properly and react quickly to a change in her lung condition - call her physician immediately for therapy order. Brihanna will see continued improvement in overall fitness level. Continue encouraging Ms Gren to loss weight and continue with her exercise at [facility] on her days off from [place]. Using PLB will help decrease her SOB during exericse and ADLs.  This will help her preform better in both areas of life.      08/01/15 1000           Core Components/Risk Factors/Patient Goals Review   Review Ms Mehler plans to continue her exercise at [facility] gym. Her husband exercises there, so she can attend with him. If this does not work out, she will consider FF 2 days/week. She rarely uses her Albuterol MDI which is as needed, knows with an exacerbation, it can be helpful therapy. Ms Senk does PLB with her shortness of breath, especially on the treadmill.          Core Components/Risk Factors/Patient Goals at Discharge (Final Review):      Goals  and Risk Factor Review - 08/01/15 1000    Core Components/Risk Factors/Patient Goals Review   Review Ms Fanguy plans to continue her exercise at [facility] gym. Her husband exercises there, so she can attend with him. If this does not work out, she will consider FF 2 days/week. She rarely uses her Albuterol MDI which is as needed, knows with an exacerbation, it can be helpful therapy. Ms Oelke does PLB with her shortness of breath, especially on the treadmill.      ITP Comments:     ITP Comments      05/19/15 1000 05/26/15 1000         ITP Comments Ms Colvin completed her first day of exercise in [facility] and accomplished her exercise goals with acceptable vital signs and RPE/PD scales. She completed her weight goals and stayed for education class today. Ms Bradby is concerned about her results from a P125 lab test. An ultrasound was performed, and she is waiting for results.         Comments: 30 Day Review

## 2015-08-04 NOTE — Progress Notes (Signed)
Daily Session Note  Patient Details  Name: Nicole Arnold MRN: [id number] Date of Birth: [birth year] Referring Provider:        Pulmonary Rehab from 07/02/2015 in [facility] Cardiac and Pulmonary Rehab   Referring Provider  Simonne Maffucci MD      Encounter Date: 08/04/2015  Check In:     Session Check In - 08/04/15 1104    Check-In   Location [facility]-Cardiac & Pulmonary Rehab   Staff Present Carson Myrtle, BS, RRT, Respiratory Therapist;Dior Stepter Amedeo Plenty, BS, ACSM CEP, Exercise Physiologist;Jessica Luan Pulling, MA, ACSM RCEP, Exercise Physiologist   Supervising physician immediately available to respond to emergencies [facility] immediately available ER MD   Physician(s) Darl Householder and Corky Downs   Medication changes reported     No   Fall or balance concerns reported    No   Warm-up and Cool-down Performed on first and last piece of equipment   Resistance Training Performed Yes   VAD Patient? No   Pain Assessment   Currently in Pain? No/denies   Multiple Pain Sites No         Goals Met:  Proper associated with RPD/PD & O2 Sat Independence with exercise equipment Exercise tolerated well Strength training completed today  Goals Unmet:  Not Applicable  Comments: Patient completed exercise prescription and all exercise goals during rehab session. The exercise was tolerated well and the patient is progressing in the program.     Dr. Emily Filbert is Medical Director for [place] and [facility].

## 2015-08-06 ENCOUNTER — Encounter: Payer: Medicare Other | Admitting: *Deleted

## 2015-08-06 DIAGNOSIS — G4730 Sleep apnea, unspecified: Secondary | ICD-10-CM

## 2015-08-06 DIAGNOSIS — I2721 Secondary pulmonary arterial hypertension: Secondary | ICD-10-CM

## 2015-08-06 NOTE — Progress Notes (Signed)
Daily Session Note  Patient Details  Name: Nicole Arnold MRN: [id number] Date of Birth: 06/09/1928 Referring Provider:        Pulmonary Rehab from 07/02/2015 in [facility] Cardiac and Pulmonary Rehab   Referring Provider  McQuaid, Douglas MD      Encounter Date: 08/06/2015  Check In:     Session Check In - 08/06/15 1150    Check-In   Location [facility]-Cardiac & Pulmonary Rehab   Staff Present Jessica Hawkins, MA, ACSM RCEP, Exercise Physiologist;Amanda Sommer, BA, ACSM CEP, Exercise Physiologist;Laureen Brown, BS, RRT, Respiratory Therapist   Supervising physician immediately available to respond to emergencies [facility] immediately available ER MD   Physician(s) Gayle and Williams   Medication changes reported     No   Fall or balance concerns reported    No   Warm-up and Cool-down Performed on first and last piece of equipment   Resistance Training Performed Yes   VAD Patient? No   Pain Assessment   Currently in Pain? No/denies   Multiple Pain Sites No         Goals Met:  Proper associated with RPD/PD & O2 Sat Independence with exercise equipment Using PLB without cueing & demonstrates good technique Exercise tolerated well Strength training completed today  Goals Unmet:  Not Applicable  Comments: Pt able to follow exercise prescription today without complaint.  Will continue to monitor for progression.    Dr. Mark Miller is Medical Director for [facility] and [facility]. 

## 2015-08-08 ENCOUNTER — Encounter: Payer: Medicare Other | Admitting: *Deleted

## 2015-08-08 VITALS — Ht 63.0 in | Wt 192.1 lb

## 2015-08-08 DIAGNOSIS — G4730 Sleep apnea, unspecified: Secondary | ICD-10-CM | POA: Diagnosis not present

## 2015-08-08 DIAGNOSIS — I2721 Secondary pulmonary arterial hypertension: Secondary | ICD-10-CM

## 2015-08-08 NOTE — Patient Instructions (Signed)
Discharge Instructions  Patient Details  Name: Nicole Arnold MRN: [id number] Date of Birth: March 13, 1928 Referring Provider:  Juanito Doom, MD   Number of Visits: 30  Reason for Discharge:  Patient reached a stable level of exercise. Patient independent in their exercise.  Smoking History:  History  Smoking status  . Former Smoker -- 0.50 packs/day for 30 years  . Types: Cigarettes  . Quit date: 01/25/1993  Smokeless tobacco  . Never Used    Diagnosis:  No diagnosis found.  Initial Exercise Prescription:     Initial Exercise Prescription - 07/02/15 1400    Date of Initial Exercise RX and Referring Provider   Referring Provider Simonne Maffucci MD      Discharge Exercise Prescription (Final Exercise Prescription Changes):     Exercise Prescription Changes - 07/30/15 1400    Exercise Review   Progression Yes   Response to Exercise   Blood Pressure (Admit) 120/68 mmHg   Blood Pressure (Exercise) 142/70 mmHg   Blood Pressure (Exit) 120/60 mmHg   Heart Rate (Admit) 67 bpm   Heart Rate (Exercise) 90 bpm   Heart Rate (Exit) 71 bpm   Oxygen Saturation (Admit) 98 %   Oxygen Saturation (Exercise) 95 %   Oxygen Saturation (Exit) 95 %   Rating of Perceived Exertion (Exercise) 13   Perceived Dyspnea (Exercise) 2   Symptoms None   Comments Home Exercise Given 06/30/15   Duration Progress to 45 minutes of aerobic exercise without signs/symptoms of physical distress   Intensity THRR unchanged   Progression   Progression Continue to progress workloads to maintain intensity without signs/symptoms of physical distress.   Average METs 2.9   Resistance Training   Training Prescription Yes   Weight 3   Reps 10-12   Interval Training   Interval Training No   Treadmill   MPH 2   Grade 0   Minutes 15   METs 2.53   Recumbant Bike   Level 4   RPM 60   Minutes 15   METs 3   NuStep   Level 4   Watts 55   Minutes 15   METs 2.9   Home Exercise Plan   Plans to  continue exercise at [employer] (comment)  [employer]   Frequency Add 3 additional days to program exercise sessions.      Functional Capacity:     6 Minute Walk      05/13/15 1024 07/09/15 1213 08/08/15 1040   6 Minute Walk   Phase Initial Mid Program Discharge   Distance 1175 feet 1150 feet 1200 feet   Distance % Change   2 %   Walk Time 6 minutes 6 minutes 6 minutes   # of Rest Breaks  0 0   MPH  2.18 2.27   METS  2.26 2.76   RPE '17 13 15   '$ Perceived Dyspnea  '4 4 4   '$ VO2 Peak  7.9 2.56   Symptoms  No    Resting HR 64 bpm 68 bpm 68 bpm   Resting BP 134/70 mmHg 120/64 mmHg 142/78 mmHg   Max Ex. HR 115 bpm 126 bpm 118 bpm   Max Ex. BP 178/70 mmHg 188/74 mmHg 260/96 mmHg   2 Minute Post BP  150/64 mmHg 124/70 mmHg   Interval HR   Baseline HR  68    1 Minute HR  94    2 Minute HR  104    3 Minute HR  111    4 Minute HR  110    5 Minute HR  111    6 Minute HR  126    2 Minute Post HR  87    Interval Heart Rate?  Yes    Interval Oxygen   Interval Oxygen?  Yes    Baseline Oxygen Saturation %  98 % 98 %   Baseline Liters of Oxygen   0 L  Room Air   1 Minute Oxygen Saturation %  92 %    2 Minute Oxygen Saturation %  92 %    3 Minute Oxygen Saturation %  91 %    4 Minute Oxygen Saturation %  91 %    5 Minute Oxygen Saturation %  91 %    6 Minute Oxygen Saturation %  89 % 87 %   6 Minute Liters of Oxygen   0 L   2 Minute Post Oxygen Saturation %  97 %       Quality of Life:     Quality of Life - 05/13/15 0905    Quality of Life Scores   Health/Function Pre 25.2 %   Socioeconomic Pre 30 %   Psych/Spiritual Pre 30 %   Family Pre 28.8 %   GLOBAL Pre 27.71 %      Personal Goals: Goals established at orientation with interventions provided to work toward goal.     Personal Goals and Risk Factors at Admission - 05/13/15 0905    Core Components/Risk Factors/Patient Goals on Admission    Weight Management Yes;Weight Loss   Intervention Weight  Management: Develop a combined nutrition and exercise program designed to reach desired caloric intake, while maintaining appropriate intake of nutrient and fiber, sodium and fats, and appropriate energy expenditure required for the weight goal.;Weight Management: Provide education and appropriate resources to help participant work on and attain dietary goals.;Weight Management/Obesity: Establish reasonable short term and long term weight goals.;Obesity: Provide education and appropriate resources to help participant work on and attain dietary goals.   Admit Weight 189 lb (85.73 kg)   Goal Weight: Short Term 184 lb (83.462 kg)   Goal Weight: Long Term 159 lb (72.122 kg)   Expected Outcomes Short Term: Continue to assess and modify interventions until short term weight is achieved;Long Term: Adherence to nutrition and physical activity/exercise program aimed toward attainment of established weight goal;Weight Maintenance: Understanding of the daily nutrition guidelines, which includes 25-35% calories from fat, 7% or less cal from saturated fats, less than '200mg'$  cholesterol, less than 1.5gm of sodium, & 5 or more servings of fruits and vegetables daily;Weight Loss: Understanding of general recommendations for a balanced deficit meal plan, which promotes 1-2 lb weight loss per week and includes a negative energy balance of [phone]/d;Understanding recommendations for meals to include 15-35% energy as protein, 25-35% energy from fat, 35-60% energy from carbohydrates, less than '200mg'$  of dietary cholesterol, 20-35 gm of total fiber daily;Understanding of distribution of calorie intake throughout the day with the consumption of 4-5 meals/snacks;Weight Gain: Understanding of general recommendations for a high calorie, high protein meal plan that promotes weight gain by distributing calorie intake throughout the day with the consumption for 4-5 meals, snacks, and/or supplements   Sedentary Yes   Intervention  Provide advice, education, support and counseling about physical activity/exercise needs.;Develop an individualized exercise prescription for aerobic and resistive training based on initial evaluation findings, risk stratification, comorbidities and participant's personal goals.   Expected Outcomes Achievement of increased cardiorespiratory  fitness and enhanced flexibility, muscular endurance and strength shown through measurements of functional capacity and personal statement of participant.   Increase Strength and Stamina Yes   Intervention Provide advice, education, support and counseling about physical activity/exercise needs.;Develop an individualized exercise prescription for aerobic and resistive training based on initial evaluation findings, risk stratification, comorbidities and participant's personal goals.   Expected Outcomes Achievement of increased cardiorespiratory fitness and enhanced flexibility, muscular endurance and strength shown through measurements of functional capacity and personal statement of participant.   Improve shortness of breath with ADL's Yes   Intervention Provide education, individualized exercise plan and daily activity instruction to help decrease symptoms of SOB with activities of daily living.   Expected Outcomes Short Term: Achieves a reduction of symptoms when performing activities of daily living.   Develop more efficient breathing techniques such as purse lipped breathing and diaphragmatic breathing; and practicing self-pacing with activity Yes   Intervention Provide education, demonstration and support about specific breathing techniuqes utilized for more efficient breathing. Include techniques such as pursed lipped breathing, diaphragmatic breathing and self-pacing activity.   Expected Outcomes Short Term: Participant will be able to demonstrate and use breathing techniques as needed throughout daily activities.   Increase knowledge of respiratory medications  and ability to use respiratory devices properly  Yes   Intervention Provide education and demonstration as needed of appropriate use of medications, inhalers, and oxygen therapy.  Ms Offner has Symbicort Respimat as needed and rarely uses; CPAP with [place] -full face mask+   Expected Outcomes Short Term: Achieves understanding of medications use. Understands that oxygen is a medication prescribed by physician. Demonstrates appropriate use of inhaler and oxygen therapy.       Personal Goals Discharge:     Goals and Risk Factor Review - 08/01/15 1000    Core Components/Risk Factors/Patient Goals Review   Review Ms Arca plans to continue her exercise at [facility] gym. Her husband exercises there, so she can attend with him. If this does not work out, she will consider FF 2 days/week. She rarely uses her Albuterol MDI which is as needed, knows with an exacerbation, it can be helpful therapy. Ms Alling does PLB with her shortness of breath, especially on the treadmill.      Nutrition & Weight - Outcomes:     Pre Biometrics - 05/13/15 1031    Pre Biometrics   Height '5\' 3"'$  (1.6 m)   Weight 188 lb 12.8 oz (85.639 kg)   Waist Circumference 38 inches   Hip Circumference 46 inches   Waist to Hip Ratio 0.83 %   BMI (Calculated) 33.5         Post Biometrics - 08/08/15 1044     Post  Biometrics   Height '5\' 3"'$  (1.6 m)   Weight 192 lb 1.6 oz (87.136 kg)   Waist Circumference 36.5 inches   Hip Circumference 47 inches   Waist to Hip Ratio 0.78 %   BMI (Calculated) 34.1      Nutrition:     Nutrition Therapy & Goals - 05/13/15 0905    Nutrition Therapy   Diet Prefers not to meet with the dietitian; Ms Engdahl cooks healthy, she does like sweets; low salt and less meats.      Nutrition Discharge:   Education Questionnaire Score:     Knowledge Questionnaire Score - 05/13/15 0905    Knowledge Questionnaire Score   Pre Score 6/10      Goals reviewed with  patient;  copy given to patient.

## 2015-08-08 NOTE — Addendum Note (Signed)
Addended by: Braulio Bosch on: 08/08/2015 12:55 PM   Modules accepted: Orders

## 2015-08-08 NOTE — Progress Notes (Signed)
Daily Session Note  Patient Details  Name: Nicole Arnold MRN: [id number] Date of Birth: Oct 27, 1928 Referring Provider:        Pulmonary Rehab from 07/02/2015 in [facility]   Referring Provider  Simonne Maffucci MD      Encounter Date: 08/08/2015  Check In:     Session Check In - 08/08/15 1052    Check-In   Location [facility]-Cardiac & Pulmonary Rehab   Staff Present Nyoka Cowden, RN, BSN, Robley Fries, RN, BSN;Stacey Blanch Media, RRT, RCP, Respiratory Therapist   Supervising physician immediately available to respond to emergencies [facility] immediately available ER MD   Physician(s) Joni Fears and Corky Downs   Medication changes reported     No   Fall or balance concerns reported    No   Warm-up and Cool-down Performed on first and last piece of equipment   Resistance Training Performed Yes   VAD Patient? No   Pain Assessment   Currently in Pain? No/denies   Multiple Pain Sites No         Goals Met:  Proper associated with RPD/PD & O2 Sat Independence with exercise equipment Using PLB without cueing & demonstrates good technique Exercise tolerated well Personal goals reviewed Strength training completed today  Goals Unmet:  Not Applicable  Comments:  Nicole Arnold graduated today from pulmonary rehab with 30 sessions completed.  Details of the patient's exercise prescription and what She needs to do in order to continue the prescription and progress.  Patient verbalized understanding.  [name] plans to continue to exercise by going to [employer].    Dr. Emily Filbert is Medical Director for [place] and [facility].

## 2015-08-08 NOTE — Progress Notes (Signed)
Pulmonary Individual Treatment Plan  Patient Details  Name: Nicole Arnold MRN: [id number] Date of Birth: 1928/06/19 Referring Provider:  Simonne Maffucci, MD      Pulmonary Rehab from 07/02/2015 in [facility] Cardiac and Pulmonary Rehab   Referring Provider  Simonne Maffucci MD      Initial Encounter Date: 05/13/2015      Pulmonary Rehab from 07/02/2015 in [facility] Cardiac and Pulmonary Rehab   Referring Provider  Simonne Maffucci MD      Visit Diagnosis: Pulmonary arterial hypertension [facility])  Sleep apnea  Patient's Home Medications on Admission:  Current outpatient prescriptions:  .  ALPRAZolam (XANAX) 0.5 MG tablet, Take 0.5 mg by mouth 3 (three) times daily as needed for anxiety., Disp: , Rfl:  .  amLODipine (NORVASC) 10 MG tablet, Take 10 mg by mouth daily., Disp: , Rfl:  .  aspirin 81 MG tablet, Take 81 mg by mouth daily., Disp: , Rfl:  .  Calcium-Vitamin D (CALTRATE 600 PLUS-VIT D PO), Take 1 tablet by mouth 2 (two) times daily., Disp: , Rfl:  .  Ipratropium-Albuterol (COMBIVENT RESPIMAT) 20-100 MCG/ACT AERS respimat, Inhale 1 puff into the lungs every 6 (six) hours as needed for wheezing or shortness of breath. (Patient not taking: Reported on 07/07/2015), Disp: 3 Inhaler, Rfl: 3 .  lansoprazole (PREVACID) 30 MG capsule, Take 30 mg by mouth daily at 12 noon., Disp: , Rfl:  .  levothyroxine (SYNTHROID, LEVOTHROID) 50 MCG tablet, Take 50 mcg by mouth daily before breakfast., Disp: , Rfl:  .  Multiple Vitamins-Minerals (CENTRUM SILVER ADULT 50+ PO), Take 1 tablet by mouth daily., Disp: , Rfl:  .  PARoxetine (PAXIL) 20 MG tablet, Take 20 mg by mouth daily., Disp: , Rfl:  .  raloxifene (EVISTA) 60 MG tablet, Take 60 mg by mouth daily., Disp: , Rfl:  .  vitamin E 400 UNIT capsule, Take 400 Units by mouth daily., Disp: , Rfl:   Past Medical History: Past Medical History  Diagnosis Date  . Pancreatitis   . Cataract   . Chronic constipation   . GERD (gastroesophageal reflux disease)   .  Osteopenia     Tobacco Use: History  Smoking status  . Former Smoker -- 0.50 packs/day for 30 years  . Types: Cigarettes  . Quit date: 01/25/1993  Smokeless tobacco  . Never Used    Labs: Recent Review Flowsheet Data    There is no flowsheet data to display.       ADL UCSD:     Pulmonary Assessment Scores      05/13/15 0905 07/09/15 1000 08/08/15 1212   ADL UCSD   ADL Phase Entry Mid Exit   SOB Score total 48 55 55   Rest 0 0 0   Walk _0 Stairs _1 Bath _2 Dress _3 Shop _4 Pulmonary Function Assessment:     Pulmonary Function Assessment - 05/13/15 0905    Pulmonary Function Tests   RV% 49 %   DLCO% 50 %   Initial Spirometry Results   FVC% 119 %   FEV1% 88 %   FEV1/FVC Ratio 50   Comments Test date 07/31/2013   Post Bronchodilator Spirometry Results   FVC% 115 %   FEV1% 96 %   FEV1/FVC Ratio 56   Breath   Bilateral Breath Sounds Clear   Shortness of Breath Yes;Fear of Shortness of Breath;Limiting  activity      Exercise Target Goals:    Exercise Program Goal: Individual exercise prescription set with THRR, safety & activity barriers. Participant demonstrates ability to understand and report RPE using BORG scale, to self-measure pulse accurately, and to acknowledge the importance of the exercise prescription.  Exercise Prescription Goal: Starting with aerobic activity 30 plus minutes a day, 3 days per week for initial exercise prescription. Provide home exercise prescription and guidelines that participant acknowledges understanding prior to discharge.  Activity Barriers & Risk Stratification:     Activity Barriers & Cardiac Risk Stratification - 05/13/15 0905    Activity Barriers & Cardiac Risk Stratification   Activity Barriers Shortness of Breath;Deconditioning;Muscular Weakness   Cardiac Risk Stratification Moderate      6 Minute Walk:     6 Minute Walk      05/13/15 1024 07/09/15 1213 08/08/15 1040   6  Minute Walk   Phase Initial Mid Program Discharge   Distance 1175 feet 1150 feet 1200 feet   Distance % Change   2 %   Walk Time 6 minutes 6 minutes 6 minutes   # of Rest Breaks  0 0   MPH  2.18 2.27   METS  2.26 2.76   RPE _0 Perceived Dyspnea  _1 VO2 Peak  7.9 2.56   Symptoms  No    Resting HR 64 bpm 68 bpm 68 bpm   Resting BP 134/70 mmHg 120/64 mmHg 142/78 mmHg   Max Ex. HR 115 bpm 126 bpm 118 bpm   Max Ex. BP 178/70 mmHg 188/74 mmHg 260/96 mmHg   2 Minute Post BP  150/64 mmHg 124/70 mmHg   Interval HR   Baseline HR  68    1 Minute HR  94    2 Minute HR  104    3 Minute HR  111    4 Minute HR  110    5 Minute HR  111    6 Minute HR  126    2 Minute Post HR  87    Interval Heart Rate?  Yes    Interval Oxygen   Interval Oxygen?  Yes    Baseline Oxygen Saturation %  98 % 98 %   Baseline Liters of Oxygen   0 L  Room Air   1 Minute Oxygen Saturation %  92 %    2 Minute Oxygen Saturation %  92 %    3 Minute Oxygen Saturation %  91 %    4 Minute Oxygen Saturation %  91 %    5 Minute Oxygen Saturation %  91 %    6 Minute Oxygen Saturation %  89 % 87 %   6 Minute Liters of Oxygen   0 L   2 Minute Post Oxygen Saturation %  97 %       Initial Exercise Prescription:     Initial Exercise Prescription - 07/02/15 1400    Date of Initial Exercise RX and Referring Provider   Referring Provider Simonne Maffucci MD      Perform Capillary Blood Glucose checks as needed.  Exercise Prescription Changes:     Exercise Prescription Changes      05/19/15 1500 05/26/15 1200 05/27/15 0800 05/28/15 1500 06/16/15 1100   Exercise Review   Progression  Yes Yes  Yes   Response to Exercise   Blood Pressure (Admit) 122/62 mmHg  120/68 mmHg  Data from 05/26/2015  Blood Pressure (Exercise)   174/82 mmHg     Blood Pressure (Exit) 120/66 mmHg  116/64 mmHg     Heart Rate (Admit) 65 bpm  64 bpm     Heart Rate (Exercise) 91 bpm  103 bpm     Heart Rate (Exit) 74 bpm  73 bpm      Oxygen Saturation (Admit) 98 %  99 %     Oxygen Saturation (Exercise) 91 %  93 %     Oxygen Saturation (Exit) 98 %  95 %     Rating of Perceived Exertion (Exercise) 11  13     Perceived Dyspnea (Exercise) 3  4     Symptoms   none     Comments   Ms Dettmann did not like the XR -too much stress on her back, so we changed to the BX.     Progression   Progression   Continue to progress workloads to maintain intensity without signs/symptoms of physical distress.     Resistance Training   Training Prescription Yes Yes Yes     Weight _0 Reps 10-12 10-12 10-12     Treadmill   MPH 1.8 1.8 2     Grade 0 0 0     Minutes _1 Recumbant Bike   Level  2 2     RPM  50 50     Minutes  15 15     NuStep   Level _2 Watts 40 40 40     Minutes _3 REL-XR   Level 2 --      Watts 50 --      Minutes 15 --      Biostep-RELP   Level    2 2   Watts    50 50   Minutes    15 15     06/18/15 1100 07/01/15 1400 07/11/15 1200 07/15/15 1300 07/30/15 1400   Exercise Review   Progression _4    Response to Exercise   Blood Pressure (Admit)  148/82 mmHg  126/64 mmHg 120/68 mmHg   Blood Pressure (Exercise)  142/80 mmHg  146/76 mmHg 142/70 mmHg   Blood Pressure (Exit)  132/74 mmHg  120/60 mmHg 120/60 mmHg   Heart Rate (Admit)  71 bpm  65 bpm 67 bpm   Heart Rate (Exercise)  104 bpm  100 bpm 90 bpm   Heart Rate (Exit)  74 bpm  70 bpm 71 bpm   Oxygen Saturation (Admit)  99 %  99 % 98 %   Oxygen Saturation (Exercise)  93 %  92 % 95 %   Oxygen Saturation (Exit)  97 %  95 % 95 %   Rating of Perceived Exertion (Exercise)  _5 Perceived Dyspnea (Exercise)  _6 Symptoms  None  None None   Comments  Home Exercise Given 06/30/15 Home Exercise Given 06/30/15 Home Exercise Given 06/30/15 Home Exercise Given 06/30/15   Duration  Progress to 45 minutes of aerobic exercise without signs/symptoms of physical distress Progress to 45 minutes of aerobic exercise  without signs/symptoms of physical distress Progress to 45 minutes of aerobic exercise without signs/symptoms of physical distress Progress to 45 minutes of aerobic exercise without signs/symptoms of physical distress   Intensity  THRR New  93-122 THRR New  93-122  THRR unchanged  93-122 THRR unchanged   Progression   Progression  Continue to progress workloads to maintain intensity without signs/symptoms of physical distress. Continue to progress workloads to maintain intensity without signs/symptoms of physical distress. Continue to progress workloads to maintain intensity without signs/symptoms of physical distress. Continue to progress workloads to maintain intensity without signs/symptoms of physical distress.   Average METs  2.4 2.4 2.8 2.9   Resistance Training   Training Prescription _0    Weight _1 Reps 10-12 10-12 10-12 10-12 10-12   Interval Training   Interval Training  No No No No   Treadmill   MPH  _2 Grade  0 0.5 0.5 0   Minutes  _3 METs  2.53 2.53 2.67 2.53   Recumbant Bike   Level  _4 RPM  56 56 50 60   Watts  _5 Minutes  _6 METs    2.7 3   NuStep   Level _7 Watts 40 39 39 42 55   Minutes _8 METs  2.2 2.2 3 2.9   Biostep-RELP   Level 2       Watts 50       Minutes 15       Home Exercise Plan   Plans to continue exercise at  [employer] (comment)  [employer] (comment)  [employer] (comment)  [employer]   Frequency  Add 3 additional days to program exercise sessions.  Add 3 additional days to program exercise sessions. Add 3 additional days to program exercise sessions.      Exercise Comments:     Exercise Comments      05/26/15 1231 06/30/15 1210 07/01/15 1443 07/09/15 1219 07/15/15 1350   Exercise Comments Leda Gauze did not like the XR machine because it hurt her back. She moved to the RB machine instead and  tolerated it well with no back pain.  Reviewed home exercise with pt today.  Pt plans to continue to go to [facility] gym for exercise.  She has been doing the stepper, treadmill, and bike there as well.  She was encouraged to go to the water aerobics classes as well for her back. Reviewed THR, pulse, RPE, sign and symptoms, and when to call 911 or MD.  Also discussed weather considerations and indoor options.  We discussed purchasing a pulse oximeter for home use to know that her oxygen levels are good during exercise.Pt voiced understanding. Yarely is dong well with her exercise progression.  She almost half way through the program.  At mid point we will do a 6 min walk test to assess her progress.  We aim to see continued progress. Walk test was 25 feet less than pre test.  Leda Gauze did Nustep at [facility] yesterday and stated her legs are sore today.  She has increased her intensity level in regular sessions on equipment. Kalany is doing well with her exercise.  She is going to [place] on her off days.  We have added incline to her treadmil now.  We will continue to work with her to make progress toward improving her six minute walk test.     07/30/15 1434 08/08/15 1059  Exercise Comments Monai continues to do well with exercise.  She has continued to see good progress.  We will keep working with her to see more sucess. Cigi  is planning on exercising at [facility] and already has an appt set up there since she is paying for that already as part of her rent.          Discharge Exercise Prescription (Final Exercise Prescription Changes):     Exercise Prescription Changes - 07/30/15 1400    Exercise Review   Progression Yes   Response to Exercise   Blood Pressure (Admit) 120/68 mmHg   Blood Pressure (Exercise) 142/70 mmHg   Blood Pressure (Exit) 120/60 mmHg   Heart Rate (Admit) 67 bpm   Heart Rate (Exercise) 90 bpm   Heart Rate (Exit) 71 bpm   Oxygen Saturation (Admit)  98 %   Oxygen Saturation (Exercise) 95 %   Oxygen Saturation (Exit) 95 %   Rating of Perceived Exertion (Exercise) 13   Perceived Dyspnea (Exercise) 2   Symptoms None   Comments Home Exercise Given 06/30/15   Duration Progress to 45 minutes of aerobic exercise without signs/symptoms of physical distress   Intensity THRR unchanged   Progression   Progression Continue to progress workloads to maintain intensity without signs/symptoms of physical distress.   Average METs 2.9   Resistance Training   Training Prescription Yes   Weight 3   Reps 10-12   Interval Training   Interval Training No   Treadmill   MPH 2   Grade 0   Minutes 15   METs 2.53   Recumbant Bike   Level 4   RPM 60   Minutes 15   METs 3   NuStep   Level 4   Watts 55   Minutes 15   METs 2.9   Home Exercise Plan   Plans to continue exercise at [employer] (comment)  [facility] [facility]   Frequency Add 3 additional days to program exercise sessions.       Nutrition:  Target Goals: Understanding of nutrition guidelines, daily intake of sodium <1545m, cholesterol <2043m calories 30% from fat and 7% or less from saturated fats, daily to have 5 or more servings of fruits and vegetables.  Biometrics:     Pre Biometrics - 05/13/15 1031    Pre Biometrics   Height 5' 3" (1.6 m)   Weight 188 lb 12.8 oz (85.639 kg)   Waist Circumference 38 inches   Hip Circumference 46 inches   Waist to Hip Ratio 0.83 %   BMI (Calculated) 33.5         Post Biometrics - 08/08/15 1044     Post  Biometrics   Height 5' 3" (1.6 m)   Weight 192 lb 1.6 oz (87.136 kg)   Waist Circumference 36.5 inches   Hip Circumference 47 inches   Waist to Hip Ratio 0.78 %   BMI (Calculated) 34.1      Nutrition Therapy Plan and Nutrition Goals:     Nutrition Therapy & Goals - 05/13/15 0905    Nutrition Therapy   Diet Prefers not to meet with the dietitian; Ms PhAlongeooks healthy, she does like sweets; low salt and less meats.       Nutrition Discharge: Rate Your Plate Scores:   Psychosocial: Target Goals: Acknowledge presence or absence of depression, maximize coping skills, provide positive support system. Participant is able to verbalize types and ability to use techniques and skills needed for reducing  stress and depression.  Initial Review & Psychosocial Screening:     Initial Psych Review & Screening - 05/13/15 [place]? Yes   Comments Ms Kilburg has good family support from her husband and daughter who is a Marine scientist. Her main concern is her husband and his lack of activity and socialization. Ms Hazelett loves people  and does have an active social life.   Barriers   Psychosocial barriers to participate in program The patient should benefit from training in stress management and relaxation.   Screening Interventions   Interventions Encouraged to exercise;Program counselor consult      Quality of Life Scores:     Quality of Life - 08/08/15 1214    Quality of Life Scores   Health/Function Pre 25.2 %   Health/Function Post 26.4 %   Health/Function % Change 4.76 %   Socioeconomic Pre 30 %   Socioeconomic Post 30 %   Socioeconomic % Change  0 %   Psych/Spiritual Pre 30 %   Psych/Spiritual Post 30 %   Psych/Spiritual % Change 0 %   Family Pre 28.8 %   Family Post 30 %   Family % Change 4.17 %   GLOBAL Pre 27.71 %   GLOBAL Post 28.36 %   GLOBAL % Change 2.35 %      PHQ-9:     Recent Review Flowsheet Data    Depression screen [facility] 2/9 08/08/2015 05/13/2015   Decreased Interest 0 0   Down, Depressed, Hopeless 0 0   PHQ - 2 Score 0 0   Altered sleeping 0 0   Tired, decreased energy 2 2   Change in appetite 0 0   Feeling bad or failure about yourself  0 0   Trouble concentrating 0 0   Moving slowly or fidgety/restless 0 0   Suicidal thoughts 0 0   PHQ-9 Score 2 2   Difficult doing work/chores Not difficult at all Somewhat difficult      Psychosocial  Evaluation and Intervention:     Psychosocial Evaluation - 05/19/15 1118    Psychosocial Evaluation & Interventions   Interventions Stress management education;Relaxation education;Encouraged to exercise with the program and follow exercise prescription   Comments Counselor met with Ms. Bloch today for initial psychosocial evaluation.  She was in this program last year and has had a lot of health issues occur since that time.  Ms. Currington hurt her foot in November, had double pneumonia in December, and fell and broke 6 ribs in January of this year.  She is also having problems with an ovary currently and will be seeing a doctor about that soon.  Ms. Klasen has a strong support system with a spouse and living in a retirement community.  She reports sleeping well (maybe "too well") now with a CPAP.  She has a history of anxiety but is on medications that help her with this and no current symptoms.  She reports her mood is typically positive and her major stress are her health issues.  Ms. Castleman has goals to increase her stamina and strength including breathing better.  She has been and will continue to work out at the gym at the retirement community where she resides.     Continued Psychosocial Services Needed Yes  Ms. Larabee will benefit from the psychoeducational components of this program.        Psychosocial Re-Evaluation:     Psychosocial Re-Evaluation  06/25/15 1117 07/09/15 1057 07/28/15 1136 08/08/15 1059     Psychosocial Re-Evaluation   Interventions   Relaxation education     Comments Counselor follow up with Ms. Hardin Negus today.  She reports having been sick this past week and the Dr. treated her for a bronchial infection.  She states she is feeling better now and worked out over the long weekend since this program was closed for the holiday.  Ms. Dearinger states she is feeling stronger since coming into this program and most of the machines are getting easier, with the  exception of the treadmill.  She realizes she has "30 extra pounds" on her that she didn't when she was here previously and that is impacting her ability to move with greater ease in generaly. Ms. Armbrister reports her mood is positive and she has minimal stress in her life at this time.  She also is intentionally eating healthier; decreasing her sugar intake and drinking mostly water. Counselor commended her on the hard work and progress made already in this program.   Follow up with Ms. Heyboer today reporting she is enjoying this program and realizes she will need a structured schedule of exercise to maintain her consistency upon completion of this program.  Ms. Dever reports that will likely be the [place] program here at [facility] and she plans to work out with her husband some in their retirement community gym as well.  Counselor commended Ms. Bady for her commitment to exercise and knowing her needs for consistency and structure to maintain this.   Counselor follow up with Ms. Delpino today reporting she is having trouble breathing today and has been "worrying" more lately.  It appears that Ms. Rider had some blood work indicating levels are increasing and may need to have a biopsy on a spot on her pancreas as well as surgery to remove the remainder of her left ovary due to some concerns expressed by her Dr.  Jamesetta So. Hardin Negus plans to go back to [facility] site to have additional blood work done prior to moving forward with this recommendation and expressed fear about "being put to sleep."  Counselor processed this with Ms. Carbon and discussed ways to manage stress and to change her "self talk" to being more helpful and less fear-producing.  Counselor will continue to follow with Ms. Pietro who reported she felt better after talking about this.  Counselor encouraged her to share with a close friend her fears and concerns since she is trying to protect her spouse and daughter since she  states they are "more worried" than she is currently.     Merritt  is planning on exercising at [facility] and already has an appt set up there since she is paying for that already as part of her rent.       Education: Education Goals: Education classes will be provided on a weekly basis, covering required topics. Participant will state understanding/return demonstration of topics presented.  Learning Barriers/Preferences:     Learning Barriers/Preferences - 05/13/15 3151    Learning Barriers/Preferences   Learning Barriers None   Learning Preferences Group Instruction;Individual Instruction;Pictoral;Skilled Demonstration;Verbal Instruction;Video;Written Material      Education Topics: Initial Evaluation Education: - Verbal, written and demonstration of respiratory meds, RPE/PD scales, oximetry and breathing techniques. Instruction on use of nebulizers and MDIs: cleaning and proper use, rinsing mouth with steroid doses and importance of monitoring MDI activations.          Pulmonary Rehab from 08/06/2015  in [facility] Cardiac and Pulmonary Rehab   Date  05/13/15   Educator  LB   Instruction Review Code  2- meets goals/outcomes      General Nutrition Guidelines/Fats and Fiber: -Group instruction provided by verbal, written material, models and posters to present the general guidelines for heart healthy nutrition. Gives an explanation and review of dietary fats and fiber.      Pulmonary Rehab from 08/06/2015 in [facility] Cardiac and Pulmonary Rehab   Date  06/30/15   Educator  CR   Instruction Review Code  2- meets goals/outcomes      Controlling Sodium/Reading Food Labels: -Group verbal and written material supporting the discussion of sodium use in heart healthy nutrition. Review and explanation with models, verbal and written materials for utilization of the food label.   Exercise Physiology & Risk Factors: - Group verbal and written instruction with models to review the exercise  physiology of the cardiovascular system and associated critical values. Details cardiovascular disease risk factors and the goals associated with each risk factor.      Pulmonary Rehab from 08/06/2015 in [facility] Cardiac and Pulmonary Rehab   Date  05/13/15   Educator  LB2      Aerobic Exercise & Resistance Training: - Gives group verbal and written discussion on the health impact of inactivity. On the components of aerobic and resistive training programs and the benefits of this training and how to safely progress through these programs.   Flexibility, Balance, General Exercise Guidelines: - Provides group verbal and written instruction on the benefits of flexibility and balance training programs. Provides general exercise guidelines with specific guidelines to those with heart or lung disease. Demonstration and skill practice provided.      Pulmonary Rehab from 08/06/2015 in [facility] Cardiac and Pulmonary Rehab   Date  06/04/15   Educator  BS   Instruction Review Code  2- meets goals/outcomes      Stress Management: - Provides group verbal and written instruction about the health risks of elevated stress, cause of high stress, and healthy ways to reduce stress.      Pulmonary Rehab from 08/06/2015 in [facility] Cardiac and Pulmonary Rehab   Date  06/11/15   Educator  [facility]   Instruction Review Code  2- meets goals/outcomes      Depression: - Provides group verbal and written instruction on the correlation between heart/lung disease and depressed mood, treatment options, and the stigmas associated with seeking treatment.      Pulmonary Rehab from 08/06/2015 in [facility] Cardiac and Pulmonary Rehab   Date  07/09/15   Educator  [facility]   Instruction Review Code  2- meets goals/outcomes      Exercise & Equipment Safety: - Individual verbal instruction and demonstration of equipment use and safety with use of the equipment.   Infection Prevention: - Provides verbal and written material to individual with  discussion of infection control including proper hand washing and proper equipment cleaning during exercise session.   Falls Prevention: - Provides verbal and written material to individual with discussion of falls prevention and safety.      Pulmonary Rehab from 08/06/2015 in [facility] Cardiac and Pulmonary Rehab   Date  05/13/15   Educator  LB   Instruction Review Code  2- meets goals/outcomes      Diabetes: - Individual verbal and written instruction to review signs/symptoms of diabetes, desired ranges of glucose level fasting, after meals and with exercise. Advice that pre and post exercise glucose checks will  be done for 3 sessions at entry of program.   Chronic Lung Diseases: - Group verbal and written instruction to review new updates, new respiratory medications, new advancements in procedures and treatments. Provide informative websites and "800" numbers of self-education.      Pulmonary Rehab from 08/06/2015 in [facility] Cardiac and Pulmonary Rehab   Date  06/02/15   Educator  L. Owens Shark, RT   Instruction Review Code  2- meets goals/outcomes      Lung Procedures: - Group verbal and written instruction to describe testing methods done to diagnose lung disease. Review the outcome of test results. Describe the treatment choices: Pulmonary Function Tests, ABGs and oximetry.   Energy Conservation: - Provide group verbal and written instruction for methods to conserve energy, plan and organize activities. Instruct on pacing techniques, use of adaptive equipment and posture/positioning to relieve shortness of breath.   Triggers: - Group verbal and written instruction to review types of environmental controls: home humidity, furnaces, filters, dust mite/pet prevention, HEPA vacuums. To discuss weather changes, air quality and the benefits of nasal washing.      Pulmonary Rehab from 08/06/2015 in [facility] Cardiac and Pulmonary Rehab   Date  05/19/15   Educator  LB   Instruction Review Code  2-  meets goals/outcomes      Exacerbations: - Group verbal and written instruction to provide: warning signs, infection symptoms, calling MD promptly, preventive modes, and value of vaccinations. Review: effective airway clearance, coughing and/or vibration techniques. Create an Sports administrator.      Pulmonary Rehab from 08/06/2015 in [facility] Cardiac and Pulmonary Rehab   Date  07/21/15   Educator  LB   Instruction Review Code  2- meets goals/outcomes      Oxygen: - Individual and group verbal and written instruction on oxygen therapy. Includes supplement oxygen, available portable oxygen systems, continuous and intermittent flow rates, oxygen safety, concentrators, and Medicare reimbursement for oxygen.   Respiratory Medications: - Group verbal and written instruction to review medications for lung disease. Drug class, frequency, complications, importance of spacers, rinsing mouth after steroid MDI's, and proper cleaning methods for nebulizers.   AED/CPR: - Group verbal and written instruction with the use of models to demonstrate the basic use of the AED with the basic ABC's of resuscitation.      Pulmonary Rehab from 08/06/2015 in [facility] Cardiac and Pulmonary Rehab   Date  06/13/15   Educator  CE   Instruction Review Code  2- meets goals/outcomes      Breathing Retraining: - Provides individuals verbal and written instruction on purpose, frequency, and proper technique of diaphragmatic breathing and pursed-lipped breathing. Applies individual practice skills.      Pulmonary Rehab from 08/06/2015 in [facility] Cardiac and Pulmonary Rehab   Date  05/13/15   Educator  LB   Instruction Review Code  2- meets goals/outcomes      Anatomy and Physiology of the Lungs: - Group verbal and written instruction with the use of models to provide basic lung anatomy and physiology related to function, structure and complications of lung disease.   Heart Failure: - Group verbal and written instruction on the  basics of heart failure: signs/symptoms, treatments, explanation of ejection fraction, enlarged heart and cardiomyopathy.      Pulmonary Rehab from 08/06/2015 in [facility] Cardiac and Pulmonary Rehab   Date  07/25/15 [facility] your Numbers]   Educator  C.[email or web address]   Instruction Review Code  2- meets goals/outcomes      Sleep Apnea: -  Individual verbal and written instruction to review Obstructive Sleep Apnea. Review of risk factors, methods for diagnosing and types of masks and machines for OSA.   Anxiety: - Provides group, verbal and written instruction on the correlation between heart/lung disease and anxiety, treatment options, and management of anxiety.      Pulmonary Rehab from 08/06/2015 in [facility] Cardiac and Pulmonary Rehab   Date  08/06/15   Educator  [facility]   Instruction Review Code  2- Meets goals/outcomes      Relaxation: - Provides group, verbal and written instruction about the benefits of relaxation for patients with heart/lung disease. Also provides patients with examples of relaxation techniques.      Pulmonary Rehab from 08/06/2015 in [facility] Cardiac and Pulmonary Rehab   Date  06/25/15   Educator  Kathreen Cornfield, [facility]   Instruction Review Code  2- Meets goals/outcomes      Knowledge Questionnaire Score:     Knowledge Questionnaire Score - 08/08/15 1159    Knowledge Questionnaire Score   Post Score 9/10       Core Components/Risk Factors/Patient Goals at Admission:     Personal Goals and Risk Factors at Admission - 05/13/15 0905    Core Components/Risk Factors/Patient Goals on Admission    Weight Management Yes;Weight Loss   Intervention Weight Management: Develop a combined nutrition and exercise program designed to reach desired caloric intake, while maintaining appropriate intake of nutrient and fiber, sodium and fats, and appropriate energy expenditure required for the weight goal.;Weight Management: Provide education and appropriate resources to help participant work on and  attain dietary goals.;Weight Management/Obesity: Establish reasonable short term and long term weight goals.;Obesity: Provide education and appropriate resources to help participant work on and attain dietary goals.   Admit Weight 189 lb (85.73 kg)   Goal Weight: Short Term 184 lb (83.462 kg)   Goal Weight: Long Term 159 lb (72.122 kg)   Expected Outcomes Short Term: Continue to assess and modify interventions until short term weight is achieved;Long Term: Adherence to nutrition and physical activity/exercise program aimed toward attainment of established weight goal;Weight Maintenance: Understanding of the daily nutrition guidelines, which includes 25-35% calories from fat, 7% or less cal from saturated fats, less than 223m cholesterol, less than 1.5gm of sodium, & 5 or more servings of fruits and vegetables daily;Weight Loss: Understanding of general recommendations for a balanced deficit meal plan, which promotes 1-2 lb weight loss per week and includes a negative energy balance of [phone] kcal/d;Understanding recommendations for meals to include 15-35% energy as protein, 25-35% energy from fat, 35-60% energy from carbohydrates, less than 2048mof dietary cholesterol, 20-35 gm of total fiber daily;Understanding of distribution of calorie intake throughout the day with the consumption of 4-5 meals/snacks;Weight Gain: Understanding of general recommendations for a high calorie, high protein meal plan that promotes weight gain by distributing calorie intake throughout the day with the consumption for 4-5 meals, snacks, and/or supplements   Sedentary Yes   Intervention Provide advice, education, support and counseling about physical activity/exercise needs.;Develop an individualized exercise prescription for aerobic and resistive training based on initial evaluation findings, risk stratification, comorbidities and participant's personal goals.   Expected Outcomes Achievement of increased cardiorespiratory  fitness and enhanced flexibility, muscular endurance and strength shown through measurements of functional capacity and personal statement of participant.   Increase Strength and Stamina Yes   Intervention Provide advice, education, support and counseling about physical activity/exercise needs.;Develop an individualized exercise prescription for aerobic and resistive training based on  initial evaluation findings, risk stratification, comorbidities and participant's personal goals.   Expected Outcomes Achievement of increased cardiorespiratory fitness and enhanced flexibility, muscular endurance and strength shown through measurements of functional capacity and personal statement of participant.   Improve shortness of breath with ADL's Yes   Intervention Provide education, individualized exercise plan and daily activity instruction to help decrease symptoms of SOB with activities of daily living.   Expected Outcomes Short Term: Achieves a reduction of symptoms when performing activities of daily living.   Develop more efficient breathing techniques such as purse lipped breathing and diaphragmatic breathing; and practicing self-pacing with activity Yes   Intervention Provide education, demonstration and support about specific breathing techniuqes utilized for more efficient breathing. Include techniques such as pursed lipped breathing, diaphragmatic breathing and self-pacing activity.   Expected Outcomes Short Term: Participant will be able to demonstrate and use breathing techniques as needed throughout daily activities.   Increase knowledge of respiratory medications and ability to use respiratory devices properly  Yes   Intervention Provide education and demonstration as needed of appropriate use of medications, inhalers, and oxygen therapy.  Ms Sawchuk has Symbicort Respimat as needed and rarely uses; CPAP with [place] -full face mask+   Expected Outcomes Short Term: Achieves understanding  of medications use. Understands that oxygen is a medication prescribed by physician. Demonstrates appropriate use of inhaler and oxygen therapy.      Core Components/Risk Factors/Patient Goals Review:      Goals and Risk Factor Review      05/19/15 1544 05/28/15 1000 06/02/15 1000 06/11/15 1525 06/13/15 1618   Core Components/Risk Factors/Patient Goals Review   Personal Goals Review Develop more efficient breathing techniques such as purse lipped breathing and diaphragmatic breathing and practicing self-pacing with activity. Sedentary Increase knowledge of respiratory medications and ability to use respiratory devices properly.;Improve shortness of breath with ADL's;Develop more efficient breathing techniques such as purse lipped breathing and diaphragmatic breathing and practicing self-pacing with activity.;Increase Strength and Stamina;Sedentary Develop more efficient breathing techniques such as purse lipped breathing and diaphragmatic breathing and practicing self-pacing with activity. Develop more efficient breathing techniques such as purse lipped breathing and diaphragmatic breathing and practicing self-pacing with activity.   Review Ms Alvarez used PLB breathing with her exercise goals today - good technique. Ms Manternach plans to increase her exercise by walking at [facility] walking track. She plans to walk on Tuesdays and Thursdays one mile. Ms Charlet exercised at the [employer] on her off days from [employer]. She is determined to work on weight loss and increase her stamina and be less sedentary. Walking the track at [place] hurt her back, so she did use the treadmill and Nu[employer]. we discussed her MDI's.. Ms Baumert rarely uses her Combivent. She deals with her shortness of breath by pacing and PLB. She states that she has improved her breath and notices this mainly with her housework. Ms Birkland is complaining of more shortness of breath. Her phusician ordered an ECHO test for  next week. We discussed the importance of the test to look at her pulmonary arterial pressure which could be affecting her breathing. She does not have much awareness of PAH. Reviewed PLB technique with Ms. Hardin Negus.  She has some issues with her nose and says that it is hard to breath through her nose.  I taught her that the expiratory phase should be much longer that her inspiratory phase and if she needed to breath through her mouth that  it was alright.   Expected Outcomes Continue using PLB breathing without cueing with her exercise and home activites. Increase her exercise to 5 days/week.  Educate Ms Segovia on [facility]. PLB will decrease her SOB while exercising.     06/13/15 1621 06/18/15 1446 07/09/15 1359 07/14/15 1630 07/18/15 1238   Core Components/Risk Factors/Patient Goals Review   Personal Goals Review Increase knowledge of respiratory medications and ability to use respiratory devices properly. Increase knowledge of respiratory medications and ability to use respiratory devices properly.;Improve shortness of breath with ADL's Sedentary Weight Management/Obesity Develop more efficient breathing techniques such as purse lipped breathing and diaphragmatic breathing and practicing self-pacing with activity.   Review Reviewed Ms. Gilman respiratory medications with her. She takes Combivent PRN and wears CPAP at night. Ms Rossi complained of congestion and shortness of breath when she arrived in [place]. She had mild scattered wheezes. Educated her on the importance of using her Combivent during episodes like this rather than taking it every day. Also educated her on the importance of calling her physician today and have an order for an antibiotic to preent an exacerbation. Danetra has added exercise at [facility] on the days she does not come to class. Discussed weight management with Ms Serrao - she is reducing her sweets and salt. Recommended "No Salt" to her. She is not on potassium which  would be a concern with htis salt. I reviewed PLB with Ms. Hardin Negus.  She demonstrates proper technique and knows when to use it.   Expected Outcomes She rarely takes her inhaler and is very compliant with her CPAP.  Ms. Peterkin seems to have a good understanding of her respiratory medications and equipment. Use Combivent properly and react quickly to a change in her lung condition - call her physician immediately for therapy order. Jeri will see continued improvement in overall fitness level. Continue encouraging Ms Menter to loss weight and continue with her exercise at [facility] on her days off from [place]. Using PLB will help decrease her SOB during exericse and ADLs.  This will help her preform better in both areas of life.      08/01/15 1000           Core Components/Risk Factors/Patient Goals Review   Review Ms Deiter plans to continue her exercise at [facility] gym. Her husband exercises there, so she can attend with him. If this does not work out, she will consider FF 2 days/week. She rarely uses her Albuterol MDI which is as needed, knows with an exacerbation, it can be helpful therapy. Ms Cumbie does PLB with her shortness of breath, especially on the treadmill.          Core Components/Risk Factors/Patient Goals at Discharge (Final Review):      Goals and Risk Factor Review - 08/01/15 1000    Core Components/Risk Factors/Patient Goals Review   Review Ms Radu plans to continue her exercise at [facility] gym. Her husband exercises there, so she can attend with him. If this does not work out, she will consider FF 2 days/week. She rarely uses her Albuterol MDI which is as needed, knows with an exacerbation, it can be helpful therapy. Ms Fite does PLB with her shortness of breath, especially on the treadmill.      ITP Comments:     ITP Comments      05/19/15 1000 05/26/15 1000 08/08/15 1057       ITP Comments Ms Asleson completed her first day of exercise  in  [place] and accomplished her exercise goals with acceptable vital signs and RPE/PD scales. She completed her weight goals and stayed for education class today. Ms Linarez is concerned about her results from a P125 lab test. An ultrasound was performed, and she is waiting for results. Durinda reports she has chronic ovarian pain. She is to see [facility] MD soon. Terrel was able to do her 6 minute walk test in the hallway. Larrisa  is planning on exercising at [facility] and already has an appt set up there since she is paying for that already as part of her rent.         Comments: Ms. Wingler is graduating 6 sessions early from [place].  She lives at [facility] and has an appointment with the physical therapist at their gym to help her transition into working out there.  She will continue to use the TM, RB and NS.  Thank you for the opportunity to work with your patient.

## 2015-08-08 NOTE — Progress Notes (Signed)
Discharge Summary  Patient Details  Name: Nicole Arnold MRN: [id number] Date of Birth: 1928-05-01 Referring Provider:            Pulmonary Rehab from 07/02/2015 in [facility]   Referring Provider  Simonne Maffucci MD       Number of Visits: 30  Reason for Discharge:  Patient reached a stable level of exercise. Patient independent in their exercise.  Smoking History:  History  Smoking status  . Former Smoker -- 0.50 packs/day for 30 years  . Types: Cigarettes  . Quit date: 01/25/1993  Smokeless tobacco  . Never Used    Diagnosis:  Pulmonary arterial hypertension ([place])  Sleep apnea  ADL UCSD:     Pulmonary Assessment Scores      05/13/15 0905 07/09/15 1000     ADL UCSD   ADL Phase Entry Mid    SOB Score total 48 55    Rest 0 0    Walk 2 1    Stairs 5 5    Bath 4 2    Dress 3 2    Shop 3 1       Initial Exercise Prescription:     Initial Exercise Prescription - 07/02/15 1400    Date of Initial Exercise RX and Referring Provider   Referring Provider Simonne Maffucci MD      Discharge Exercise Prescription (Final Exercise Prescription Changes):     Exercise Prescription Changes - 07/30/15 1400    Exercise Review   Progression Yes   Response to Exercise   Blood Pressure (Admit) 120/68 mmHg   Blood Pressure (Exercise) 142/70 mmHg   Blood Pressure (Exit) 120/60 mmHg   Heart Rate (Admit) 67 bpm   Heart Rate (Exercise) 90 bpm   Heart Rate (Exit) 71 bpm   Oxygen Saturation (Admit) 98 %   Oxygen Saturation (Exercise) 95 %   Oxygen Saturation (Exit) 95 %   Rating of Perceived Exertion (Exercise) 13   Perceived Dyspnea (Exercise) 2   Symptoms None   Comments Home Exercise Given 06/30/15   Duration Progress to 45 minutes of aerobic exercise without signs/symptoms of physical distress   Intensity THRR unchanged   Progression   Progression Continue to progress workloads to maintain intensity without signs/symptoms of physical  distress.   Average METs 2.9   Resistance Training   Training Prescription Yes   Weight 3   Reps 10-12   Interval Training   Interval Training No   Treadmill   MPH 2   Grade 0   Minutes 15   METs 2.53   Recumbant Bike   Level 4   RPM 60   Minutes 15   METs 3   NuStep   Level 4   Watts 55   Minutes 15   METs 2.9   Home Exercise Plan   Plans to continue exercise at [employer] (comment)  [employer]   Frequency Add 3 additional days to program exercise sessions.      Functional Capacity:     6 Minute Walk      05/13/15 1024 07/09/15 1213 08/08/15 1040   6 Minute Walk   Phase Initial Mid Program Discharge   Distance 1175 feet 1150 feet 1200 feet   Distance % Change   2 %   Walk Time 6 minutes 6 minutes 6 minutes   # of Rest Breaks  0 0   MPH  2.18 2.27   METS  2.26 2.76  RPE '17 13 15   '$ Perceived Dyspnea  '4 4 4   '$ VO2 Peak  7.9 2.56   Symptoms  No    Resting HR 64 bpm 68 bpm 68 bpm   Resting BP 134/70 mmHg 120/64 mmHg 142/78 mmHg   Max Ex. HR 115 bpm 126 bpm 118 bpm   Max Ex. BP 178/70 mmHg 188/74 mmHg 260/96 mmHg   2 Minute Post BP  150/64 mmHg 124/70 mmHg   Interval HR   Baseline HR  68    1 Minute HR  94    2 Minute HR  104    3 Minute HR  111    4 Minute HR  110    5 Minute HR  111    6 Minute HR  126    2 Minute Post HR  87    Interval Heart Rate?  Yes    Interval Oxygen   Interval Oxygen?  Yes    Baseline Oxygen Saturation %  98 % 98 %   Baseline Liters of Oxygen   0 L  Room Air   1 Minute Oxygen Saturation %  92 %    2 Minute Oxygen Saturation %  92 %    3 Minute Oxygen Saturation %  91 %    4 Minute Oxygen Saturation %  91 %    5 Minute Oxygen Saturation %  91 %    6 Minute Oxygen Saturation %  89 % 87 %   6 Minute Liters of Oxygen   0 L   2 Minute Post Oxygen Saturation %  97 %       Psychological, QOL, Others - Outcomes: PHQ 2/9: Depression screen PHQ 2/9 05/13/2015  Decreased Interest 0  Down, Depressed, Hopeless 0   PHQ - 2 Score 0  Altered sleeping 0  Tired, decreased energy 2  Change in appetite 0  Feeling bad or failure about yourself  0  Trouble concentrating 0  Moving slowly or fidgety/restless 0  Suicidal thoughts 0  PHQ-9 Score 2  Difficult doing work/chores Somewhat difficult    Quality of Life:     Quality of Life - 05/13/15 0905    Quality of Life Scores   Health/Function Pre 25.2 %   Socioeconomic Pre 30 %   Psych/Spiritual Pre 30 %   Family Pre 28.8 %   GLOBAL Pre 27.71 %      Personal Goals: Goals established at orientation with interventions provided to work toward goal.     Personal Goals and Risk Factors at Admission - 05/13/15 0905    Core Components/Risk Factors/Patient Goals on Admission    Weight Management Yes;Weight Loss   Intervention Weight Management: Develop a combined nutrition and exercise program designed to reach desired caloric intake, while maintaining appropriate intake of nutrient and fiber, sodium and fats, and appropriate energy expenditure required for the weight goal.;Weight Management: Provide education and appropriate resources to help participant work on and attain dietary goals.;Weight Management/Obesity: Establish reasonable short term and long term weight goals.;Obesity: Provide education and appropriate resources to help participant work on and attain dietary goals.   Admit Weight 189 lb (85.73 kg)   Goal Weight: Short Term 184 lb (83.462 kg)   Goal Weight: Long Term 159 lb (72.122 kg)   Expected Outcomes Short Term: Continue to assess and modify interventions until short term weight is achieved;Long Term: Adherence to nutrition and physical activity/exercise program aimed toward attainment of established weight goal;Weight Maintenance: Understanding  of the daily nutrition guidelines, which includes 25-35% calories from fat, 7% or less cal from saturated fats, less than '200mg'$  cholesterol, less than 1.5gm of sodium, & 5 or more servings of fruits  and vegetables daily;Weight Loss: Understanding of general recommendations for a balanced deficit meal plan, which promotes 1-2 lb weight loss per week and includes a negative energy balance of [phone] kcal/d;Understanding recommendations for meals to include 15-35% energy as protein, 25-35% energy from fat, 35-60% energy from carbohydrates, less than '200mg'$  of dietary cholesterol, 20-35 gm of total fiber daily;Understanding of distribution of calorie intake throughout the day with the consumption of 4-5 meals/snacks;Weight Gain: Understanding of general recommendations for a high calorie, high protein meal plan that promotes weight gain by distributing calorie intake throughout the day with the consumption for 4-5 meals, snacks, and/or supplements   Sedentary Yes   Intervention Provide advice, education, support and counseling about physical activity/exercise needs.;Develop an individualized exercise prescription for aerobic and resistive training based on initial evaluation findings, risk stratification, comorbidities and participant's personal goals.   Expected Outcomes Achievement of increased cardiorespiratory fitness and enhanced flexibility, muscular endurance and strength shown through measurements of functional capacity and personal statement of participant.   Increase Strength and Stamina Yes   Intervention Provide advice, education, support and counseling about physical activity/exercise needs.;Develop an individualized exercise prescription for aerobic and resistive training based on initial evaluation findings, risk stratification, comorbidities and participant's personal goals.   Expected Outcomes Achievement of increased cardiorespiratory fitness and enhanced flexibility, muscular endurance and strength shown through measurements of functional capacity and personal statement of participant.   Improve shortness of breath with ADL's Yes   Intervention Provide education, individualized exercise  plan and daily activity instruction to help decrease symptoms of SOB with activities of daily living.   Expected Outcomes Short Term: Achieves a reduction of symptoms when performing activities of daily living.   Develop more efficient breathing techniques such as purse lipped breathing and diaphragmatic breathing; and practicing self-pacing with activity Yes   Intervention Provide education, demonstration and support about specific breathing techniuqes utilized for more efficient breathing. Include techniques such as pursed lipped breathing, diaphragmatic breathing and self-pacing activity.   Expected Outcomes Short Term: Participant will be able to demonstrate and use breathing techniques as needed throughout daily activities.   Increase knowledge of respiratory medications and ability to use respiratory devices properly  Yes   Intervention Provide education and demonstration as needed of appropriate use of medications, inhalers, and oxygen therapy.  Ms Aldama has Symbicort Respimat as needed and rarely uses; CPAP with [place] -full face mask+   Expected Outcomes Short Term: Achieves understanding of medications use. Understands that oxygen is a medication prescribed by physician. Demonstrates appropriate use of inhaler and oxygen therapy.       Personal Goals Discharge:     Goals and Risk Factor Review      05/19/15 1544 05/28/15 1000 06/02/15 1000 06/11/15 1525 06/13/15 1618   Core Components/Risk Factors/Patient Goals Review   Personal Goals Review Develop more efficient breathing techniques such as purse lipped breathing and diaphragmatic breathing and practicing self-pacing with activity. Sedentary Increase knowledge of respiratory medications and ability to use respiratory devices properly.;Improve shortness of breath with ADL's;Develop more efficient breathing techniques such as purse lipped breathing and diaphragmatic breathing and practicing self-pacing with  activity.;Increase Strength and Stamina;Sedentary Develop more efficient breathing techniques such as purse lipped breathing and diaphragmatic breathing and practicing self-pacing with activity. Develop more efficient  breathing techniques such as purse lipped breathing and diaphragmatic breathing and practicing self-pacing with activity.   Review Ms Moss used PLB breathing with her exercise goals today - good technique. Ms Hyson plans to increase her exercise by walking at [facility] walking track. She plans to walk on Tuesdays and Thursdays one mile. Ms Harnden exercised at the [employer] on her off days from [employer]. She is determined to work on weight loss and increase her stamina and be less sedentary. Walking the track at [place] hurt her back, so she did use the treadmill and NuStep. we discussed her MDI's.. Ms Battle rarely uses her Combivent. She deals with her shortness of breath by pacing and PLB. She states that she has improved her breath and notices this mainly with her housework. Ms Gherardi is complaining of more shortness of breath. Her phusician ordered an ECHO test for next week. We discussed the importance of the test to look at her pulmonary arterial pressure which could be affecting her breathing. She does not have much awareness of PAH. Reviewed PLB technique with Ms. Hardin Negus.  She has some issues with her nose and says that it is hard to breath through her nose.  I taught her that the expiratory phase should be much longer that her inspiratory phase and if she needed to breath through her mouth that it was alright.   Expected Outcomes Continue using PLB breathing without cueing with her exercise and home activites. Increase her exercise to 5 days/week.  Educate Ms Reinhold on [facility] PLB will decrease her SOB while exercising.     06/13/15 1621 06/18/15 1446 07/09/15 1359 07/14/15 1630 07/18/15 1238   Core Components/Risk Factors/Patient Goals Review   Personal Goals  Review Increase knowledge of respiratory medications and ability to use respiratory devices properly. Increase knowledge of respiratory medications and ability to use respiratory devices properly.;Improve shortness of breath with ADL's Sedentary Weight Management/Obesity Develop more efficient breathing techniques such as purse lipped breathing and diaphragmatic breathing and practicing self-pacing with activity.   Review Reviewed Ms. Fraley respiratory medications with her. She takes Combivent PRN and wears CPAP at night. Ms Weinmann complained of congestion and shortness of breath when she arrived in [place]. She had mild scattered wheezes. Educated her on the importance of using her Combivent during episodes like this rather than taking it every day. Also educated her on the importance of calling her physician today and have an order for an antibiotic to preent an exacerbation. Kelani has added exercise at [facility] on the days she does not come to class. Discussed weight management with Ms Rishel - she is reducing her sweets and salt. Recommended "No Salt" to her. She is not on potassium which would be a concern with htis salt. I reviewed PLB with Ms. Hardin Negus.  She demonstrates proper technique and knows when to use it.   Expected Outcomes She rarely takes her inhaler and is very compliant with her CPAP.  Ms. Hietpas seems to have a good understanding of her respiratory medications and equipment. Use Combivent properly and react quickly to a change in her lung condition - call her physician immediately for therapy order. Ludella will see continued improvement in overall fitness level. Continue encouraging Ms Rohner to loss weight and continue with her exercise at [facility] on her days off from [place]. Using PLB will help decrease her SOB during exericse and ADLs.  This will help her preform better in both areas of life.  08/01/15 1000           Core Components/Risk Factors/Patient  Goals Review   Review Ms Consoli plans to continue her exercise at [facility] gym. Her husband exercises there, so she can attend with him. If this does not work out, she will consider FF 2 days/week. She rarely uses her Albuterol MDI which is as needed, knows with an exacerbation, it can be helpful therapy. Ms Gfeller does PLB with her shortness of breath, especially on the treadmill.          Nutrition & Weight - Outcomes:     Pre Biometrics - 05/13/15 1031    Pre Biometrics   Height '5\' 3"'$  (1.6 m)   Weight 188 lb 12.8 oz (85.639 kg)   Waist Circumference 38 inches   Hip Circumference 46 inches   Waist to Hip Ratio 0.83 %   BMI (Calculated) 33.5         Post Biometrics - 08/08/15 1044     Post  Biometrics   Height '5\' 3"'$  (1.6 m)   Weight 192 lb 1.6 oz (87.136 kg)   Waist Circumference 36.5 inches   Hip Circumference 47 inches   Waist to Hip Ratio 0.78 %   BMI (Calculated) 34.1      Nutrition:     Nutrition Therapy & Goals - 05/13/15 0905    Nutrition Therapy   Diet Prefers not to meet with the dietitian; Ms Colasurdo cooks healthy, she does like sweets; low salt and less meats.      Nutrition Discharge:   Education Questionnaire Score:     Knowledge Questionnaire Score - 05/13/15 0905    Knowledge Questionnaire Score   Pre Score 6/10      Goals reviewed with patient; copy given to patient.

## 2015-12-17 ENCOUNTER — Other Ambulatory Visit: Payer: Self-pay | Admitting: Internal Medicine

## 2015-12-17 DIAGNOSIS — Z1231 Encounter for screening mammogram for malignant neoplasm of breast: Secondary | ICD-10-CM

## 2016-01-27 ENCOUNTER — Ambulatory Visit (INDEPENDENT_AMBULATORY_CARE_PROVIDER_SITE_OTHER): Payer: Medicare Other | Admitting: Podiatry

## 2016-01-27 VITALS — BP 149/107 | HR 66 | Temp 97.9°F | Resp 16

## 2016-01-27 DIAGNOSIS — L03039 Cellulitis of unspecified toe: Secondary | ICD-10-CM

## 2016-01-27 DIAGNOSIS — L600 Ingrowing nail: Secondary | ICD-10-CM

## 2016-01-27 DIAGNOSIS — M79676 Pain in unspecified toe(s): Secondary | ICD-10-CM | POA: Diagnosis not present

## 2016-01-27 NOTE — Progress Notes (Signed)
   Subjective:    Patient ID: Nicole Arnold, female    DOB: [birth year], 83 y.o.   MRN: [id number]  HPI    Review of Systems  Respiratory: Positive for shortness of breath.        Objective:   Physical Exam        Assessment & Plan:

## 2016-01-27 NOTE — Progress Notes (Signed)
Patient ID: Nicole Arnold, female   DOB: Nov 28, 1928, 83 y.o.   MRN: [id number] Subjective: Patient presents today for evaluation of pain in toe(s). Patient is concerned for possible ingrown nail. Patient states that the pain has been present for a few weeks now. There are no alleviating factors. Patient states that she has had ingrown toenails taken out approximately 6 times in the past without alleviation of symptoms. Patient states that she would like the total left great toenail removed today for a permanent, definitive treatment option.  Objective:  General: Well developed, nourished, in no acute distress, alert and oriented x3   Dermatology: Skin is warm, dry and supple bilateral. Lateral border of the right great toe as well as the left great toenail appears to be erythematous with evidence of an ingrowing nail. Pain on palpation noted to the border of the nail fold. The remaining nails appear unremarkable at this time. There are no open sores, lesions.  Vascular: Dorsalis Pedis artery and Posterior Tibial artery pedal pulses palpable. No lower extremity edema noted.   Neruologic: Grossly intact via light touch bilateral.  Musculoskeletal: Muscular strength within normal limits in all groups bilateral. Normal range of motion noted to all pedal and ankle joints.   Assesement: #1 Paronychia with ingrowing nail right great toe lateral border and the left great toenail #2 Pain in toe #3 Incurvated nail  Plan of Care:  1. Patient evaluated.  2. Discussed treatment alternatives and plan of care. Explained nail avulsion procedure and post procedure course to patient. 3. Patient opted for permanent partial nail avulsion to the lateral border of the right great toe. Patient also opted for permanent total nail avulsion to the left great toenail.  4. Prior to procedure, local anesthesia infiltration utilized using 3 ml of a 50:50 mixture of 2% plain lidocaine and 0.5% plain marcaine in a normal  hallux block fashion and a betadine prep performed.  5. Partial permanent nail avulsion with chemical matrixectomy performed using [id number] applications of phenol followed by alcohol flush.  6. Light dressing applied. 7. Return to clinic in 2 weeks.   Edrick Kins, DPM [facility]  Dr. Edrick Kins, [address]                                        [place], [place] [address]                Office [phone]  Fax [phone]

## 2016-01-27 NOTE — Patient Instructions (Signed)

## 2016-02-02 ENCOUNTER — Ambulatory Visit
Admission: RE | Admit: 2016-02-02 | Discharge: 2016-02-02 | Disposition: A | Discharge status: Home / Self Care | Payer: Medicare Other | Source: Ambulatory Visit | Attending: Internal Medicine | Admitting: Internal Medicine

## 2016-02-02 DIAGNOSIS — Z1231 Encounter for screening mammogram for malignant neoplasm of breast: Secondary | ICD-10-CM | POA: Diagnosis present

## 2016-02-04 ENCOUNTER — Other Ambulatory Visit: Payer: Self-pay | Admitting: Internal Medicine

## 2016-02-04 DIAGNOSIS — R928 Other abnormal and inconclusive findings on diagnostic imaging of breast: Secondary | ICD-10-CM

## 2016-02-10 ENCOUNTER — Ambulatory Visit: Payer: Medicare Other | Admitting: Podiatry

## 2016-02-24 ENCOUNTER — Ambulatory Visit
Admission: RE | Admit: 2016-02-24 | Discharge: 2016-02-24 | Disposition: A | Discharge status: Home / Self Care | Payer: Medicare Other | Source: Ambulatory Visit | Attending: Internal Medicine | Admitting: Internal Medicine

## 2016-02-24 DIAGNOSIS — R928 Other abnormal and inconclusive findings on diagnostic imaging of breast: Secondary | ICD-10-CM

## 2016-03-09 ENCOUNTER — Ambulatory Visit (INDEPENDENT_AMBULATORY_CARE_PROVIDER_SITE_OTHER): Payer: Medicare Other | Admitting: Podiatry

## 2016-03-09 DIAGNOSIS — M79676 Pain in unspecified toe(s): Secondary | ICD-10-CM | POA: Diagnosis not present

## 2016-03-09 DIAGNOSIS — S91209D Unspecified open wound of unspecified toe(s) with damage to nail, subsequent encounter: Secondary | ICD-10-CM

## 2016-03-09 DIAGNOSIS — L608 Other nail disorders: Secondary | ICD-10-CM

## 2016-03-09 DIAGNOSIS — M79609 Pain in unspecified limb: Secondary | ICD-10-CM

## 2016-03-09 DIAGNOSIS — B351 Tinea unguium: Secondary | ICD-10-CM | POA: Diagnosis not present

## 2016-03-09 DIAGNOSIS — S91109D Unspecified open wound of unspecified toe(s) without damage to nail, subsequent encounter: Secondary | ICD-10-CM

## 2016-03-09 DIAGNOSIS — L603 Nail dystrophy: Secondary | ICD-10-CM

## 2016-03-09 NOTE — Progress Notes (Signed)
   Subjective: Patient presents today 2 weeks post ingrown nail permanent nail avulsion procedure. Patient states that the toe and nail fold is feeling much better. Patient also presents for elongated, thickened toenails. Patient states that she's unable to trim her own nails. Patient expresses pain with her toenails in shoe gear and during ambulation.  Objective: Skin is warm, dry and supple. Nail and respective nail fold appears to be healing appropriately. Open wound to the associated nail fold with a granular wound base and moderate amount of fibrotic tissue. Minimal drainage noted. Mild erythema around the periungual region likely due to phenol chemical matricectomy. Hyperkeratotic, onychodystrophy, discolored nails noted 1-5 bilateral  Assessment: #1 postop permanent partial nail avulsion right great toe lateral border and the left great toenail #2 open wound periungual nail fold of respective digit.   Plan of care: #1 patient was evaluated  #2 debridement of open wound was performed to the periungual border of the respective toe using a currette. Antibiotic ointment and Band-Aid was applied. #3 mechanical debridement of nails 1-5 bilateral feet was performed using a nail nipper without incident. #4 patient is to return to clinic on a PRN  Basis.  On neck visit we will consider partial permanent nail avulsion procedures to the second digits medially bilateral  Edrick Kins, DPM [facility]  Dr. Edrick Kins, [address]                                        [place], [place] [address]                Office [phone]  Fax [phone]

## 2016-03-29 ENCOUNTER — Ambulatory Visit (INDEPENDENT_AMBULATORY_CARE_PROVIDER_SITE_OTHER): Payer: Medicare Other | Admitting: Vascular Surgery

## 2016-03-29 ENCOUNTER — Encounter (INDEPENDENT_AMBULATORY_CARE_PROVIDER_SITE_OTHER): Payer: Self-pay | Admitting: Vascular Surgery

## 2016-03-29 VITALS — BP 157/72 | HR 69 | Resp 17 | Ht 63.0 in | Wt 189.6 lb

## 2016-03-29 DIAGNOSIS — I890 Lymphedema, not elsewhere classified: Secondary | ICD-10-CM | POA: Diagnosis not present

## 2016-03-29 DIAGNOSIS — I872 Venous insufficiency (chronic) (peripheral): Secondary | ICD-10-CM | POA: Diagnosis not present

## 2016-03-29 DIAGNOSIS — J432 Centrilobular emphysema: Secondary | ICD-10-CM | POA: Diagnosis not present

## 2016-03-29 DIAGNOSIS — G4733 Obstructive sleep apnea (adult) (pediatric): Secondary | ICD-10-CM

## 2016-03-29 NOTE — Progress Notes (Signed)
MRN : [id number]  MERIDIAN SCHERGER is a 83 y.o. (06/26/1928) female who presents with chief complaint of  Chief Complaint  Patient presents with  . Follow-up  .  History of Present Illness: The patient returns to the office for followup evaluation regarding leg swelling. The swelling has persisted but she feels is minimal and the pain associated with swelling is essentially gone. There have not been any interval development of a ulcerations or wounds.  Since the previous visit the patient has been wearing graduated compression stockings and has noted significant improvement in the lymphedema.  The patient denies changes in claudication symptoms or new rest pain symptoms. No new ulcers or wounds of the foot.  The patient's blood pressure has been stable and relatively well controlled. The patient denies amaurosis fugax or recent TIA symptoms. There are no recent neurological changes noted. The patient denies history of DVT, PE or superficial thrombophlebitis. The patient denies recent episodes of angina or shortness of breath.  Current Meds  Medication Sig  . ADVAIR DISKUS 250-50 MCG/DOSE AEPB   . ALPRAZolam (XANAX) 0.5 MG tablet Take 0.5 mg by mouth 3 (three) times daily as needed for anxiety.  Marland Kitchen amLODipine (NORVASC) 10 MG tablet Take 10 mg by mouth daily.  Marland Kitchen aspirin 81 MG tablet Take 81 mg by mouth daily.  . Calcium-Vitamin D (CALTRATE 600 PLUS-VIT D PO) Take 1 tablet by mouth 2 (two) times daily.  . lansoprazole (PREVACID) 30 MG capsule Take 30 mg by mouth daily at 12 noon.  Marland Kitchen levothyroxine (SYNTHROID, LEVOTHROID) 50 MCG tablet Take 50 mcg by mouth daily before breakfast.  . Multiple Vitamins-Minerals (CENTRUM SILVER ADULT 50+ PO) Take 1 tablet by mouth daily.  Marland Kitchen PARoxetine (PAXIL) 20 MG tablet Take 20 mg by mouth daily.  . raloxifene (EVISTA) 60 MG tablet Take 60 mg by mouth daily.  . vitamin E 400 UNIT capsule Take 400 Units by mouth daily.    Past Medical History:    Diagnosis Date  . Cataract   . Chronic constipation   . GERD (gastroesophageal reflux disease)   . Osteopenia   . Pancreatitis     Past Surgical History:  Procedure Laterality Date  . APPENDECTOMY  1949  . CATARACT EXTRACTION Right 1987  . CATARACT EXTRACTION Left 1986  . detached retina repair Right 1991  . OOPHORECTOMY  1959   right ovary, part of left  . RETINAL DETACHMENT SURGERY Left 1997  . uterine hysterectomy  1978    Social History Social History  Substance Use Topics  . Smoking status: Former Smoker    Packs/day: 0.50    Years: 30.00    Types: Cigarettes    Quit date: 01/25/1993  . Smokeless tobacco: Never Used  . Alcohol use Not on file    Family History Family History  Problem Relation Age of Onset  . Breast cancer Neg Hx   No family history of bleeding/clotting disorders, porphyria or autoimmune disease   Allergies  Allergen Reactions  . Amoxicillin     Mouth/tongue/throat swelling  . Codeine     vomiting  . Neosporin [Neomycin-Bacitracin Zn-Polymyx]   . Sulfa Antibiotics      REVIEW OF SYSTEMS (Negative unless checked)  Constitutional: '[]'$ Weight loss  '[]'$ Fever  '[]'$ Chills Cardiac: '[]'$ Chest pain   '[]'$ Chest pressure   '[]'$ Palpitations   '[]'$ Shortness of breath when laying flat   '[]'$ Shortness of breath with exertion. Vascular:  '[]'$ Pain in legs with walking   '[]'$ Pain in legs at  rest  '[]'$ History of DVT   '[]'$ Phlebitis   '[x]'$ Swelling in legs   '[]'$ Varicose veins   '[]'$ Non-healing ulcers Pulmonary:   '[]'$ Uses home oxygen   '[]'$ Productive cough   '[]'$ Hemoptysis   '[]'$ Wheeze  '[]'$ COPD   '[]'$ Asthma Neurologic:  '[]'$ Dizziness   '[]'$ Seizures   '[]'$ History of stroke   '[]'$ History of TIA  '[]'$ Aphasia   '[]'$ Vissual changes   '[]'$ Weakness or numbness in arm   '[]'$ Weakness or numbness in leg Musculoskeletal:   '[]'$ Joint swelling   '[]'$ Joint pain   '[]'$ Low back pain Hematologic:  '[]'$ Easy bruising  '[]'$ Easy bleeding   '[]'$ Hypercoagulable state   '[]'$ Anemic Gastrointestinal:  '[]'$ Diarrhea   '[]'$ Vomiting  '[]'$ Gastroesophageal  reflux/heartburn   '[]'$ Difficulty swallowing. Genitourinary:  '[]'$ Chronic kidney disease   '[]'$ Difficult urination  '[]'$ Frequent urination   '[]'$ Blood in urine Skin:  '[]'$ Rashes   '[]'$ Ulcers  Psychological:  '[]'$ History of anxiety   '[]'$  History of major depression.  Physical Examination  Vitals:   03/29/16 1612  BP: (!) 157/72  Pulse: 69  Resp: 17  Weight: 189 lb 9.6 oz (86 kg)  Height: '5\' 3"'$  (1.6 m)   Body mass index is 33.59 kg/m. Gen: WD/WN, NAD Head: [place]/AT, No temporalis wasting.  Ear/Nose/Throat: Hearing grossly intact, nares w/o erythema or drainage, poor dentition Eyes: PER, EOMI, sclera nonicteric.  Neck: Supple, no masses.  No bruit or JVD.  Pulmonary:  Good air movement, clear to auscultation bilaterally, no use of accessory muscles.  Cardiac: RRR, normal S1, S2, no Murmurs. Vascular: 3+ pitting edema bilaterally with mild venous stasis dermatitis Vessel Right Left  Radial Palpable Palpable  Ulnar Palpable Palpable  Brachial Palpable Palpable  Carotid Palpable Palpable  Femoral Palpable Palpable  Popliteal Palpable Palpable  PT Palpable Palpable  DP Palpable Palpable   Gastrointestinal: soft, non-distended. No guarding/no peritoneal signs.  Musculoskeletal: M/S 5/5 throughout.  No deformity or atrophy.  Neurologic: CN 2-12 intact. Pain and light touch intact in extremities.  Symmetrical.  Speech is fluent. Motor exam as listed above. Psychiatric: Judgment intact, Mood & affect appropriate for pt's clinical situation. Dermatologic: venous rashes no ulcers noted.  No changes consistent with cellulitis. Lymph : No Cervical lymphadenopathy, no lichenification or skin changes of chronic lymphedema.  CBC No results found for: WBC, HGB, HCT, MCV, PLT  BMET No results found for: NA, K, CL, CO2, GLUCOSE, BUN, CREATININE, CALCIUM, GFRNONAA, GFRAA CrCl cannot be calculated (No order found.).  COAG No results found for: INR, PROTIME  Radiology No results  found.  Assessment/Plan 1. Lymphedema Recommend:  No surgery or intervention at this point in time.    I have reviewed my previous discussion with the patient regarding swelling and why it causes symptoms.  Patient will continue wearing graduated compression stockings class 1 (20-30 mmHg) on a daily basis. The patient will  beginning wearing the stockings first thing in the morning and removing them in the evening. The patient is instructed specifically not to sleep in the stockings.    In addition, behavioral modification including several periods of elevation of the lower extremities during the day will be continued.  This was reviewed with the patient during the initial visit.  The patient will also continue routine exercise, especially walking on a daily basis as was discussed during the initial visit.    Despite conservative treatments including graduated compression therapy class 1 and behavioral modification including exercise and elevation the patient  has not obtained adequate control of the lymphedema.  The patient still has stage 3 lymphedema and therefore,  I believe that a lymph pump should be added to improve the control of the patient's lymphedema.  Additionally, a lymph pump is warranted because it will reduce the risk of cellulitis and ulceration in the future.  Patient should follow-up in six months    2. Chronic venous insufficiency See #1  3. OSA (obstructive sleep apnea) Continue CPAP every night  4. Centrilobular emphysema (HCC) Continue pulmonary medications and aerosols as already ordered, these medications have been reviewed and there are no changes at this time.     Hortencia Pilar, MD  03/29/2016 4:19 PM

## 2016-04-03 DIAGNOSIS — I890 Lymphedema, not elsewhere classified: Secondary | ICD-10-CM | POA: Insufficient documentation

## 2016-04-03 DIAGNOSIS — I872 Venous insufficiency (chronic) (peripheral): Secondary | ICD-10-CM | POA: Insufficient documentation

## 2016-05-28 ENCOUNTER — Emergency Department
Admission: EM | Admit: 2016-05-28 | Discharge: 2016-05-28 | Disposition: A | Discharge status: Home / Self Care | Payer: Medicare Other | Attending: Emergency Medicine | Admitting: Emergency Medicine

## 2016-05-28 ENCOUNTER — Emergency Department: Payer: Medicare Other

## 2016-05-28 ENCOUNTER — Encounter: Payer: Self-pay | Admitting: Emergency Medicine

## 2016-05-28 DIAGNOSIS — Y929 Unspecified place or not applicable: Secondary | ICD-10-CM | POA: Diagnosis not present

## 2016-05-28 DIAGNOSIS — S299XXA Unspecified injury of thorax, initial encounter: Secondary | ICD-10-CM | POA: Diagnosis present

## 2016-05-28 DIAGNOSIS — Z7982 Long term (current) use of aspirin: Secondary | ICD-10-CM | POA: Insufficient documentation

## 2016-05-28 DIAGNOSIS — Z87891 Personal history of nicotine dependence: Secondary | ICD-10-CM | POA: Insufficient documentation

## 2016-05-28 DIAGNOSIS — Z79899 Other long term (current) drug therapy: Secondary | ICD-10-CM | POA: Insufficient documentation

## 2016-05-28 DIAGNOSIS — Y999 Unspecified external cause status: Secondary | ICD-10-CM | POA: Diagnosis not present

## 2016-05-28 DIAGNOSIS — W1839XA Other fall on same level, initial encounter: Secondary | ICD-10-CM | POA: Diagnosis not present

## 2016-05-28 DIAGNOSIS — W19XXXA Unspecified fall, initial encounter: Secondary | ICD-10-CM

## 2016-05-28 DIAGNOSIS — G9389 Other specified disorders of brain: Secondary | ICD-10-CM | POA: Diagnosis not present

## 2016-05-28 DIAGNOSIS — S2241XA Multiple fractures of ribs, right side, initial encounter for closed fracture: Secondary | ICD-10-CM

## 2016-05-28 DIAGNOSIS — Y9301 Activity, walking, marching and hiking: Secondary | ICD-10-CM | POA: Insufficient documentation

## 2016-05-28 LAB — URINALYSIS, COMPLETE (UACMP) WITH MICROSCOPIC
Bacteria, UA: NONE SEEN
Bilirubin Urine: NEGATIVE
GLUCOSE, UA: NEGATIVE mg/dL
HGB URINE DIPSTICK: NEGATIVE
Ketones, ur: NEGATIVE mg/dL
Leukocytes, UA: NEGATIVE
Nitrite: NEGATIVE
PH: 7 (ref 5.0–8.0)
PROTEIN: NEGATIVE mg/dL
SPECIFIC GRAVITY, URINE: 1.009 (ref 1.005–1.030)

## 2016-05-28 LAB — CBC WITH DIFFERENTIAL/PLATELET
Basophils Absolute: 0 10*3/uL (ref 0–0.1)
Basophils Relative: 0 %
Eosinophils Absolute: 0.1 10*3/uL (ref 0–0.7)
Eosinophils Relative: 1 %
HCT: 38.1 % (ref 35.0–47.0)
Hemoglobin: 13 g/dL (ref 12.0–16.0)
LYMPHS ABS: 1.3 10*3/uL (ref 1.0–3.6)
LYMPHS PCT: 15 %
MCH: 28.8 pg (ref 26.0–34.0)
MCHC: 34.2 g/dL (ref 32.0–36.0)
MCV: 84 fL (ref 80.0–100.0)
MONO ABS: 0.7 10*3/uL (ref 0.2–0.9)
Monocytes Relative: 8 %
Neutro Abs: 6.6 10*3/uL — ABNORMAL HIGH (ref 1.4–6.5)
Neutrophils Relative %: 76 %
PLATELETS: 273 10*3/uL (ref 150–440)
RBC: 4.54 MIL/uL (ref 3.80–5.20)
RDW: 13.1 % (ref 11.5–14.5)
WBC: 8.8 10*3/uL (ref 3.6–11.0)

## 2016-05-28 LAB — COMPREHENSIVE METABOLIC PANEL
ALT: 19 U/L (ref 14–54)
AST: 24 U/L (ref 15–41)
Albumin: 3.7 g/dL (ref 3.5–5.0)
Alkaline Phosphatase: 50 U/L (ref 38–126)
Anion gap: 7 (ref 5–15)
BUN: 18 mg/dL (ref 6–20)
CHLORIDE: 97 mmol/L — AB (ref 101–111)
CO2: 25 mmol/L (ref 22–32)
CREATININE: 0.72 mg/dL (ref 0.44–1.00)
Calcium: 8.6 mg/dL — ABNORMAL LOW (ref 8.9–10.3)
Glucose, Bld: 117 mg/dL — ABNORMAL HIGH (ref 65–99)
Potassium: 4.4 mmol/L (ref 3.5–5.1)
Sodium: 129 mmol/L — ABNORMAL LOW (ref 135–145)
TOTAL PROTEIN: 7.4 g/dL (ref 6.5–8.1)
Total Bilirubin: 0.4 mg/dL (ref 0.3–1.2)

## 2016-05-28 LAB — TROPONIN I

## 2016-05-28 LAB — TYPE AND SCREEN
ABO/RH(D): A POS
ANTIBODY SCREEN: NEGATIVE

## 2016-05-28 LAB — APTT: aPTT: 30 seconds (ref 24–36)

## 2016-05-28 LAB — PROTIME-INR
INR: 1.13
Prothrombin Time: 14.6 seconds (ref 11.4–15.2)

## 2016-05-28 MED ORDER — ACETAMINOPHEN 325 MG PO TABS
650.0000 mg | ORAL_TABLET | Freq: Once | ORAL | Status: AC
  Administered 2016-05-28: 650 mg via ORAL
  Filled 2016-05-28: qty 2

## 2016-05-28 MED ORDER — ACETAMINOPHEN 325 MG PO TABS: 650.0000 mg | ORAL_TABLET | ORAL | 0 refills | Status: DC | PRN

## 2016-05-28 MED ORDER — SODIUM CHLORIDE 0.9 % IV BOLUS (SEPSIS)
500.0000 mL | Freq: Once | INTRAVENOUS | Status: AC
  Administered 2016-05-28: 500 mL via INTRAVENOUS

## 2016-05-28 NOTE — ED Provider Notes (Signed)
[facility] Emergency Department Provider Note  ____________________________________________   I have reviewed the triage vital signs and the nursing notes.   HISTORY  Chief Complaint Fall    HPI Nicole Arnold is a 83 y.o. female who has fallen multiple times in her life and has had many broken ribs in the past. She was walking her neighbor's dog yesterday and she fell and she feels that she injured her ribs again. She states that she was doing okay yesterday but today she had pain in her left ribs which seem significant. She denies trouble breathing although does hurt to breathe. She denies any other chest pain. She does not believe she hit her head. It was a non-syncopal fall. She states that she wants to be evaluated for her rib pain. She is adamant that she does not wish to be admitted to the hospital.      Past Medical History:  Diagnosis Date  . Cataract   . Chronic constipation   . GERD (gastroesophageal reflux disease)   . Osteopenia   . Pancreatitis     Patient Active Problem List   Diagnosis Date Noted  . Lymphedema 04/03/2016  . Chronic venous insufficiency 04/03/2016  . Pulmonary nodules 07/07/2015  . Pain of finger of left hand 01/07/2014  . OSA (obstructive sleep apnea) 09/07/2013  . Obesity, unspecified 09/07/2013  . Dyspnea 07/24/2013  . Emphysema lung ([place]) 07/24/2013  . Pulmonary hypertension ([place]) 07/24/2013  . Fatigue 07/24/2013    Past Surgical History:  Procedure Laterality Date  . APPENDECTOMY  1949  . CATARACT EXTRACTION Right 1987  . CATARACT EXTRACTION Left 1986  . detached retina repair Right 1991  . OOPHORECTOMY  1959   right ovary, part of left  . RETINAL DETACHMENT SURGERY Left 1997  . uterine hysterectomy  1978    Prior to Admission medications   Medication Sig Start Date End Date Taking? Authorizing Provider  ADVAIR DISKUS 250-50 MCG/DOSE AEPB Inhale 1 puff into the lungs 2 (two) times daily.  03/25/16   Yes Historical Provider, MD  ALPRAZolam Duanne Moron) 0.5 MG tablet Take 0.5 mg by mouth 3 (three) times daily as needed for anxiety.   Yes Historical Provider, MD  amLODipine (NORVASC) 5 MG tablet Take 5 mg by mouth daily.    Yes Historical Provider, MD  aspirin 81 MG tablet Take 81 mg by mouth daily.   Yes Historical Provider, MD  Calcium-Vitamin D (CALTRATE 600 PLUS-VIT D PO) Take 1 tablet by mouth daily.    Yes Historical Provider, MD  lansoprazole (PREVACID) 30 MG capsule Take 30 mg by mouth daily at 12 noon.   Yes Historical Provider, MD  levothyroxine (SYNTHROID, LEVOTHROID) 50 MCG tablet Take 50 mcg by mouth daily before breakfast.   Yes Historical Provider, MD  Multiple Vitamins-Minerals (CENTRUM SILVER ADULT 50+ PO) Take 1 tablet by mouth daily.   Yes Historical Provider, MD  PARoxetine (PAXIL) 30 MG tablet Take 30 mg by mouth daily.    Yes Historical Provider, MD  raloxifene (EVISTA) 60 MG tablet Take 60 mg by mouth daily.   Yes Historical Provider, MD  vitamin E 400 UNIT capsule Take 400 Units by mouth daily.   Yes Historical Provider, MD    Allergies Amoxicillin; Codeine; Neosporin [neomycin-bacitracin zn-polymyx]; and Sulfa antibiotics  Family History  Problem Relation Age of Onset  . Breast cancer Neg Hx     Social History Social History  Substance Use Topics  . Smoking status: Former Smoker  Packs/day: 0.50    Years: 30.00    Types: Cigarettes    Quit date: 01/25/1993  . Smokeless tobacco: Never Used  . Alcohol use No    Review of Systems Constitutional: No fever/chills Eyes: No visual changes. ENT: No sore throat. No stiff neck no neck pain Cardiovascular: Positive chest wall pain. Respiratory: Denies shortness of breath. Gastrointestinal: .  No diarrhea.  No constipation. Genitourinary: Negative for dysuria. Musculoskeletal: Negative lower extremity swelling Skin: Negative for rash. Neurological: Negative for severe headaches, focal weakness or  numbness. 10-point ROS otherwise negative.  ____________________________________________   PHYSICAL EXAM:  VITAL SIGNS: ED Triage Vitals  Enc Vitals Group     BP 05/28/16 1525 (!) 159/85     Pulse Rate 05/28/16 1525 61     Resp 05/28/16 1525 16     Temp 05/28/16 1525 98 F (36.7 C)     Temp Source 05/28/16 1525 Oral     SpO2 05/28/16 1525 98 %     Weight 05/28/16 1527 190 lb (86.2 kg)     Height 05/28/16 1527 '5\' 2"'$  (1.575 m)     Head Circumference --      Peak Flow --      Pain Score 05/28/16 1525 10     Pain Loc --      Pain Edu? --      Excl. in [place]? --     Constitutional: Alert and oriented. Well appearing and in no acute distress. Eyes: Conjunctivae are normal. PERRL. EOMI. Head: Atraumatic. Nose: No congestion/rhinnorhea. Mouth/Throat: Mucous membranes are moist.  Oropharynx non-erythematous. Neck: No stridor.   Nontender with no meningismus Cardiovascular: Normal rate, regular rhythm. Grossly normal heart sounds.  Good peripheral circulation. Chest: Palpation elicits tenderness to the upper right chest. No crepitus no flail chest. Respiratory: Normal respiratory effort.  No retractions. Lungs CTAB. Abdominal: Soft and nontender. No distention. No guarding no rebound Back:  There is no focal tenderness or step off.  there is no midline tenderness there are no lesions noted. there is no CVA tenderness Musculoskeletal: No lower extremity tenderness, no upper extremity tenderness. No joint effusions, no DVT signs strong distal pulses no edema Neurologic:  Normal speech and language. No gross focal neurologic deficits are appreciated.  Skin:  Skin is warm, dry and intact. No rash noted.Bruising noted to the right hand with no evidence of fracture Psychiatric: Mood and affect are anxious. Speech and behavior are normal.  ____________________________________________   LABS (all labs ordered are listed, but only abnormal results are displayed)  Labs Reviewed  CBC WITH  DIFFERENTIAL/PLATELET - Abnormal; Notable for the following:       Result Value   Neutro Abs 6.6 (*)    All other components within normal limits  COMPREHENSIVE METABOLIC PANEL - Abnormal; Notable for the following:    Sodium 129 (*)    Chloride 97 (*)    Glucose, Bld 117 (*)    Calcium 8.6 (*)    All other components within normal limits  URINALYSIS, COMPLETE (UACMP) WITH MICROSCOPIC - Abnormal; Notable for the following:    Color, Urine STRAW (*)    APPearance CLEAR (*)    Squamous Epithelial / LPF 0-5 (*)    All other components within normal limits  TROPONIN I  PROTIME-INR  APTT  TYPE AND SCREEN   ____________________________________________  EKG  I personally interpreted any EKGs ordered by me or triage Sinus rhythm rate 60 bpm no acute ST elevation or depression nonspecific ST  changes LAD noted ____________________________________________  RADIOLOGY  I reviewed any imaging ordered by me or triage that were performed during my shift and, if possible, patient and/or family made aware of any abnormal findings. ____________________________________________   PROCEDURES  Procedure(s) performed: None  Procedures  Critical Care performed: None  ____________________________________________   INITIAL IMPRESSION / ASSESSMENT AND PLAN / ED COURSE  Pertinent labs & imaging results that were available during my care of the patient were reviewed by me and considered in my medical decision making (see chart for details).  Discussed with patient and family extensively. Patient does have some broken ribs, again. I gave her Tylenol and she states her pain is greatly better. She is somewhat anxious. She very much wants to go home. I did describe her findings and showed the x-ray to the family. There is a tiny pleural effusion which could be a hemothorax. However, hemoglobin is reassuring vital signs are reassuring, her blood pressure is trending up and she states her longer  because she states she is anxious and wants to go home. Patient does take a baby aspirin I've advised her not to continue taking that for the next few days. She has no pulmonary symptoms. She does have incentive spirometry at home from her prior rib fractures. She does have a tiny questionable hemothorax but again this happened over 24 hours ago and if she were admitted to the hospital yesterday should be discharged at this time if the hemothorax was only this big therefore, I don't think is mandated that she be admitted. The reasons to admit her are observation for pulmonary toilet but she is breathing just fine at this time after Tylenol. She is adamant that she does not want to stay in the hospital. Patient and family are all at bedside. I've explained all of them the risks of going home and they would very much prefer to do so. She is not home alone. She is able to get around. She states she does not want to be in the hospital. Accordingly therefore we will discharge her home with her home medications and Tylenol which she can take for pain. She does not want "anything stronger". She is able to breathe with this. She has had multiple rib fractures in the past from prior falls. This was a non-syncopal event. There is no evidence that she has any other pathology present today and we will discharge her with extensive return precautions and follow-up given and understood. There is no evidence of liver or intra-abdominal pathology in her abdominal exam remains completely benign.    ____________________________________________   FINAL CLINICAL IMPRESSION(S) / ED DIAGNOSES  Final diagnoses:  None      This chart was dictated using voice recognition software.  Despite best efforts to proofread,  errors can occur which can change meaning.      Schuyler Amor, MD 05/28/16 [phone]

## 2016-05-28 NOTE — ED Notes (Signed)
Pt's daughter reports that pt prefers to go home rather than be admitted. Dr. Burlene Arnt stated to her that we could admit pt if she felt like it would be better, but pt wants to be at home. She lives with her husband, who can watch out for her.

## 2016-05-28 NOTE — ED Notes (Signed)
Pt. Going home with husband. 

## 2016-05-28 NOTE — ED Triage Notes (Signed)
Pt via ems from [facility]. She took a dog walking yesterday and it got away from her, causing her to fall. She hit her right ribcage, and the pain has been increasing since then. She states it hurts worse when she breathes in. Pt alert & oriented with NAD noted.

## 2016-05-28 NOTE — Discharge Instructions (Signed)
Use incentive spirometry every 15 minutes. Take Tylenol for the pain. Return to the emergency room for any new or worrisome symptoms including but not limited to increased chest pain, headache, shortness of breath, difficulty moving or breathing. We discussed admission to the hospital but you would prefer to go home. This is certainly your choice but does limit our ability to observe you, however, this happened over 24 hours ago and it is likely not unreasonable to go home. If you have any concerns we would like you to come back.

## 2016-06-15 ENCOUNTER — Encounter: Payer: Self-pay | Admitting: Emergency Medicine

## 2016-06-15 ENCOUNTER — Emergency Department: Payer: Medicare Other

## 2016-06-15 ENCOUNTER — Emergency Department
Admission: EM | Admit: 2016-06-15 | Discharge: 2016-06-15 | Disposition: A | Discharge status: Home / Self Care | Payer: Medicare Other | Attending: Emergency Medicine | Admitting: Emergency Medicine

## 2016-06-15 ENCOUNTER — Telehealth (INDEPENDENT_AMBULATORY_CARE_PROVIDER_SITE_OTHER): Payer: Self-pay | Admitting: Vascular Surgery

## 2016-06-15 DIAGNOSIS — M79671 Pain in right foot: Secondary | ICD-10-CM | POA: Insufficient documentation

## 2016-06-15 DIAGNOSIS — Z7982 Long term (current) use of aspirin: Secondary | ICD-10-CM | POA: Diagnosis not present

## 2016-06-15 DIAGNOSIS — Z87891 Personal history of nicotine dependence: Secondary | ICD-10-CM | POA: Insufficient documentation

## 2016-06-15 NOTE — ED Provider Notes (Signed)
[facility] Emergency Department Provider Note  ____________________________________________   I have reviewed the triage vital signs and the nursing notes.   HISTORY  Chief Complaint Foot Pain   History limited by: Not Limited   HPI Nicole Arnold is a 83 y.o. female who presents to the emergency department today because of foot pain. It is located in the right foot. It is located in the mid foot. It started roughly 3 days ago. It is worse with walking. The patient states that she has been walking more recently. She did have a fall a few weeks ago although does not think that she specifically hurt her foot. She does get some intermittent swelling with the pain. At that time my examination the patient stated that her swelling was not bad.   Past Medical History:  Diagnosis Date  . Cataract   . Chronic constipation   . GERD (gastroesophageal reflux disease)   . Osteopenia   . Pancreatitis     Patient Active Problem List   Diagnosis Date Noted  . Lymphedema 04/03/2016  . Chronic venous insufficiency 04/03/2016  . Pulmonary nodules 07/07/2015  . Pain of finger of left hand 01/07/2014  . OSA (obstructive sleep apnea) 09/07/2013  . Obesity, unspecified 09/07/2013  . Dyspnea 07/24/2013  . Emphysema lung ([place]) 07/24/2013  . Pulmonary hypertension ([place]) 07/24/2013  . Fatigue 07/24/2013    Past Surgical History:  Procedure Laterality Date  . APPENDECTOMY  1949  . CATARACT EXTRACTION Right 1987  . CATARACT EXTRACTION Left 1986  . detached retina repair Right 1991  . OOPHORECTOMY  1959   right ovary, part of left  . RETINAL DETACHMENT SURGERY Left 1997  . uterine hysterectomy  1978    Prior to Admission medications   Medication Sig Start Date End Date Taking? Authorizing Provider  acetaminophen (TYLENOL) 325 MG tablet Take 2 tablets (650 mg total) by mouth every 4 (four) hours as needed. 05/28/16 05/28/17  Schuyler Amor, MD  ADVAIR DISKUS  250-50 MCG/DOSE AEPB Inhale 1 puff into the lungs 2 (two) times daily.  03/25/16   [provider]  ALPRAZolam Duanne Moron) 0.5 MG tablet Take 0.5 mg by mouth 3 (three) times daily as needed for anxiety.    [provider]  amLODipine (NORVASC) 5 MG tablet Take 5 mg by mouth daily.     [provider]  aspirin 81 MG tablet Take 81 mg by mouth daily.    [provider]  Calcium-Vitamin D (CALTRATE 600 PLUS-VIT D PO) Take 1 tablet by mouth daily.     [provider]  lansoprazole (PREVACID) 30 MG capsule Take 30 mg by mouth daily at 12 noon.    [provider]  levothyroxine (SYNTHROID, LEVOTHROID) 50 MCG tablet Take 50 mcg by mouth daily before breakfast.    [provider]  Multiple Vitamins-Minerals (CENTRUM SILVER ADULT 50+ PO) Take 1 tablet by mouth daily.    [provider]  PARoxetine (PAXIL) 30 MG tablet Take 30 mg by mouth daily.     [provider]  raloxifene (EVISTA) 60 MG tablet Take 60 mg by mouth daily.    [provider]  vitamin E 400 UNIT capsule Take 400 Units by mouth daily.    [provider]    Allergies Amoxicillin; Codeine; Neosporin [neomycin-bacitracin zn-polymyx]; and Sulfa antibiotics  Family History  Problem Relation Age of Onset  . Breast cancer Neg Hx     Social History Social History  Substance Use Topics  . Smoking status: Former Smoker    Packs/day: 0.50    Years: 30.00    Types: Cigarettes    Quit date: 01/25/1993  . Smokeless tobacco: Never Used  . Alcohol use No    Review of Systems Constitutional: No fever/chills Cardiovascular: Denies chest pain. Respiratory: Denies shortness of breath. Gastrointestinal: No abdominal pain.  No nausea, no vomiting.  No diarrhea.   Musculoskeletal: Positive for right foot pain. Skin: Intermittent redness to right foot.  Neurological: Negative for headaches, focal weakness or  numbness.  ____________________________________________   PHYSICAL EXAM:  VITAL SIGNS: ED Triage Vitals  Enc Vitals Group     BP 06/15/16 1405 (!) 120/98     Pulse Rate 06/15/16 1405 97     Resp 06/15/16 1405 16     Temp 06/15/16 1405 97.9 F (36.6 C)     Temp Source 06/15/16 1405 Oral     SpO2 06/15/16 1405 96 %     Weight 06/15/16 1403 190 lb (86.2 kg)     Height 06/15/16 1403 '5\' 2"'$  (1.575 m)     Head Circumference --      Peak Flow --      Pain Score 06/15/16 1403 6    Constitutional: Alert and oriented. Well appearing and in no distress. Eyes: Conjunctivae are normal.  ENT   Head: Normocephalic and atraumatic.   Nose: No congestion/rhinnorhea.   Mouth/Throat: Mucous membranes are moist.   Neck: No stridor. Hematological/Lymphatic/Immunilogical: No cervical lymphadenopathy. Cardiovascular: Normal rate, regular rhythm.  No murmurs, rubs, or gallops.  Respiratory: Normal respiratory effort without tachypnea nor retractions. Breath sounds are clear and equal bilaterally. No wheezes/rales/rhonchi. Gastrointestinal: Soft and non tender. No rebound. No guarding.  Genitourinary: Deferred Musculoskeletal: Some tenderness to mid right foot with palpation and forced dorsiflexion. No erythema or swelling appreciated. DP 2+. Able to move all digits.  Neurologic:  Normal speech and language. No gross focal neurologic deficits are appreciated.  Skin:  Skin is warm, dry and intact. No rash noted. Psychiatric: Mood and affect are normal. Speech and behavior are normal. Patient exhibits appropriate insight and judgment.  ____________________________________________    LABS (pertinent positives/negatives)  None  ____________________________________________   EKG  None  ____________________________________________    RADIOLOGY  Right foot IMPRESSION: Chronic changes as above. No acute osseous abnormality identified.  I, Dalanie Kisner, personally viewed  and evaluated these images (plain radiographs) as part of my medical decision making. ____________________________________________   PROCEDURES  Procedures  ____________________________________________   INITIAL IMPRESSION / ASSESSMENT AND PLAN / ED COURSE  Pertinent labs & imaging results that were available during my care of the patient were reviewed by me and considered in my medical decision making (see chart for details).  Patient presents with mid right foot pain. X-rays without any acute osseous injury. This point unclear cause of the patient's symptoms. However given negative x-ray feel she is safe for outpatient follow-up. Will give podiatry information. Patient already has a appointment scheduled with primary care in 2 days.  ____________________________________________   FINAL CLINICAL IMPRESSION(S) / ED DIAGNOSES  Final diagnoses:  Foot pain, right     Note: This dictation was prepared with Dragon dictation. Any transcriptional errors that result from this process are unintentional     Nance Pear, MD 06/15/16 1550

## 2016-06-15 NOTE — Discharge Instructions (Signed)
Please seek medical attention for any high fevers, chest pain, shortness of breath, change in behavior, persistent vomiting, bloody stool or any other new or concerning symptoms.  

## 2016-06-15 NOTE — ED Triage Notes (Signed)
C/O right foot pain.  Pain starts at heal and radiates into bottom of foot. X 3 days.

## 2016-06-15 NOTE — ED Triage Notes (Signed)
Patient reports falling about 3 weeks ago with 3 rib fractures.  Patient has been walking more, per doctors order, to help prevent pneumonia.

## 2016-06-15 NOTE — Telephone Encounter (Signed)
Patient called and stated that she had fallen 3 weeks ago and cracked some ribs. She is now experiencing pain in her right foot with swelling. It is also hot to the touch. I spoke with our PA, [employer]. She is concerned that it may be a blood clot and advised the patient to go to the ER or Urgent Care. Patient decided to go to the ER.

## 2016-07-13 ENCOUNTER — Ambulatory Visit: Payer: Medicare Other | Admitting: Pulmonary Disease

## 2016-08-19 ENCOUNTER — Ambulatory Visit (INDEPENDENT_AMBULATORY_CARE_PROVIDER_SITE_OTHER): Payer: Medicare Other | Admitting: Pulmonary Disease

## 2016-08-19 ENCOUNTER — Encounter: Payer: Self-pay | Admitting: Pulmonary Disease

## 2016-08-19 VITALS — BP 130/68 | HR 64 | Ht 63.0 in | Wt 191.8 lb

## 2016-08-19 DIAGNOSIS — J432 Centrilobular emphysema: Secondary | ICD-10-CM

## 2016-08-19 DIAGNOSIS — R5383 Other fatigue: Secondary | ICD-10-CM | POA: Diagnosis not present

## 2016-08-19 DIAGNOSIS — G4733 Obstructive sleep apnea (adult) (pediatric): Secondary | ICD-10-CM | POA: Diagnosis not present

## 2016-08-19 MED ORDER — ALBUTEROL SULFATE HFA 108 (90 BASE) MCG/ACT IN AERS: 2.0000 | INHALATION_SPRAY | Freq: Four times a day (QID) | RESPIRATORY_TRACT | 3 refills | Status: DC | PRN

## 2016-08-19 NOTE — Patient Instructions (Signed)
For your obstructive sleep apnea: Continue using CPAP for now We will get a CPAP titration and then adjust her machine appropriately Exercise regularly and try to lose weight  For your COPD: Continue taking Advair as you're doing We will give a prescription for albuterol to use as needed for chest tightness, wheezing, shortness of breath  We will see you back in 3 months to see how you're doing after adjusting her CPAP machine

## 2016-08-19 NOTE — Progress Notes (Signed)
Subjective:    Patient ID: Nicole Arnold, female    DOB: 01-Jul-1928, 83 y.o.   MRN: [id number]  Synopsis: First saw [facility] pulmonary in 2015 for evaluation of mild pulmonary hypertension seen on an echocardiogram. Found to have evidence of COPD.    07/2013 PFT> Ratio 56% FEV1 1.56L (96%, 9% change), TLC 3.85L (85% pred), DLCO 9.8 (50% pred)  April 2015 CT chest reviewed by me> there is centrilobular emphysema more prominent in the bases, there is scarring in her left base. There is mild bronchiectasis in the right middle lobe as well as in the lingula. There was mild tree in bud abnormalities in the right upper lobe, there were multiple scattered pulmonary nodules about 5 mm in size  April 2015 echocardiogram> LVEF 60%. Left ventricle is normal in size. RV is normal in size and function but there was a comment of mild dilatation and the RVSP was estimated to be 59 mmHg   HPI Chief Complaint  Patient presents with  . Follow-up    pt states she is doing well, does note chest discomfort but attributes this to broken ribs.     Yamilka says that her breathing is not doing too well.  She was walking a neighbor's dog and it pulled her over and she broke a few ribs. She required tylenol for a while.  She gained weight during that time and hasn't exercised.  No wheezing, no cough, no chest congestion.  No chest pain or leg swelling.    She didn't have a flare up of her COPD in the last year, but she had some dyspnea it seems so her PCP started her on Advair twice a day.  She feels that it helps.   She is feeling more fatigued and sleepy. She takes a nap daily.  She sleeps with CPAP everything.  She keeps the machine clean.    Past Medical History:  Diagnosis Date  . Cataract   . Chronic constipation   . GERD (gastroesophageal reflux disease)   . Osteopenia   . Pancreatitis      Review of Systems  Constitutional: Positive for fatigue. Negative for chills and fever.  HENT:  Negative for nosebleeds, postnasal drip, rhinorrhea and sinus pressure.   Respiratory: Negative for cough, shortness of breath and wheezing.   Cardiovascular: Negative for chest pain, palpitations and leg swelling.  Musculoskeletal: Positive for arthralgias and joint swelling.       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 No lymphadenopathy in the abdomen or pelvis. - Multiple acute right-sided rib fractures. - Activity in small right pleural effusion, which is indeterminate, but may be related to right rib fractures. - Activity in normal-appearing bilateral hilar and mediastinal lymph nodes, likely benign.  Images from the 03/2015 CT chest reviewed> Small right pleural effusion, a few small nodules in the right lung, evidence of emphysema, no pulmonary embolism      Assessment & Plan:  OSA (obstructive sleep  apnea) - Plan: Ambulatory Referral for DME  Other fatigue - Plan: Cpap titration  Centrilobular emphysema ([place])   Discussion: Fiza has COPD with worsening symptoms in the last several months likely due to weight gain. She has not had an exacerbation. She is stable on Advair. She needs to try to exercise more and lose weight. However, an addition of this she tells me today that she's been having worsening fatigue lately and sleepiness during the daytime associated with weight gain. She's not sure if the CPAP machine is helping her as much as it used to. She says she's gained as much as  25 pounds. This concerns me that she may need more pressure from the CPAP machine.  Plan: For your obstructive sleep apnea: Continue using CPAP for now We will get a CPAP titration and then adjust her machine appropriately Exercise regularly and try to lose weight  For your COPD: Continue taking Advair as you're doing We will give a prescription for albuterol to use as needed for chest tightness, wheezing, shortness of breath  We will see you back in 3 months to see how you're doing after adjusting her CPAP machine   Updated Medication List Outpatient Encounter Prescriptions as of 08/19/2016  Medication Sig  . ADVAIR DISKUS 250-50 MCG/DOSE AEPB Inhale 1 puff into the lungs 2 (two) times daily.   Marland Kitchen ALPRAZolam (XANAX) 0.5 MG tablet Take 0.5 mg by mouth 3 (three) times daily as needed for anxiety.  Marland Kitchen amLODipine (NORVASC) 5 MG tablet Take 5 mg by mouth daily.   Marland Kitchen aspirin 81 MG tablet Take 81 mg by mouth daily.  . Calcium-Vitamin D (CALTRATE 600 PLUS-VIT D PO) Take 1 tablet by mouth daily.   . lansoprazole (PREVACID) 30 MG capsule Take 30 mg by mouth daily at 12 noon.  Marland Kitchen levothyroxine (SYNTHROID, LEVOTHROID) 50 MCG tablet Take 50 mcg by mouth daily before breakfast.  . Multiple Vitamins-Minerals (CENTRUM SILVER ADULT 50+ PO) Take 1 tablet by mouth daily.  Marland Kitchen PARoxetine (PAXIL) 30 MG tablet Take 30 mg by mouth daily.   . raloxifene (EVISTA) 60 MG tablet Take 60 mg by mouth daily.  . vitamin E 400 UNIT capsule Take 400 Units by mouth daily.  . [DISCONTINUED] acetaminophen (TYLENOL) 325 MG tablet Take 2 tablets (650 mg total) by mouth every 4 (four) hours as needed. (Patient not taking: Reported on 08/19/2016)   No facility-administered encounter medications on file as of 08/19/2016.

## 2016-08-20 ENCOUNTER — Ambulatory Visit: Payer: Medicare Other | Admitting: Pulmonary Disease

## 2016-08-24 ENCOUNTER — Ambulatory Visit (HOSPITAL_BASED_OUTPATIENT_CLINIC_OR_DEPARTMENT_OTHER): Payer: Medicare Other | Attending: Pulmonary Disease | Admitting: Pulmonary Disease

## 2016-08-24 VITALS — Ht 62.0 in | Wt 192.0 lb

## 2016-08-24 DIAGNOSIS — R5383 Other fatigue: Secondary | ICD-10-CM

## 2016-08-24 DIAGNOSIS — J449 Chronic obstructive pulmonary disease, unspecified: Secondary | ICD-10-CM | POA: Insufficient documentation

## 2016-08-24 DIAGNOSIS — G4733 Obstructive sleep apnea (adult) (pediatric): Secondary | ICD-10-CM | POA: Diagnosis present

## 2016-08-24 DIAGNOSIS — I493 Ventricular premature depolarization: Secondary | ICD-10-CM | POA: Diagnosis not present

## 2016-08-30 DIAGNOSIS — G4733 Obstructive sleep apnea (adult) (pediatric): Secondary | ICD-10-CM | POA: Diagnosis not present

## 2016-08-30 NOTE — Procedures (Signed)
    Patient Name: Nicole Arnold, Nicole Arnold Date: 08/24/2016   Gender: Female  D.O.B: Aug 14, 1928  Age (years): 46  Referring Provider: Simonne Maffucci   Height (inches): 85  Interpreting Physician: Chesley Mires MD, ABSM  Weight (lbs): 192  RPSGT: Madelon Lips   BMI: 46  MRN: [id number]  Neck Size: 14.00    CLINICAL INFORMATION  83 yo female with hx of COPD and OSA. She has been on CPAP therapy. She has recent weight gain and reports increased daytime fatigue. She presents for a CPAP titration study. Date of NPSG, Split Night or HST: 07/31/13, AHI 11.7, SpO2 low 88%. SLEEP STUDY TECHNIQUE  As per the AASM Manual for the Scoring of Sleep and Associated Events v2.3 (April 2016) with a hypopnea requiring 4% desaturations.  The channels recorded and monitored were frontal, central and occipital EEG, electrooculogram (EOG), submentalis EMG (chin), nasal and oral airflow, thoracic and abdominal wall motion, anterior tibialis EMG, snore microphone, electrocardiogram, and pulse oximetry. Continuous positive airway pressure (CPAP) was initiated at the beginning of the study and titrated to treat sleep-disordered breathing. MEDICATIONS  Medications self-administered by patient taken the night of the study : N/A  TECHNICIAN COMMENTS  Comments added by technician: BATHROOM BREAKS 1X. PT TOLERATED CPAP WITHOUT DIFFICULTY  Comments added by scorer: N/A  RESPIRATORY PARAMETERS  Optimal PAP Pressure (cm): 8 AHI at Optimal Pressure (/hr): 0.0  Overall Minimal O2 (%): 90.00 Supine % at Optimal Pressure (%): 0  Minimal O2 at Optimal Pressure (%): 90.0    SLEEP ARCHITECTURE  The study was initiated at 10:11:47 PM and ended at 4:48:01 AM.  Sleep onset time was 31.7 minutes and the sleep efficiency was 72.6%. The total sleep time was 287.5 minutes.  The patient spent 7.13% of the night in stage N1 sleep, 92.00% in stage N2 sleep, 0.87% in stage N3 and 0.00% in REM.Stage REM latency was N/A minutes  Wake  after sleep onset was 77.0. Alpha intrusion was absent. Supine sleep was 10.96%. CARDIAC DATA  The 2 lead EKG demonstrated sinus rhythm. The mean heart rate was 62.12 beats per minute. Other EKG findings include: PVCs.  LEG MOVEMENT DATA  The total Periodic Limb Movements of Sleep (PLMS) were 0. The PLMS index was 0.00. A PLMS index of <15 is considered normal in adults. IMPRESSIONS  - The optimal PAP pressure was 8 cm of water. She did not require supplemental oxygen during this titration study. Of note is that she did not achieve REM sleep during this study. DIAGNOSIS  - Obstructive Sleep Apnea (327.23 [G47.33 ICD-10]) RECOMMENDATIONS  - Trial of CPAP therapy on 8 cm H2O.  - She was fitted with a Medium size Resmed Full Face Mask AirFit F20 mask and heated humidification. [Electronically signed] 08/30/2016 03:54 AM Chesley Mires MD, ABSM  Diplomate, American Board of Sleep Medicine  NPI: [id number]

## 2016-08-31 ENCOUNTER — Telehealth: Payer: Self-pay

## 2016-08-31 DIAGNOSIS — G4733 Obstructive sleep apnea (adult) (pediatric): Secondary | ICD-10-CM

## 2016-08-31 NOTE — Telephone Encounter (Signed)
spokie with pt, aware of results/recs.  cpap ordered. Nothing further needed.

## 2016-09-30 ENCOUNTER — Ambulatory Visit (INDEPENDENT_AMBULATORY_CARE_PROVIDER_SITE_OTHER): Payer: Medicare Other | Admitting: Vascular Surgery

## 2016-09-30 ENCOUNTER — Encounter (INDEPENDENT_AMBULATORY_CARE_PROVIDER_SITE_OTHER): Payer: Self-pay | Admitting: Vascular Surgery

## 2016-09-30 VITALS — BP 135/64 | HR 73 | Resp 17 | Wt 192.6 lb

## 2016-09-30 DIAGNOSIS — J432 Centrilobular emphysema: Secondary | ICD-10-CM | POA: Diagnosis not present

## 2016-09-30 DIAGNOSIS — I872 Venous insufficiency (chronic) (peripheral): Secondary | ICD-10-CM | POA: Diagnosis not present

## 2016-09-30 DIAGNOSIS — I890 Lymphedema, not elsewhere classified: Secondary | ICD-10-CM | POA: Diagnosis not present

## 2016-09-30 DIAGNOSIS — G4733 Obstructive sleep apnea (adult) (pediatric): Secondary | ICD-10-CM

## 2016-10-03 NOTE — Progress Notes (Signed)
MRN : [id number]  Nicole Arnold is a 83 y.o. (1928-06-27) female who presents with chief complaint of  Chief Complaint  Patient presents with  . Follow-up    88month  .  History of Present Illness:  The patient returns to the office for followup evaluation regarding leg swelling.  The swelling has persisted but with the lymph pump the patient states the swelling is much better controlled. The pain associated with swelling is essentially eliminated. There have not been any interval development of a ulcerations or wounds.  No episodes of cellulitis or infection over the past 12 months  The patient denies problems with the pump, noting it is working well and the leggings are in good condition.  Since the previous visit the patient has been wearing graduated compression stockings and using the lymph pump on a routine basis and  has noted significant improvement in the lymphedema.   Patient stated the lymph pump has been a very positive factor in her care.        Current Meds  Medication Sig  . ADVAIR DISKUS 250-50 MCG/DOSE AEPB Inhale 1 puff into the lungs 2 (two) times daily.   Marland Kitchen albuterol (PROVENTIL HFA;VENTOLIN HFA) 108 (90 Base) MCG/ACT inhaler Inhale 2 puffs into the lungs every 6 (six) hours as needed for wheezing or shortness of breath.  . ALPRAZolam (XANAX) 0.5 MG tablet Take 0.5 mg by mouth 3 (three) times daily as needed for anxiety.  Marland Kitchen amLODipine (NORVASC) 5 MG tablet Take 5 mg by mouth daily.   Marland Kitchen aspirin 81 MG tablet Take 81 mg by mouth daily.  . Calcium-Vitamin D (CALTRATE 600 PLUS-VIT D PO) Take 1 tablet by mouth daily.   . lansoprazole (PREVACID) 30 MG capsule Take 30 mg by mouth daily at 12 noon.  Marland Kitchen levothyroxine (SYNTHROID, LEVOTHROID) 50 MCG tablet Take 50 mcg by mouth daily before breakfast.  . Multiple Vitamins-Minerals (CENTRUM SILVER ADULT 50+ PO) Take 1 tablet by mouth daily.  Marland Kitchen PARoxetine (PAXIL) 30 MG tablet Take 30 mg by mouth daily.   . raloxifene  (EVISTA) 60 MG tablet Take 60 mg by mouth daily.  . vitamin E 400 UNIT capsule Take 400 Units by mouth daily.    Past Medical History:  Diagnosis Date  . Cataract   . Chronic constipation   . GERD (gastroesophageal reflux disease)   . Osteopenia   . Pancreatitis     Past Surgical History:  Procedure Laterality Date  . APPENDECTOMY  1949  . CATARACT EXTRACTION Right 1987  . CATARACT EXTRACTION Left 1986  . detached retina repair Right 1991  . OOPHORECTOMY  1959   right ovary, part of left  . RETINAL DETACHMENT SURGERY Left 1997  . uterine hysterectomy  1978    Social History Social History  Substance Use Topics  . Smoking status: Former Smoker    Packs/day: 0.50    Years: 30.00    Types: Cigarettes    Quit date: 01/25/1993  . Smokeless tobacco: Never Used  . Alcohol use No    Family History Family History  Problem Relation Age of Onset  . Breast cancer Neg Hx     Allergies  Allergen Reactions  . Amoxicillin     Mouth/tongue/throat swelling  . Codeine     vomiting  . Neosporin [Neomycin-Bacitracin Zn-Polymyx]   . Sulfa Antibiotics      REVIEW OF SYSTEMS (Negative unless checked)  Constitutional: [] Weight loss  [] Fever  [] Chills Cardiac: [] Chest pain   []   Chest pressure   [] Palpitations   [] Shortness of breath when laying flat   [] Shortness of breath with exertion. Vascular:  [] Pain in legs with walking   [] Pain in legs at rest  [] History of DVT   [] Phlebitis   [x] Swelling in legs   [x] Varicose veins   [] Non-healing ulcers Pulmonary:   [] Uses home oxygen   [] Productive cough   [] Hemoptysis   [] Wheeze  [] COPD   [] Asthma Neurologic:  [] Dizziness   [] Seizures   [] History of stroke   [] History of TIA  [] Aphasia   [] Vissual changes   [] Weakness or numbness in arm   [] Weakness or numbness in leg Musculoskeletal:   [] Joint swelling   [x] Joint pain   [] Low back pain Hematologic:  [] Easy bruising  [] Easy bleeding   [] Hypercoagulable state   [] Anemic Gastrointestinal:   [] Diarrhea   [] Vomiting  [] Gastroesophageal reflux/heartburn   [] Difficulty swallowing. Genitourinary:  [] Chronic kidney disease   [] Difficult urination  [] Frequent urination   [] Blood in urine Skin:  [x] Rashes   [] Ulcers  Psychological:  [] History of anxiety   []  History of major depression.  Physical Examination  Vitals:   09/30/16 1328  BP: 135/64  Pulse: 73  Resp: 17  Weight: 87.4 kg (192 lb 9.6 oz)   Body mass index is 35.23 kg/m. Gen: WD/WN, NAD Head: [place]/AT, No temporalis wasting.  Ear/Nose/Throat: Hearing grossly intact, nares w/o erythema or drainage Eyes: PER, EOMI, sclera nonicteric.  Neck: Supple, no large masses.   Pulmonary:  Good air movement, no audible wheezing bilaterally, no use of accessory muscles.  Cardiac: RRR, no JVD Vascular: Scattered varicosities present extensively bilaterally.  Moderate venous stasis changes to the legs bilaterally.  2+ soft pitting edema Vessel Right Left  Radial Palpable Palpable  PT Palpable Palpable  DP Palpable Palpable  Gastrointestinal: Non-distended. No guarding/no peritoneal signs.  Musculoskeletal: M/S 5/5 throughout.  No deformity or atrophy.  Neurologic: CN 2-12 intact. Symmetrical.  Speech is fluent. Motor exam as listed above. Psychiatric: Judgment intact, Mood & affect appropriate for pt's clinical situation. Dermatologic: venous rashes no ulcers noted.  No changes consistent with cellulitis. Lymph : No lichenification or skin changes of chronic lymphedema.  CBC Lab Results  Component Value Date   WBC 8.8 05/28/2016   HGB 13.0 05/28/2016   HCT 38.1 05/28/2016   MCV 84.0 05/28/2016   PLT 273 05/28/2016    BMET    Component Value Date/Time   NA 129 (L) 05/28/2016 1714   K 4.4 05/28/2016 1714   CL 97 (L) 05/28/2016 1714   CO2 25 05/28/2016 1714   GLUCOSE 117 (H) 05/28/2016 1714   BUN 18 05/28/2016 1714   CREATININE 0.72 05/28/2016 1714   CALCIUM 8.6 (L) 05/28/2016 1714   GFRNONAA >60 05/28/2016 1714    GFRAA >60 05/28/2016 1714   CrCl cannot be calculated (Patient's most recent lab result is older than the maximum 21 days allowed.).  COAG Lab Results  Component Value Date   INR 1.13 05/28/2016    Radiology No results found.  Assessment/Plan 1. Chronic venous insufficiency No surgery or intervention at this point in time.    I have had a long discussion with the patient regarding venous insufficiency and why it  causes symptoms. I have discussed with the patient the chronic skin changes that accompany venous insufficiency and the long term sequela such as infection and ulceration.  Patient will begin wearing graduated compression stockings class 1 (20-30 mmHg) or compression wraps on a daily basis a  prescription was given. The patient will put the stockings on first thing in the morning and removing them in the evening. The patient is instructed specifically not to sleep in the stockings.    In addition, behavioral modification including several periods of elevation of the lower extremities during the day will be continued. I have demonstrated that proper elevation is a position with the ankles at heart level.  The patient is instructed to begin routine exercise, especially walking on a daily basis  Following the review of the ultrasound the patient will follow up in 2-3 months to reassess the degree of swelling and the control that graduated compression stockings or compression wraps  is offering.   The patient can be assessed for a Lymph Pump at that time  2. Lymphedema  No surgery or intervention at this point in time.    I have reviewed my discussion with the patient regarding lymphedema and why it  causes symptoms.  Patient will continue wearing graduated compression stockings class 1 (20-30 mmHg) on a daily basis a prescription was given. The patient is reminded to put the stockings on first thing in the morning and removing them in the evening. The patient is instructed  specifically not to sleep in the stockings.   In addition, behavioral modification throughout the day will be continued.  This will include frequent elevation (such as in a recliner), use of over the counter pain medications as needed and exercise such as walking.  I have reviewed systemic causes for chronic edema such as liver, kidney and cardiac etiologies and there does not appear to be any significant changes in these organ systems over the past year.  The patient is under the impression that these organ systems are all stable and unchanged.    The patient will continue aggressive use of the  lymph pump.  This will continue to improve the edema control and prevent sequela such as ulcers and infections.   The patient will follow-up with me on an annual basis.    3. Centrilobular emphysema ([place]) Continue pulmonary medications and aerosols as already ordered, these medications have been reviewed and there are no changes at this time.    4. OSA (obstructive sleep apnea) Continue CPAP as already ordered, these medications have been reviewed and there are no changes at this time.     Hortencia Pilar, MD  10/03/2016 2:41 PM

## 2016-10-04 ENCOUNTER — Encounter: Payer: Self-pay | Admitting: Pulmonary Disease

## 2016-11-18 ENCOUNTER — Other Ambulatory Visit: Payer: Self-pay | Admitting: Unknown Physician Specialty

## 2016-11-18 DIAGNOSIS — R0989 Other specified symptoms and signs involving the circulatory and respiratory systems: Secondary | ICD-10-CM

## 2016-11-18 DIAGNOSIS — F458 Other somatoform disorders: Secondary | ICD-10-CM

## 2016-11-19 ENCOUNTER — Ambulatory Visit
Admission: RE | Admit: 2016-11-19 | Discharge: 2016-11-19 | Disposition: A | Discharge status: Home / Self Care | Payer: Medicare Other | Source: Ambulatory Visit | Attending: Unknown Physician Specialty | Admitting: Unknown Physician Specialty

## 2016-11-19 DIAGNOSIS — F458 Other somatoform disorders: Secondary | ICD-10-CM | POA: Insufficient documentation

## 2016-11-19 DIAGNOSIS — R0989 Other specified symptoms and signs involving the circulatory and respiratory systems: Secondary | ICD-10-CM

## 2016-11-19 DIAGNOSIS — K449 Diaphragmatic hernia without obstruction or gangrene: Secondary | ICD-10-CM | POA: Insufficient documentation

## 2016-11-24 ENCOUNTER — Encounter: Payer: Self-pay | Admitting: Pulmonary Disease

## 2016-11-24 ENCOUNTER — Ambulatory Visit (INDEPENDENT_AMBULATORY_CARE_PROVIDER_SITE_OTHER): Payer: Medicare Other | Admitting: Pulmonary Disease

## 2016-11-24 VITALS — BP 136/76 | HR 88 | Ht 62.0 in | Wt 193.1 lb

## 2016-11-24 DIAGNOSIS — G4733 Obstructive sleep apnea (adult) (pediatric): Secondary | ICD-10-CM

## 2016-11-24 DIAGNOSIS — J432 Centrilobular emphysema: Secondary | ICD-10-CM | POA: Diagnosis not present

## 2016-11-24 MED ORDER — UMECLIDINIUM-VILANTEROL 62.5-25 MCG/INH IN AEPB: 1.0000 | INHALATION_SPRAY | Freq: Every day | RESPIRATORY_TRACT | 0 refills | Status: DC

## 2016-11-24 NOTE — Patient Instructions (Signed)
OSA: We will obtain a compliance report from your CPAP supplier We will make sure the pressure is adjusted to 8cm H20  COPD: Take Anoro one puff daily no matter how you feel Stop Advair We will see you back in 2 weeks to make sure things are getting better, nurse practitioner visit

## 2016-11-24 NOTE — Progress Notes (Signed)
Subjective:    Patient ID: Nicole Arnold, female    DOB: Oct 12, 1928, 83 y.o.   MRN: [id number]  Synopsis: First saw [facility] pulmonary in 2015 for evaluation of mild pulmonary hypertension seen on an echocardiogram. Found to have evidence of COPD.       HPI Chief Complaint  Patient presents with  . Follow-up    no longer using advair due to sore throat   Nicole Arnold says that no one has adjusted the pressure on her machine recently. She still uses her CPAP regulalry.  She says that no one adjusted her CPAP machine.   She says that her breathing is still not good.  She has not exercised or lost weight.    She stopped Advair because of soreness in her throat and tongue.  She tried to rinse her mouth but she still had trouble.     Past Medical History:  Diagnosis Date  . Cataract   . Chronic constipation   . GERD (gastroesophageal reflux disease)   . Osteopenia   . Pancreatitis      Review of Systems  Constitutional: Positive for fatigue. Negative for chills and fever.  HENT: Negative for nosebleeds, postnasal drip, rhinorrhea and sinus pressure.   Respiratory: Negative for cough, shortness of breath and wheezing.   Cardiovascular: Negative for chest pain, palpitations and leg swelling.  Musculoskeletal: Positive for arthralgias and joint swelling.       Objective:   Physical Exam Vitals:   11/24/16 1055  BP: 136/76  Pulse: 88  SpO2: 95%  Weight: 193 lb 2 oz (87.6 kg)  Height: 5\' 2"  (1.575 m)   Room air  Gen: well appearing HENT: OP clear, TM's clear, neck supple PULM: CTA B, normal percussion CV: RRR, no mgr, trace edema GI: BS+, soft, nontender Derm: no cyanosis or rash Psyche: normal mood and affect  PFT: 07/2013 PFT> Ratio 56% FEV1 1.56L (96%, 9% change), TLC 3.85L (85% pred), DLCO 9.8 (50% pred)   CPAP TITRATION: 07/2016 > titrated to 8cm H20  Echo: April 2015 echocardiogram> LVEF 60%. Left ventricle is normal in size. RV is normal in  size and function but there was a comment of mild dilatation and the RVSP was estimated to be 59 mmHg   Chest imaging April 2015 CT chest reviewed by me> there is centrilobular emphysema more prominent in the bases, there is scarring in her left base. There is mild bronchiectasis in the right middle lobe as well as in the lingula. 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 No lymphadenopathy in the abdomen or pelvis. - Multiple acute right-sided rib fractures. - Activity in small right pleural effusion, which is indeterminate, but may be related to right rib fractures. - Activity in normal-appearing bilateral hilar and mediastinal lymph nodes, likely benign. 03/2015 CT chest reviewed> Small right pleural effusion, a few small nodules in the right lung, evidence of emphysema, no pulmonary embolism  CBC    Component Value Date/Time   WBC 8.8 05/28/2016 1714   RBC 4.54 05/28/2016 1714   HGB 13.0 05/28/2016 1714   HCT 38.1 05/28/2016 1714   PLT 273 05/28/2016 1714   MCV 84.0 05/28/2016 1714   MCH 28.8 05/28/2016 1714   MCHC 34.2 05/28/2016 1714   RDW  13.1 05/28/2016 1714   LYMPHSABS 1.3 05/28/2016 1714   MONOABS 0.7 05/28/2016 1714   EOSABS 0.1 05/28/2016 1714   BASOSABS 0.0 05/28/2016 1714         Assessment & Plan:  Centrilobular emphysema (HCC)  OSA (obstructive sleep apnea)   Discussion: Nicole Arnold continues to struggle with shortness of breath.  In her last visit my plan was to adjust the pressure on her CPAP and have her take COPD medicines regularly and unfortunately neither of those have happened.  Were going to make sure that her advanced home care has changed her CPAP pressure to 8 cm of water and I am going to change  her to Anoro to see if she tolerates this better than the Advair.  I want her to come back and see 1 of our nurse practitioners in 2 weeks to see her breathing has improved.  If not then I think it is worthwhile to repeat lung function testing and chest imaging.  Plan: OSA: We will obtain a compliance report from your CPAP supplier We will make sure the pressure is adjusted to 8cm H20  COPD: Take Anoro one puff daily no matter how you feel Stop Advair We will see you back in 2 weeks to make sure things are getting better, nurse practitioner visit   Updated Medication List Outpatient Encounter Prescriptions as of 11/24/2016  Medication Sig  . albuterol (PROVENTIL HFA;VENTOLIN HFA) 108 (90 Base) MCG/ACT inhaler Inhale 2 puffs into the lungs every 6 (six) hours as needed for wheezing or shortness of breath.  . ALPRAZolam (XANAX) 0.5 MG tablet Take 0.5 mg by mouth 3 (three) times daily as needed for anxiety.  Marland Kitchen amLODipine (NORVASC) 5 MG tablet Take 5 mg by mouth daily.   Marland Kitchen aspirin 81 MG tablet Take 81 mg by mouth daily.  . Calcium-Vitamin D (CALTRATE 600 PLUS-VIT D PO) Take 1 tablet by mouth daily.   . lansoprazole (PREVACID) 30 MG capsule Take 30 mg by mouth daily at 12 noon.  Marland Kitchen levothyroxine (SYNTHROID, LEVOTHROID) 50 MCG tablet Take 50 mcg by mouth daily before breakfast.  . Multiple Vitamins-Minerals (CENTRUM SILVER ADULT 50+ PO) Take 1 tablet by mouth daily.  Marland Kitchen PARoxetine (PAXIL) 30 MG tablet Take 30 mg by mouth daily.   . raloxifene (EVISTA) 60 MG tablet Take 60 mg by mouth daily.  . vitamin E 400 UNIT capsule Take 400 Units by mouth daily.  Marland Kitchen ADVAIR DISKUS 250-50 MCG/DOSE AEPB Inhale 1 puff into the lungs 2 (two) times daily.    No facility-administered encounter medications on file as of 11/24/2016.

## 2016-11-30 ENCOUNTER — Telehealth: Payer: Self-pay | Admitting: Pulmonary Disease

## 2016-11-30 MED ORDER — UMECLIDINIUM-VILANTEROL 62.5-25 MCG/INH IN AEPB: 1.0000 | INHALATION_SPRAY | Freq: Every day | RESPIRATORY_TRACT | 5 refills | Status: DC

## 2016-11-30 NOTE — Telephone Encounter (Signed)
Spoke with pt. States at her last OV, BQ started her on Anoro. Reports that this inhaler is working well and she would like a prescription for it. Rx has been sent in. Nothing further was needed.

## 2016-12-02 ENCOUNTER — Telehealth: Payer: Self-pay | Admitting: Pulmonary Disease

## 2016-12-02 MED ORDER — UMECLIDINIUM-VILANTEROL 62.5-25 MCG/INH IN AEPB: 1.0000 | INHALATION_SPRAY | Freq: Every day | RESPIRATORY_TRACT | 3 refills | Status: AC

## 2016-12-02 NOTE — Telephone Encounter (Signed)
[facility] has [facility] per pt request for 90 day supply with 3 refills.  Spoke with Misty at [facility] and d/c Rx. Pt is aware and voiced her understanding. Nothing further needed.

## 2016-12-08 ENCOUNTER — Encounter: Payer: Self-pay | Admitting: Pulmonary Disease

## 2016-12-09 ENCOUNTER — Ambulatory Visit: Payer: Medicare Other | Admitting: Adult Health

## 2016-12-13 ENCOUNTER — Encounter: Payer: Self-pay | Admitting: Adult Health

## 2016-12-13 ENCOUNTER — Ambulatory Visit (INDEPENDENT_AMBULATORY_CARE_PROVIDER_SITE_OTHER): Payer: Medicare Other | Admitting: Adult Health

## 2016-12-13 DIAGNOSIS — J439 Emphysema, unspecified: Secondary | ICD-10-CM | POA: Diagnosis not present

## 2016-12-13 DIAGNOSIS — G4733 Obstructive sleep apnea (adult) (pediatric): Secondary | ICD-10-CM | POA: Diagnosis not present

## 2016-12-13 NOTE — Progress Notes (Signed)
Reviewed, agree 

## 2016-12-13 NOTE — Addendum Note (Signed)
Addended by: Parke Poisson E on: 12/13/2016 12:41 PM   Modules accepted: Orders

## 2016-12-13 NOTE — Assessment & Plan Note (Signed)
Doing well on CPAP .  Download requested.  Supplies ordered   Plan  Patient Instructions  Continue on ANORO 1 puff daily . -Rinse after use.  Continue on CPAP At bedtime   CPAP download .  Work on healthy weight .  CPAP supplies order sent .  Do not drive if sleepy .  Follow up with Dr. Lake Bells in 3-4 months and As needed   Please contact office for sooner follow up if symptoms do not improve or worsen or seek emergency care

## 2016-12-13 NOTE — Progress Notes (Signed)
@Patient  ID: Nicole Arnold, female    DOB: 09/08/1928, 83 y.o.   MRN: [id number]  Chief Complaint  Patient presents with  . Follow-up    COPD     Referring provider: Idelle Crouch, MD  HPI:  83 year old female followed for COPD, obstructive sleep apnea on CPAP, mild pulmonary hypertension  TEST  PFT: 07/2013 PFT> Ratio 56% FEV1 1.56L (96%, 9% change), TLC 3.85L (85% pred), DLCO 9.8 (50% pred)   CPAP TITRATION: 07/2016 > titrated to 8cm H20  Echo: April 2015 echocardiogram> LVEF 60%. Left ventricle is normal in size. RV is normal in size and function but there was a comment of mild dilatation and the RVSP was estimated to be 59 mmHg   Chest imaging April 2015 CT chest reviewed by me> there is centrilobular emphysema more prominent in the bases, there is scarring in her left base. There is mild bronchiectasis in the right middle lobe as well as in the lingula. There was mild tree in bud abnormalities in the right upper lobe, there were multiple scattered pulmonary nodules about 5 mm in size  PET CT from 03/2015 [facility] IMPRESSION: - Hypermetabolic pancreatic mass, incompletely characterized on this noncontrasted study, but 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 No lymphadenopathy in the abdomen or pelvis. - Multiple acute right-sided rib fractures. - Activity in small right pleural effusion, which is indeterminate, but may be related to right rib fractures. - Activity in normal-appearing bilateral hilar and mediastinal lymph nodes, likely benign. 03/2015 CT chest reviewed> Small right pleural effusion, a few small nodules in the right lung, evidence of emphysema, no pulmonary embolism   12/13/2016 Follow up : COPD , OSA , Mild Pulmonary HTN  Patient presents for a one-month follow-up.  Patient was seen last visit and changed from Advair to [facility] .  She was having soreness in her tongue and throat.  This seems to be improved.  Says breathing is stable without flare of cough  or wheezing.Breathing is stable without flare of cough or wheezing .   Patient has sleep apnea.  She is on CPAP at bedtime.  Patient says she feels rested.  Download was requested. Wears it each ~8-9 hr .  Needs supplies .    Allergies  Allergen Reactions  . Amoxicillin     Mouth/tongue/throat swelling  . Codeine     vomiting  . Neosporin [Neomycin-Bacitracin Zn-Polymyx]   . Sulfa Antibiotics     Immunization History  Administered Date(s) Administered  . Influenza Split 12/08/2013  . Influenza, High Dose Seasonal PF 11/23/2012, 11/12/2016  . Influenza, Seasonal, Injecte, Preservative Fre 10/28/2014  . Pneumococcal-Unspecified 05/30/2006    Past Medical History:  Diagnosis Date  . Cataract   . Chronic constipation   . GERD (gastroesophageal reflux disease)   . Osteopenia   . Pancreatitis     Tobacco History: Social History   Tobacco Use  Smoking Status Former Smoker  . Packs/day: 0.50  . Years: 30.00  . Pack years: 15.00  . Types: Cigarettes  . Last attempt to quit: 01/25/1993  . Years since quitting: 23.8  Smokeless Tobacco Never Used   Counseling given: Not Answered   Outpatient Encounter Medications as of 12/13/2016  Medication Sig  . albuterol (PROVENTIL HFA;VENTOLIN HFA) 108 (90 Base) MCG/ACT inhaler Inhale 2 puffs into the lungs every 6 (six) hours as needed for wheezing or shortness of breath.  . ALPRAZolam (XANAX) 0.5 MG tablet Take 0.5 mg by mouth 3 (three) times daily as needed  for anxiety.  Marland Kitchen amLODipine (NORVASC) 5 MG tablet Take 5 mg by mouth daily.   Marland Kitchen aspirin 81 MG tablet Take 81 mg by mouth daily.  . Calcium-Vitamin D (CALTRATE 600 PLUS-VIT D PO) Take 1 tablet by mouth daily.   . lansoprazole (PREVACID) 30 MG capsule Take 30 mg by mouth daily at 12 noon.  Marland Kitchen levothyroxine (SYNTHROID, LEVOTHROID) 50 MCG tablet Take 50 mcg by mouth daily before breakfast.  . Multiple Vitamins-Minerals (CENTRUM SILVER ADULT 50+ PO) Take 1 tablet by mouth daily.  Marland Kitchen  PARoxetine (PAXIL) 30 MG tablet Take 30 mg by mouth daily.   . raloxifene (EVISTA) 60 MG tablet Take 60 mg by mouth daily.  Marland Kitchen umeclidinium-vilanterol (ANORO ELLIPTA) 62.5-25 MCG/INH AEPB Inhale 1 puff daily into the lungs.  . vitamin E 400 UNIT capsule Take 400 Units by mouth daily.  . [DISCONTINUED] ADVAIR DISKUS 250-50 MCG/DOSE AEPB Inhale 1 puff into the lungs 2 (two) times daily.    No facility-administered encounter medications on file as of 12/13/2016.      Review of Systems  Constitutional:   No  weight loss, night sweats,  Fevers, chills, + fatigue, or  lassitude.  HEENT:   No headaches,  Difficulty swallowing,  Tooth/dental problems, or  Sore throat,                No sneezing, itching, ear ache, nasal congestion, post nasal drip,   CV:  No chest pain,  Orthopnea, PND, swelling in lower extremities, anasarca, dizziness, palpitations, syncope.   GI  No heartburn, indigestion, abdominal pain, nausea, vomiting, diarrhea, change in bowel habits, loss of appetite, bloody stools.   Resp:    No chest wall deformity  Skin: no rash or lesions.  GU: no dysuria, change in color of urine, no urgency or frequency.  No flank pain, no hematuria   MS:  No joint pain or swelling.  No decreased range of motion.  No back pain.    Physical Exam  BP 112/68 (BP Location: Left Arm, Cuff Size: Normal)   Pulse 60   Ht 5\' 2"  (1.575 m)   Wt 192 lb 9.6 oz (87.4 kg)   SpO2 97%   BMI 35.23 kg/m   GEN: A/Ox3; pleasant , NAD, elderly    HEENT:  [place]/AT,  EACs-clear, TMs-wnl, NOSE-clear, THROAT-clear, no lesions, no postnasal drip or exudate noted.   NECK:  Supple w/ fair ROM; no JVD; normal carotid impulses w/o bruits; no thyromegaly or nodules palpated; no lymphadenopathy.    RESP  Clear  P & A; w/o, wheezes/ rales/ or rhonchi. no accessory muscle use, no dullness to percussion  CARD:  RRR, no m/r/g, tr peripheral edema, pulses intact, no cyanosis or clubbing.  GI:   Soft & nt; nml bowel  sounds; no organomegaly or masses detected.   Musco: Warm bil, no deformities or joint swelling noted.   Neuro: alert, no focal deficits noted.    Skin: Warm, no lesions or rashes    Lab Results:   BNP No results found for: BNP  ProBNP No results found for: PROBNP  Imaging:    Assessment & Plan:   OSA (obstructive sleep apnea) Doing well on CPAP .  Download requested.  Supplies ordered   Plan  Patient Instructions  Continue on ANORO 1 puff daily . -Rinse after use.  Continue on CPAP At bedtime   CPAP download .  Work on healthy weight .  CPAP supplies order sent .  Do not  drive if sleepy .  Follow up with Dr. Lake Bells in 3-4 months and As needed   Please contact office for sooner follow up if symptoms do not improve or worsen or seek emergency care      Emphysema lung [facility]) Improved sx control and tolerance on [facility]   Plan  Cont on ANORO daily .      Rexene Edison, NP 12/13/2016

## 2016-12-13 NOTE — Assessment & Plan Note (Signed)
Improved sx control and tolerance on ANORO   Plan  Cont on ANORO daily .

## 2016-12-13 NOTE — Patient Instructions (Signed)
Continue on ANORO 1 puff daily . -Rinse after use.  Continue on CPAP At bedtime   CPAP download .  Work on healthy weight .  CPAP supplies order sent .  Do not drive if sleepy .  Follow up with Dr. Lake Bells in 3-4 months and As needed   Please contact office for sooner follow up if symptoms do not improve or worsen or seek emergency care

## 2016-12-30 ENCOUNTER — Telehealth: Payer: Self-pay | Admitting: Pulmonary Disease

## 2016-12-30 NOTE — Telephone Encounter (Signed)
Spoke with pt, she states she was told to bring her chip for a download twice but has not gotten an update. I called to get another download faxed to me so I can have BQ review to make sure patient is on the correct pressure.

## 2016-12-30 NOTE — Telephone Encounter (Signed)
Pt is returning call from earlier today requesting results from home sleep study.

## 2016-12-30 NOTE — Telephone Encounter (Signed)
Spoke with pt's husband, she was not home but he will have her call us back when she gets back.

## 2016-12-30 NOTE — Telephone Encounter (Signed)
BQ her download is in your look at folder.

## 2016-12-30 NOTE — Telephone Encounter (Signed)
Still awaiting download.

## 2017-01-06 NOTE — Telephone Encounter (Signed)
BQ I just wanted to make sure this message was routed to you. I apologize if this was sent already,. Thanks

## 2017-01-12 NOTE — Telephone Encounter (Signed)
BQ please advise if this has been addressed. Thanks.

## 2017-01-13 NOTE — Telephone Encounter (Signed)
lmtcb x1 for pt. 

## 2017-01-13 NOTE — Telephone Encounter (Signed)
Reviewed, she scored 100%.  A+

## 2017-01-13 NOTE — Telephone Encounter (Signed)
lmtcb X2 for pt.  

## 2017-01-14 NOTE — Telephone Encounter (Signed)
Pt is returning call. Cb is [phone].

## 2017-01-14 NOTE — Telephone Encounter (Signed)
Spoke with pt, aware of results.  Nothing further needed.

## 2017-01-14 NOTE — Telephone Encounter (Signed)
ATC pt, no answer. Left message for pt to call back.  

## 2017-02-04 ENCOUNTER — Other Ambulatory Visit: Payer: Self-pay | Admitting: Internal Medicine

## 2017-02-04 DIAGNOSIS — Z1231 Encounter for screening mammogram for malignant neoplasm of breast: Secondary | ICD-10-CM

## 2017-03-09 ENCOUNTER — Ambulatory Visit
Admission: RE | Admit: 2017-03-09 | Discharge: 2017-03-09 | Disposition: A | Discharge status: Home / Self Care | Payer: Medicare Other | Source: Ambulatory Visit | Attending: Internal Medicine | Admitting: Internal Medicine

## 2017-03-09 ENCOUNTER — Encounter: Payer: Self-pay | Admitting: Internal Medicine

## 2017-03-09 DIAGNOSIS — Z1231 Encounter for screening mammogram for malignant neoplasm of breast: Secondary | ICD-10-CM | POA: Diagnosis not present

## 2017-06-14 ENCOUNTER — Other Ambulatory Visit: Payer: Self-pay | Admitting: Student

## 2017-06-14 DIAGNOSIS — G9519 Other vascular myelopathies: Secondary | ICD-10-CM

## 2017-06-14 DIAGNOSIS — M48062 Spinal stenosis, lumbar region with neurogenic claudication: Secondary | ICD-10-CM

## 2017-06-27 ENCOUNTER — Ambulatory Visit: Payer: Medicare Other

## 2017-07-05 ENCOUNTER — Emergency Department: Payer: Medicare Other

## 2017-07-05 ENCOUNTER — Other Ambulatory Visit: Payer: Self-pay

## 2017-07-05 ENCOUNTER — Encounter: Payer: Self-pay | Admitting: Emergency Medicine

## 2017-07-05 ENCOUNTER — Inpatient Hospital Stay
Admission: EM | Admit: 2017-07-05 | Discharge: 2017-07-08 | DRG: 181 | Disposition: A | Discharge status: Skilled Nursing Facility | Payer: Medicare Other | Attending: Specialist | Admitting: Specialist

## 2017-07-05 DIAGNOSIS — Z79890 Hormone replacement therapy (postmenopausal): Secondary | ICD-10-CM

## 2017-07-05 DIAGNOSIS — R0603 Acute respiratory distress: Secondary | ICD-10-CM | POA: Diagnosis present

## 2017-07-05 DIAGNOSIS — Z7982 Long term (current) use of aspirin: Secondary | ICD-10-CM | POA: Diagnosis not present

## 2017-07-05 DIAGNOSIS — Z885 Allergy status to narcotic agent status: Secondary | ICD-10-CM | POA: Diagnosis not present

## 2017-07-05 DIAGNOSIS — R0602 Shortness of breath: Secondary | ICD-10-CM | POA: Diagnosis not present

## 2017-07-05 DIAGNOSIS — R918 Other nonspecific abnormal finding of lung field: Secondary | ICD-10-CM | POA: Diagnosis not present

## 2017-07-05 DIAGNOSIS — Z87891 Personal history of nicotine dependence: Secondary | ICD-10-CM

## 2017-07-05 DIAGNOSIS — K219 Gastro-esophageal reflux disease without esophagitis: Secondary | ICD-10-CM | POA: Diagnosis not present

## 2017-07-05 DIAGNOSIS — E039 Hypothyroidism, unspecified: Secondary | ICD-10-CM | POA: Diagnosis present

## 2017-07-05 DIAGNOSIS — C3411 Malignant neoplasm of upper lobe, right bronchus or lung: Principal | ICD-10-CM | POA: Diagnosis present

## 2017-07-05 DIAGNOSIS — J90 Pleural effusion, not elsewhere classified: Secondary | ICD-10-CM

## 2017-07-05 DIAGNOSIS — Z90721 Acquired absence of ovaries, unilateral: Secondary | ICD-10-CM

## 2017-07-05 DIAGNOSIS — J449 Chronic obstructive pulmonary disease, unspecified: Secondary | ICD-10-CM | POA: Diagnosis present

## 2017-07-05 DIAGNOSIS — Z880 Allergy status to penicillin: Secondary | ICD-10-CM | POA: Diagnosis not present

## 2017-07-05 DIAGNOSIS — Z882 Allergy status to sulfonamides status: Secondary | ICD-10-CM

## 2017-07-05 DIAGNOSIS — R51 Headache: Secondary | ICD-10-CM | POA: Diagnosis not present

## 2017-07-05 DIAGNOSIS — Z9841 Cataract extraction status, right eye: Secondary | ICD-10-CM

## 2017-07-05 DIAGNOSIS — M8580 Other specified disorders of bone density and structure, unspecified site: Secondary | ICD-10-CM | POA: Diagnosis present

## 2017-07-05 DIAGNOSIS — I10 Essential (primary) hypertension: Secondary | ICD-10-CM | POA: Diagnosis present

## 2017-07-05 DIAGNOSIS — Z9842 Cataract extraction status, left eye: Secondary | ICD-10-CM

## 2017-07-05 DIAGNOSIS — Z9889 Other specified postprocedural states: Secondary | ICD-10-CM

## 2017-07-05 DIAGNOSIS — R590 Localized enlarged lymph nodes: Secondary | ICD-10-CM | POA: Diagnosis present

## 2017-07-05 DIAGNOSIS — Z90710 Acquired absence of both cervix and uterus: Secondary | ICD-10-CM

## 2017-07-05 DIAGNOSIS — G4733 Obstructive sleep apnea (adult) (pediatric): Secondary | ICD-10-CM | POA: Diagnosis present

## 2017-07-05 DIAGNOSIS — K859 Acute pancreatitis without necrosis or infection, unspecified: Secondary | ICD-10-CM | POA: Diagnosis not present

## 2017-07-05 DIAGNOSIS — K861 Other chronic pancreatitis: Secondary | ICD-10-CM | POA: Diagnosis present

## 2017-07-05 DIAGNOSIS — K5900 Constipation, unspecified: Secondary | ICD-10-CM | POA: Diagnosis not present

## 2017-07-05 DIAGNOSIS — R599 Enlarged lymph nodes, unspecified: Secondary | ICD-10-CM | POA: Diagnosis not present

## 2017-07-05 DIAGNOSIS — Z881 Allergy status to other antibiotic agents status: Secondary | ICD-10-CM

## 2017-07-05 DIAGNOSIS — Z79899 Other long term (current) drug therapy: Secondary | ICD-10-CM | POA: Diagnosis not present

## 2017-07-05 DIAGNOSIS — F419 Anxiety disorder, unspecified: Secondary | ICD-10-CM | POA: Diagnosis present

## 2017-07-05 LAB — URINALYSIS, COMPLETE (UACMP) WITH MICROSCOPIC
BILIRUBIN URINE: NEGATIVE
Bacteria, UA: NONE SEEN
Glucose, UA: NEGATIVE mg/dL
HGB URINE DIPSTICK: NEGATIVE
Ketones, ur: NEGATIVE mg/dL
Leukocytes, UA: NEGATIVE
NITRITE: NEGATIVE
Protein, ur: NEGATIVE mg/dL
SPECIFIC GRAVITY, URINE: 1.018 (ref 1.005–1.030)
pH: 6 (ref 5.0–8.0)

## 2017-07-05 LAB — BASIC METABOLIC PANEL
Anion gap: 8 (ref 5–15)
BUN: 21 mg/dL — ABNORMAL HIGH (ref 6–20)
CHLORIDE: 102 mmol/L (ref 101–111)
CO2: 24 mmol/L (ref 22–32)
Calcium: 8.6 mg/dL — ABNORMAL LOW (ref 8.9–10.3)
Creatinine, Ser: 0.86 mg/dL (ref 0.44–1.00)
GFR calc Af Amer: >60 mL/min (ref >60)
GFR calc non Af Amer: 59 mL/min — ABNORMAL LOW (ref >60)
Glucose, Bld: 105 mg/dL — ABNORMAL HIGH (ref 65–99)
Potassium: 4.5 mmol/L (ref 3.5–5.1)
SODIUM: 134 mmol/L — AB (ref 135–145)

## 2017-07-05 LAB — CBC
HEMATOCRIT: 33.1 % — AB (ref 35.0–47.0)
Hemoglobin: 11.2 g/dL — ABNORMAL LOW (ref 12.0–16.0)
MCH: 28.2 pg (ref 26.0–34.0)
MCHC: 33.8 g/dL (ref 32.0–36.0)
MCV: 83.3 fL (ref 80.0–100.0)
PLATELETS: 410 10*3/uL (ref 150–440)
RBC: 3.98 MIL/uL (ref 3.80–5.20)
RDW: 13.2 % (ref 11.5–14.5)
WBC: 8.3 10*3/uL (ref 3.6–11.0)

## 2017-07-05 LAB — TROPONIN I: Troponin I: <0.03 ng/mL (ref <0.03)

## 2017-07-05 LAB — TSH: TSH: 3.786 u[IU]/mL (ref 0.350–4.500)

## 2017-07-05 MED ORDER — ACETAMINOPHEN 325 MG PO TABS
650.0000 mg | ORAL_TABLET | Freq: Four times a day (QID) | ORAL | Status: DC | PRN
  Administered 2017-07-06: 13:00:00 650 mg via ORAL
  Filled 2017-07-05: qty 2

## 2017-07-05 MED ORDER — IRBESARTAN 150 MG PO TABS
75.0000 mg | ORAL_TABLET | Freq: Every day | ORAL | Status: DC
  Administered 2017-07-05 – 2017-07-08 (×3): 75 mg via ORAL
  Filled 2017-07-05 (×3): qty 1

## 2017-07-05 MED ORDER — PAROXETINE HCL 30 MG PO TABS
30.0000 mg | ORAL_TABLET | Freq: Every day | ORAL | Status: DC
  Administered 2017-07-05 – 2017-07-08 (×4): 30 mg via ORAL
  Filled 2017-07-05 (×4): qty 1

## 2017-07-05 MED ORDER — RALOXIFENE HCL 60 MG PO TABS
60.0000 mg | ORAL_TABLET | Freq: Every day | ORAL | Status: DC
  Administered 2017-07-05 – 2017-07-08 (×3): 60 mg via ORAL
  Filled 2017-07-05 (×4): qty 1

## 2017-07-05 MED ORDER — PANTOPRAZOLE SODIUM 40 MG PO TBEC
40.0000 mg | DELAYED_RELEASE_TABLET | Freq: Every day | ORAL | Status: DC
  Administered 2017-07-06 – 2017-07-08 (×3): 40 mg via ORAL
  Filled 2017-07-05 (×3): qty 1

## 2017-07-05 MED ORDER — AMLODIPINE BESYLATE 5 MG PO TABS
5.0000 mg | ORAL_TABLET | Freq: Every day | ORAL | Status: DC
  Administered 2017-07-05 – 2017-07-08 (×3): 5 mg via ORAL
  Filled 2017-07-05 (×3): qty 1

## 2017-07-05 MED ORDER — ENOXAPARIN SODIUM 40 MG/0.4ML ~~LOC~~ SOLN
40.0000 mg | SUBCUTANEOUS | Status: DC
  Filled 2017-07-05: qty 0.4

## 2017-07-05 MED ORDER — VITAMIN E 180 MG (400 UNIT) PO CAPS
400.0000 [IU] | ORAL_CAPSULE | Freq: Every day | ORAL | Status: DC
  Administered 2017-07-05 – 2017-07-08 (×3): 400 [IU] via ORAL
  Filled 2017-07-05 (×4): qty 1

## 2017-07-05 MED ORDER — ALPRAZOLAM 0.5 MG PO TABS
0.5000 mg | ORAL_TABLET | Freq: Three times a day (TID) | ORAL | Status: DC | PRN
  Administered 2017-07-06 – 2017-07-08 (×4): 0.5 mg via ORAL
  Filled 2017-07-05 (×5): qty 1

## 2017-07-05 MED ORDER — DOCUSATE SODIUM 100 MG PO CAPS
100.0000 mg | ORAL_CAPSULE | Freq: Two times a day (BID) | ORAL | Status: DC
  Administered 2017-07-06 – 2017-07-07 (×3): 100 mg via ORAL
  Filled 2017-07-05 (×4): qty 1

## 2017-07-05 MED ORDER — ACETAMINOPHEN 650 MG RE SUPP: 650.0000 mg | Freq: Four times a day (QID) | RECTAL | Status: DC | PRN

## 2017-07-05 MED ORDER — ADULT MULTIVITAMIN W/MINERALS CH
1.0000 | ORAL_TABLET | Freq: Every day | ORAL | Status: DC
  Administered 2017-07-05 – 2017-07-08 (×3): 1 via ORAL
  Filled 2017-07-05 (×3): qty 1

## 2017-07-05 MED ORDER — ONDANSETRON HCL 4 MG/2ML IJ SOLN: 4.0000 mg | Freq: Four times a day (QID) | INTRAMUSCULAR | Status: DC | PRN

## 2017-07-05 MED ORDER — IPRATROPIUM-ALBUTEROL 0.5-2.5 (3) MG/3ML IN SOLN
3.0000 mL | Freq: Once | RESPIRATORY_TRACT | Status: AC
  Administered 2017-07-05: 3 mL via RESPIRATORY_TRACT
  Filled 2017-07-05: qty 3

## 2017-07-05 MED ORDER — ASPIRIN EC 81 MG PO TBEC
81.0000 mg | DELAYED_RELEASE_TABLET | Freq: Every day | ORAL | Status: DC
  Administered 2017-07-06: 81 mg via ORAL
  Filled 2017-07-05: qty 1

## 2017-07-05 MED ORDER — LEVOTHYROXINE SODIUM 50 MCG PO TABS
50.0000 ug | ORAL_TABLET | Freq: Every day | ORAL | Status: DC
Start: 2017-07-06 — End: 2017-07-08
  Administered 2017-07-06 – 2017-07-08 (×2): 50 ug via ORAL
  Filled 2017-07-05 (×3): qty 1

## 2017-07-05 MED ORDER — IOHEXOL 300 MG/ML  SOLN
75.0000 mL | Freq: Once | INTRAMUSCULAR | Status: AC | PRN
  Administered 2017-07-05: 75 mL via INTRAVENOUS

## 2017-07-05 MED ORDER — UMECLIDINIUM-VILANTEROL 62.5-25 MCG/INH IN AEPB
1.0000 | INHALATION_SPRAY | Freq: Every day | RESPIRATORY_TRACT | Status: DC
  Administered 2017-07-06 – 2017-07-08 (×3): 1 via RESPIRATORY_TRACT
  Filled 2017-07-05: qty 14

## 2017-07-05 MED ORDER — ONDANSETRON HCL 4 MG PO TABS
4.0000 mg | ORAL_TABLET | Freq: Four times a day (QID) | ORAL | Status: DC | PRN
Start: 2017-07-05 — End: 2017-07-08

## 2017-07-05 NOTE — ED Notes (Signed)
Pt began ambulating without difficulty. Pt denies SOB for the first couple minutes but then lost her breath and was unable to speak in complete sentences. Pt became tachycardic and her oxygen saturation dropped to 89%. MD made aware.

## 2017-07-05 NOTE — ED Provider Notes (Signed)
[facility] Emergency Department Provider Note  ____________________________________________  Time seen: Approximately 2:42 PM  I have reviewed the triage vital signs and the nursing notes.   HISTORY  Chief Complaint Shortness of Breath   HPI Nicole Arnold is a 84 y.o. female with a history of COPD, OSA, left-sided pneumonectomy presents for evaluation of shortness of breath.  Patient reports several days of progressively worsening shortness of breath.  Today the shortness of breath was markedly worse after leaving [facility].  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  As she was driving home she decided to go to urgent care due to the severity of her shortness of breath.  She felt dizzy and almost passed out.  According to the family her health has been declining for the last several weeks.  She has had decreased appetite, generalized weakness, and decreased level of energy.  Patient is currently going through a lot of different stresses.  She is currently in the process of packing to move to a smaller apartment.  Her husband has been hospitalized for several weeks.  Her son and daughter are helping her pack.  At the urgent care she was found to have a right-sided pleural effusion and was sent to the emergency room for evaluation.  She does have a history of chronic cough and reports that she believes it is mildly worse over the last few days.  No fever or chills, no chest pain but she does report mild chest soreness bilaterally which has been constant since yesterday.  No nausea, vomiting, diarrhea, abdominal pain.  Patient denies history of heart disease.  She is a former smoker.  Past Medical History:  Diagnosis Date  . Cataract   . Chronic constipation   . GERD (gastroesophageal reflux disease)   . Osteopenia   . Pancreatitis     Patient Active Problem List   Diagnosis Date Noted  . Lymphedema 04/03/2016  . Chronic venous insufficiency  04/03/2016  . Pulmonary nodules 07/07/2015  . Pain of finger of left hand 01/07/2014  . OSA (obstructive sleep apnea) 09/07/2013  . Obesity, unspecified 09/07/2013  . Dyspnea 07/24/2013  . Emphysema lung ([place]) 07/24/2013  . Pulmonary hypertension ([place]) 07/24/2013  . Fatigue 07/24/2013    Past Surgical History:  Procedure Laterality Date  . APPENDECTOMY  1949  . CATARACT EXTRACTION Right 1987  . CATARACT EXTRACTION Left 1986  . detached retina repair Right 1991  . OOPHORECTOMY  1959   right ovary, part of left  . RETINAL DETACHMENT SURGERY Left 1997  . uterine hysterectomy  1978    Prior to Admission medications   Medication Sig Start Date End Date Taking? Authorizing Provider  ALPRAZolam Duanne Moron) 0.5 MG tablet Take 0.5 mg by mouth 3 (three) times daily as needed for anxiety.   Yes [provider]  amLODipine (NORVASC) 5 MG tablet Take 5 mg by mouth daily.    Yes [provider]  aspirin 81 MG tablet Take 81 mg by mouth daily.   Yes [provider]  Calcium-Vitamin D (CALTRATE 600 PLUS-VIT D PO) Take 1 tablet by mouth daily.    Yes [provider]  lansoprazole (PREVACID) 30 MG capsule Take 30 mg by mouth daily at 12 noon.   Yes [provider]  levothyroxine (SYNTHROID, LEVOTHROID) 50 MCG tablet Take 50 mcg by mouth daily before breakfast.   Yes [provider]  MICARDIS 40 MG tablet Take 40 mg by mouth  daily. 07/04/17  Yes [provider]  Multiple Vitamins-Minerals (CENTRUM SILVER ADULT 50+ PO) Take 1 tablet by mouth daily.   Yes [provider]  PARoxetine (PAXIL) 30 MG tablet Take 30 mg by mouth daily.    Yes [provider]  raloxifene (EVISTA) 60 MG tablet Take 60 mg by mouth daily.   Yes [provider]  umeclidinium-vilanterol (ANORO ELLIPTA) 62.5-25 MCG/INH AEPB Inhale 1 puff daily into the lungs. 12/02/16  Yes Juanito Doom, MD  vitamin E 400 UNIT capsule Take 400 Units by mouth  daily.   Yes [provider]  albuterol (PROVENTIL HFA;VENTOLIN HFA) 108 (90 Base) MCG/ACT inhaler Inhale 2 puffs into the lungs every 6 (six) hours as needed for wheezing or shortness of breath. Patient not taking: Reported on 07/05/2017 08/19/16   Juanito Doom, MD    Allergies Amoxicillin; Codeine; Neosporin [neomycin-bacitracin zn-polymyx]; and Sulfa antibiotics  Family History  Problem Relation Age of Onset  . Breast cancer Neg Hx     Social History Social History   Tobacco Use  . Smoking status: Former Smoker    Packs/day: 0.50    Years: 30.00    Pack years: 15.00    Types: Cigarettes    Last attempt to quit: 01/25/1993    Years since quitting: 24.4  . Smokeless tobacco: Never Used  Substance Use Topics  . Alcohol use: No  . Drug use: No    Review of Systems  Constitutional: Negative for fever. + Generalized weakness and fatigue Eyes: Negative for visual changes. ENT: Negative for sore throat. Neck: No neck pain  Cardiovascular: Negative for chest pain. Respiratory: + shortness of breath. Gastrointestinal: Negative for abdominal pain, vomiting or diarrhea. + Decreased appetite Genitourinary: Negative for dysuria. Musculoskeletal: Negative for back pain. Skin: Negative for rash. Neurological: Negative for headaches, weakness or numbness. Psych: No SI or HI  ____________________________________________   PHYSICAL EXAM:  VITAL SIGNS: ED Triage Vitals  Enc Vitals Group     BP 07/05/17 1315 (!) 151/50     Pulse Rate 07/05/17 1315 66     Resp 07/05/17 1315 18     Temp 07/05/17 1315 99 F (37.2 C)     Temp Source 07/05/17 1315 Oral     SpO2 07/05/17 1315 96 %     Weight 07/05/17 1315 175 lb (79.4 kg)     Height 07/05/17 1315 5\' 3"  (1.6 m)     Head Circumference --      Peak Flow --      Pain Score 07/05/17 1320 0     Pain Loc --      Pain Edu? --      Excl. in [place]? --     Constitutional: Alert and oriented. Well appearing and in no  apparent distress. HEENT:      Head: Normocephalic and atraumatic.         Eyes: Conjunctivae are normal. Sclera is non-icteric.       Mouth/Throat: Mucous membranes are moist.       Neck: Supple with no signs of meningismus. Cardiovascular: Regular rate and rhythm. No murmurs, gallops, or rubs. 2+ symmetrical distal pulses are present in all extremities. No JVD. Respiratory: Normal respiratory effort. Lungs are clear to auscultation bilaterally. No wheezes, crackles, or rhonchi.  Gastrointestinal: Soft, non tender, and non distended with positive bowel sounds. No rebound or guarding. Musculoskeletal: Nontender with normal range of motion in all extremities. No edema, cyanosis, or erythema of extremities. Neurologic:  Normal speech and language. Face is symmetric. Moving all extremities. No gross focal neurologic deficits are appreciated. Skin: Skin is warm, dry and intact. No rash noted. Psychiatric: Mood and affect are normal. Speech and behavior are normal.  ____________________________________________   LABS (all labs ordered are listed, but only abnormal results are displayed)  Labs Reviewed  BASIC METABOLIC PANEL - Abnormal; Notable for the following components:      Result Value   Sodium 134 (*)    Glucose, Bld 105 (*)    BUN 21 (*)    Calcium 8.6 (*)    GFR calc non Af Amer 59 (*)    All other components within normal limits  CBC - Abnormal; Notable for the following components:   Hemoglobin 11.2 (*)    HCT 33.1 (*)    All other components within normal limits  TROPONIN I  TSH  URINALYSIS, COMPLETE (UACMP) WITH MICROSCOPIC   ____________________________________________  EKG  ED ECG REPORT I, Rudene Re, the attending physician, personally viewed and interpreted this ECG.  Normal sinus rhythm, rate of 64, normal intervals, left axis deviation, low QRS, no ST elevations or depressions.  Unchanged from prior from  2008 ____________________________________________  RADIOLOGY  I have personally reviewed the images performed during this visit and I agree with the Radiologist's read.   Interpretation by Radiologist:  Dg Chest 2 View  Result Date: 07/05/2017 CLINICAL DATA:  Shortness of breath today. Malaise for 1 month. Former smoker. EXAM: CHEST - 2 VIEW COMPARISON:  Chest radiographs 05/28/2016. Chest CT 04/01/2015. Esophagram 11/19/2016. FINDINGS: The heart size and mediastinal contours are stable. There is aortic atherosclerosis. Right subpulmonic pleural effusion has enlarged compared with the prior radiographs. Peripheral mid right lung density on the frontal examination is new and could reflect extension of fluid into the fissure. This projects over the aortic arch on the lateral view. There is mildly increased right basilar pulmonary opacity. The left lung is clear. Multiple healed rib fractures are present on the right. Left thoracotomy defect and probable remote rib fractures are also noted. IMPRESSION: Compared with 2018 radiographs, there are new pleuroparenchymal opacities within the right hemithorax which may be related to the previously demonstrated right chest wall injury and rib fractures. Other considerations include pneumonia and neoplasm. Further evaluation with chest CT recommended. Electronically Signed   By: Richardean Sale M.D.   On: 07/05/2017 14:41     ____________________________________________   PROCEDURES  Procedure(s) performed: None Procedures Critical Care performed:  None ____________________________________________   INITIAL IMPRESSION / ASSESSMENT AND PLAN / ED COURSE  84 y.o. female with a history of COPD, OSA, left-sided pneumonectomy presents for evaluation of shortness of breath, generalized weakness, fatigue, decreased appetite for the last few weeks worse since yesterday.  Patient is well-appearing, no distress, she has normal vital signs with a low-grade temp  of 41F, lungs are clear to auscultation bilaterally, no pitting edema of her lower extremities.  EKG with no evidence of ischemia or dysrhythmias.  Chest x-ray concerning for right-sided pleural effusion with lung changes concerning for pneumonia versus neoplasm.  A CT will be done for further evaluation.  Labs showed no evidence of anemia, dehydration, electrolyte abnormalities. Troponin negative.     _________________________ 3:40 PM on 07/05/2017 -----------------------------------------  CT done and pending radiologist's read. There is a R sided infiltrate or mass seen on CT. Care transferred to Dr. Archie Balboa.   As part of my medical decision making, I reviewed the following data within the electronic medical  record:  History obtained from family, Nursing notes reviewed and incorporated, Labs reviewed , EKG interpreted , Old chart reviewed, Radiograph reviewed  Notes from prior ED visits and [place] Controlled Substance Database    Pertinent labs & imaging results that were available during my care of the patient were reviewed by me and considered in my medical decision making (see chart for details).    ____________________________________________   FINAL CLINICAL IMPRESSION(S) / ED DIAGNOSES  Final diagnoses:  None      NEW MEDICATIONS STARTED DURING THIS VISIT:  ED Discharge Orders    None       Note:  This document was prepared using Dragon voice recognition software and may include unintentional dictation errors.    Alfred Levins, [place], MD 07/06/17 1300

## 2017-07-05 NOTE — H&P (Signed)
[facility] NAME: Nicole Arnold    MR#:  [id number]  DATE OF BIRTH:  Jan 14, 1929  DATE OF ADMISSION:  07/05/2017  PRIMARY CARE PHYSICIAN: Idelle Crouch, MD   REQUESTING/REFERRING PHYSICIAN: Archie Balboa  CHIEF COMPLAINT:   Shortness of breath HISTORY OF PRESENT ILLNESS:  Nicole Arnold  is a 84 y.o. female with a known history of chronic pancreatitis, essential hypertension, hypothyroidism is presenting to the ED with a chief complaint of worsening of shortness of breath.  Patient is reporting that while resting she is comfortable but with minimal exertion she is very short of breath.  CT chest has revealed a right upper lobe lung mass with mediastinal lymphadenopathy and large pleural effusion Hospitalist team is called to admit the patient.  During my examination patient is resting comfortably.  Daughter Investment banker, corporate )and son-in-law(pathologist) are at bedside  PAST MEDICAL HISTORY:   Past Medical History:  Diagnosis Date  . Cataract   . Chronic constipation   . GERD (gastroesophageal reflux disease)   . Osteopenia   . Pancreatitis     PAST SURGICAL HISTOIRY:   Past Surgical History:  Procedure Laterality Date  . APPENDECTOMY  1949  . CATARACT EXTRACTION Right 1987  . CATARACT EXTRACTION Left 1986  . detached retina repair Right 1991  . OOPHORECTOMY  1959   right ovary, part of left  . RETINAL DETACHMENT SURGERY Left 1997  . uterine hysterectomy  1978    SOCIAL HISTORY:   Social History   Tobacco Use  . Smoking status: Former Smoker    Packs/day: 0.50    Years: 30.00    Pack years: 15.00    Types: Cigarettes    Last attempt to quit: 01/25/1993    Years since quitting: 24.4  . Smokeless tobacco: Never Used  Substance Use Topics  . Alcohol use: No    FAMILY HISTORY:   Family History  Problem Relation Age of Onset  . Breast cancer Neg Hx     DRUG ALLERGIES:   Allergies  Allergen Reactions  .  Amoxicillin     Has patient had a PCN reaction causing immediate rash, facial/tongue/throat swelling, SOB or lightheadedness with hypotension: Yes Has patient had a PCN reaction causing severe rash involving mucus membranes or skin necrosis: No Has patient had a PCN reaction that required hospitalization: Unknown Has patient had a PCN reaction occurring within the last 10 years: Unknown If all of the above answers are "NO", then may proceed with Cephalosporin use.   . Codeine     vomiting  . Neosporin [Neomycin-Bacitracin Zn-Polymyx]   . Sulfa Antibiotics Rash    REVIEW OF SYSTEMS:  CONSTITUTIONAL: No fever, fatigue or weakness.  EYES: No blurred or double vision.  EARS, NOSE, AND THROAT: No tinnitus or ear pain.  RESPIRATORY: No cough, shortness of breath, wheezing or hemoptysis.  Right lateral chest pain with movements CARDIOVASCULAR: No chest pain, orthopnea, edema.  GASTROINTESTINAL: No nausea, vomiting, diarrhea or abdominal pain.  GENITOURINARY: No dysuria, hematuria.  ENDOCRINE: No polyuria, nocturia,  HEMATOLOGY: No anemia, easy bruising or bleeding SKIN: No rash or lesion. MUSCULOSKELETAL: No joint pain or arthritis.   NEUROLOGIC: No tingling, numbness, weakness.  PSYCHIATRY: No anxiety or depression.   MEDICATIONS AT HOME:   Prior to Admission medications   Medication Sig Start Date End Date Taking? Authorizing Provider  ALPRAZolam Duanne Moron) 0.5 MG tablet Take 0.5 mg by mouth 3 (three) times daily as needed for anxiety.  Yes [provider]  amLODipine (NORVASC) 5 MG tablet Take 5 mg by mouth daily.    Yes [provider]  aspirin 81 MG tablet Take 81 mg by mouth daily.   Yes [provider]  Calcium-Vitamin D (CALTRATE 600 PLUS-VIT D PO) Take 1 tablet by mouth daily.    Yes [provider]  lansoprazole (PREVACID) 30 MG capsule Take 30 mg by mouth daily at 12 noon.   Yes [provider]  levothyroxine (SYNTHROID, LEVOTHROID)  50 MCG tablet Take 50 mcg by mouth daily before breakfast.   Yes [provider]  MICARDIS 40 MG tablet Take 40 mg by mouth daily. 07/04/17  Yes [provider]  Multiple Vitamins-Minerals (CENTRUM SILVER ADULT 50+ PO) Take 1 tablet by mouth daily.   Yes [provider]  PARoxetine (PAXIL) 30 MG tablet Take 30 mg by mouth daily.    Yes [provider]  raloxifene (EVISTA) 60 MG tablet Take 60 mg by mouth daily.   Yes [provider]  umeclidinium-vilanterol (ANORO ELLIPTA) 62.5-25 MCG/INH AEPB Inhale 1 puff daily into the lungs. 12/02/16  Yes Juanito Doom, MD  vitamin E 400 UNIT capsule Take 400 Units by mouth daily.   Yes [provider]  albuterol (PROVENTIL HFA;VENTOLIN HFA) 108 (90 Base) MCG/ACT inhaler Inhale 2 puffs into the lungs every 6 (six) hours as needed for wheezing or shortness of breath. Patient not taking: Reported on 07/05/2017 08/19/16   Juanito Doom, MD      VITAL SIGNS:  Blood pressure (!) 150/62, pulse (!) 110, temperature 99 F (37.2 C), temperature source Oral, resp. rate 18, height 5\' 3"  (1.6 m), weight 79.4 kg (175 lb), SpO2 (!) 89 %.  PHYSICAL EXAMINATION:  Right mid Chest wall tenderness on palpation GENERAL:  85 y.o.-year-old patient lying in the bed with no acute distress.  EYES: Pupils equal, round, reactive to light and accommodation. No scleral icterus. Extraocular muscles intact.  HEENT: Head atraumatic, normocephalic. Oropharynx and nasopharynx clear.  NECK:  Supple, no jugular venous distention. No thyroid enlargement, no tenderness.  LUNGS: Normal breath sounds bilaterally, no wheezing, rales,rhonchi or crepitation. No use of accessory muscles of respiration.  Right-sided midaxillary chest wall tenderness on palpation CARDIOVASCULAR: S1, S2 normal. No murmurs, rubs, or gallops.  ABDOMEN: Soft, nontender, nondistended. Bowel sounds present EXTREMITIES: No pedal edema, cyanosis, or clubbing.   NEUROLOGIC: Cranial nerves II through XII are intact. Muscle strength 5/5 in all extremities. Sensation intact. Gait not checked.  PSYCHIATRIC: The patient is alert and oriented x 3.  SKIN: No obvious rash, lesion, or ulcer.   LABORATORY PANEL:   CBC Recent Labs  Lab 07/05/17 1329  WBC 8.3  HGB 11.2*  HCT 33.1*  PLT 410   ------------------------------------------------------------------------------------------------------------------  Chemistries  Recent Labs  Lab 07/05/17 1329  NA 134*  K 4.5  CL 102  CO2 24  GLUCOSE 105*  BUN 21*  CREATININE 0.86  CALCIUM 8.6*   ------------------------------------------------------------------------------------------------------------------  Cardiac Enzymes Recent Labs  Lab 07/05/17 1329  TROPONINI <0.03   ------------------------------------------------------------------------------------------------------------------  RADIOLOGY:  Dg Chest 2 View  Result Date: 07/05/2017 CLINICAL DATA:  Shortness of breath today. Malaise for 1 month. Former smoker. EXAM: CHEST - 2 VIEW COMPARISON:  Chest radiographs 05/28/2016. Chest CT 04/01/2015. Esophagram 11/19/2016. FINDINGS: The heart size and mediastinal contours are stable. There is aortic atherosclerosis. Right subpulmonic pleural effusion has enlarged compared with the prior radiographs. Peripheral mid right lung density on the frontal examination  is new and could reflect extension of fluid into the fissure. This projects over the aortic arch on the lateral view. There is mildly increased right basilar pulmonary opacity. The left lung is clear. Multiple healed rib fractures are present on the right. Left thoracotomy defect and probable remote rib fractures are also noted. IMPRESSION: Compared with 2018 radiographs, there are new pleuroparenchymal opacities within the right hemithorax which may be related to the previously demonstrated right chest wall injury and rib fractures. Other  considerations include pneumonia and neoplasm. Further evaluation with chest CT recommended. Electronically Signed   By: Richardean Sale M.D.   On: 07/05/2017 14:41   Ct Chest W Contrast  Result Date: 07/05/2017 CLINICAL DATA:  Shortness of breath and abnormal chest x-ray. Known pancreatic mass. EXAM: CT CHEST WITH CONTRAST TECHNIQUE: Multidetector CT imaging of the chest was performed during intravenous contrast administration. CONTRAST:  48mL OMNIPAQUE IOHEXOL 300 MG/ML  SOLN COMPARISON:  Chest x-ray 07/05/2017 and chest CT 04/01/2015 FINDINGS: Cardiovascular: The heart is normal in size. No pericardial effusion. The aorta is normal in caliber. Scattered atherosclerotic calcifications. No dissection. The branch vessels are patent. Three-vessel coronary artery calcifications are noted. Mediastinum/Nodes: Mediastinal and hilar adenopathy. Index right hilar node on image number 63 measures 14 mm. 8.5 mm precarinal lymph node on image number 54. 10 mm subcarinal lymph node on image number 67. 9.5 mm left infrahilar lymph node on image number 71. 8 mm epicardial lymph node on image number 98. Lungs/Pleura: Large right upper lobe lung mass measures 4.9 x 3.1 x 3.6 cm. This is most consistent with a primary lung neoplasm. 8.5 mm subpleural nodular density in the left upper lobe on image number 20 is unchanged since 2017 and likely benign. Spiculated 11 mm right upper lobe lesion on image number 69 could be a metastatic focus or a second primary. 7 mm pulmonary nodule in the right upper lobe adjacent to the major fissure on image number 70 suspicious for metastasis. Moderate to large right pleural effusion, likely malignant. No obvious enhancing pleural nodules. Upper Abdomen: There is a large complex cystic and solid mass projecting off of the upper aspect of the pancreatic body and into the lesser sac. Measures approximately 9.2 x 8.5 cm. It was also present on a prior CT scan from 2015 where it measured 5.5 x 5.1  cm. Scattered small surrounding lymph nodes are noted. Stable low-attenuation lesion in the left hepatic lobe, consistent with benign hepatic cysts. The gallbladder is contracted. Musculoskeletal: No significant bony findings. No obvious metastatic bone disease. IMPRESSION: 1. Large right upper lobe lung mass along with smaller right lung lesions as detailed above. These could be satellite lesions, metastasis or synchronous cancers. Metastasis is also possible given the large pancreatic mass but I think that is much less likely. 2. Mediastinal and hilar lymphadenopathy. 3. Moderate to large right pleural effusion, likely pathologic. 4. 9.2 x 8.5 cm pancreatic mass, enlarged since prior studies. 5. PET-CT may be helpful for further evaluation and staging purposes. Aortic Atherosclerosis (ICD10-I70.0) and Emphysema (ICD10-J43.9). Electronically Signed   By: Marijo Sanes M.D.   On: 07/05/2017 15:49    EKG:   Orders placed or performed during the hospital encounter of 07/05/17  . EKG 12-Lead  . EKG 12-Lead  . ED EKG  . ED EKG    IMPRESSION AND PLAN:  Nicole Arnold  is a 84 y.o. female with a known history of chronic pancreatitis, essential hypertension, hypothyroidism is presenting to the ED with  a chief complaint of worsening of shortness of breath.  Patient is reporting that while resting she is comfortable but with minimal exertion she is very short of breath.  CT chest has revealed a right upper lobe lung mass with mediastinal lymphadenopathy and large pleural effusion    #Acute respiratory distress secondary to lung mass with large right-sided pleural effusion Admit to MedSurg unit N.p.o. after midnight for thoracentesis, labs ordered Oxygen via nasal cannula Bronchodilator treatment as needed  #Right upper lobe lung mass with a large pleural effusion with mediastinal lymphadenopathy on CT scan Thoracentesis is ordered tomorrow N.p.o. after midnight and oncology consult  placed  #Pancreatic mass with past medical history of pancreatic pseudocyst Oncology consult placed for further evaluation  #Essential hypertension continue amlodipine, Micardis  #Hypothyroidism continue Synthroid TSH is normal  All the records are reviewed and case discussed with ED provider. Management plans discussed with the patient, family and they are in agreement.  CODE STATUS: fc   TOTAL TIME TAKING CARE OF THIS PATIENT: 43 minutes.   Note: This dictation was prepared with Dragon dictation along with smaller phrase technology. Any transcriptional errors that result from this process are unintentional.  Nicholes Mango M.D on 07/05/2017 at 7:30 PM  Between 7am to 6pm - Pager - [phone]  After 6pm go to [email or web address] - password [email or web address] [facility]  Office  [phone]  CC: Primary care physician; Idelle Crouch, MD

## 2017-07-05 NOTE — ED Provider Notes (Signed)
Patient's CT did return and is concerning for right lung mass.  I did discuss this with the patient.  We did try to ambulate the patient after another breathing treatment however she did desat on ambulation became quite tachypneic.  Will plan on admission to the hospital service.  Discussed plan with patient and family.   Nance Pear, MD 07/05/17 [id number]

## 2017-07-05 NOTE — ED Notes (Signed)
Pt assisted from wc to bed and placed on cardiac monitor.

## 2017-07-05 NOTE — ED Notes (Signed)
RN called lab to update on added TSH. Lab reports they will add the test on and has an adequate amount of blood.

## 2017-07-05 NOTE — ED Triage Notes (Addendum)
Pt arrived via POV with reports of shortness of breath worsening over the past week, pt states she was doing errands this morning and it was difficult for her to get back to the car she was short of breath.  Pt was seen at [facility] and XR done already.  Pt has right pleural effusion.  Pt has hx of left pneumonectomy as well.

## 2017-07-05 NOTE — ED Notes (Signed)
Admitting MD at bedside.

## 2017-07-05 NOTE — Progress Notes (Signed)
Family Meeting Note  Advance Directive:yes  Today a meeting took place with the Patient , daughter and son-in-law at bedside   The following clinical team members were present during this meeting:MD  The following were discussed:Patient's diagnosis: Shortness of breath with large right-sided pleural effusion, right upper lobe lung mass, pancreatic mass, mediastinal lymphadenopathy and other comorbidities including obstructive sleep apnea, chronic venous disease and treatment plan of care discussed in detail with the patient and family members at bedside.  They are agreeable with the treatment plan of care   patient's progosis: Unable to determine and Goals for treatment: Full Code  Healthcare POA daughter Seth Bake and son-in-law Rich Reining  Additional follow-up to be provided: Hospitalist and oncology  Time spent during discussion:18 min  Nicholes Mango, MD

## 2017-07-06 ENCOUNTER — Inpatient Hospital Stay: Payer: Medicare Other

## 2017-07-06 ENCOUNTER — Other Ambulatory Visit: Payer: Self-pay | Admitting: Oncology

## 2017-07-06 DIAGNOSIS — Z87891 Personal history of nicotine dependence: Secondary | ICD-10-CM

## 2017-07-06 DIAGNOSIS — K219 Gastro-esophageal reflux disease without esophagitis: Secondary | ICD-10-CM

## 2017-07-06 DIAGNOSIS — K859 Acute pancreatitis without necrosis or infection, unspecified: Secondary | ICD-10-CM

## 2017-07-06 DIAGNOSIS — Z79899 Other long term (current) drug therapy: Secondary | ICD-10-CM

## 2017-07-06 DIAGNOSIS — M8580 Other specified disorders of bone density and structure, unspecified site: Secondary | ICD-10-CM

## 2017-07-06 DIAGNOSIS — R0602 Shortness of breath: Secondary | ICD-10-CM

## 2017-07-06 DIAGNOSIS — R918 Other nonspecific abnormal finding of lung field: Secondary | ICD-10-CM

## 2017-07-06 DIAGNOSIS — C3411 Malignant neoplasm of upper lobe, right bronchus or lung: Secondary | ICD-10-CM

## 2017-07-06 DIAGNOSIS — K5900 Constipation, unspecified: Secondary | ICD-10-CM

## 2017-07-06 DIAGNOSIS — R599 Enlarged lymph nodes, unspecified: Secondary | ICD-10-CM

## 2017-07-06 DIAGNOSIS — R51 Headache: Secondary | ICD-10-CM

## 2017-07-06 LAB — COMPREHENSIVE METABOLIC PANEL
ALBUMIN: 2.9 g/dL — AB (ref 3.5–5.0)
ALK PHOS: 44 U/L (ref 38–126)
ALT: 12 U/L — ABNORMAL LOW (ref 14–54)
AST: 17 U/L (ref 15–41)
Anion gap: 8 (ref 5–15)
BUN: 19 mg/dL (ref 6–20)
CALCIUM: 8.2 mg/dL — AB (ref 8.9–10.3)
CHLORIDE: 103 mmol/L (ref 101–111)
CO2: 22 mmol/L (ref 22–32)
CREATININE: 0.84 mg/dL (ref 0.44–1.00)
GFR calc Af Amer: >60 mL/min (ref >60)
GFR calc non Af Amer: >60 mL/min (ref >60)
GLUCOSE: 94 mg/dL (ref 65–99)
Potassium: 4.4 mmol/L (ref 3.5–5.1)
SODIUM: 133 mmol/L — AB (ref 135–145)
Total Bilirubin: 0.4 mg/dL (ref 0.3–1.2)
Total Protein: 6.4 g/dL — ABNORMAL LOW (ref 6.5–8.1)

## 2017-07-06 LAB — BODY FLUID CELL COUNT WITH DIFFERENTIAL
LYMPHS FL: 45 %
MONOCYTE-MACROPHAGE-SEROUS FLUID: 5 %
NEUTROPHIL FLUID: 50 %
WBC FLUID: 2534 uL

## 2017-07-06 LAB — GLUCOSE, PLEURAL OR PERITONEAL FLUID: GLUCOSE FL: 81 mg/dL

## 2017-07-06 LAB — PROTEIN, PLEURAL OR PERITONEAL FLUID: Total protein, fluid: 4.1 g/dL

## 2017-07-06 LAB — CBC
HCT: 30.4 % — ABNORMAL LOW (ref 35.0–47.0)
HEMOGLOBIN: 10.5 g/dL — AB (ref 12.0–16.0)
MCH: 28.3 pg (ref 26.0–34.0)
MCHC: 34.5 g/dL (ref 32.0–36.0)
MCV: 82.2 fL (ref 80.0–100.0)
PLATELETS: 376 10*3/uL (ref 150–440)
RBC: 3.7 MIL/uL — AB (ref 3.80–5.20)
RDW: 12.8 % (ref 11.5–14.5)
WBC: 8.7 10*3/uL (ref 3.6–11.0)

## 2017-07-06 LAB — PROTIME-INR
INR: 1.18
Prothrombin Time: 14.9 seconds (ref 11.4–15.2)

## 2017-07-06 LAB — AMYLASE, PLEURAL OR PERITONEAL FLUID: Amylase, Fluid: 92 U/L

## 2017-07-06 MED ORDER — GADOBENATE DIMEGLUMINE 529 MG/ML IV SOLN
15.0000 mL | Freq: Once | INTRAVENOUS | Status: AC | PRN
  Administered 2017-07-06: 15:00:00 15 mL via INTRAVENOUS

## 2017-07-06 MED ORDER — PREMIER PROTEIN SHAKE
11.0000 [oz_av] | Freq: Two times a day (BID) | ORAL | Status: DC
  Administered 2017-07-06 – 2017-07-08 (×2): 11 [oz_av] via ORAL

## 2017-07-06 NOTE — Progress Notes (Signed)
Initial Nutrition Assessment  DOCUMENTATION CODES:   Obesity unspecified  INTERVENTION:  Provide Premier Protein po BID, each supplement provides 160 kcal and 30 grams of protein.  Continue daily MVI.  Encouraged adequate intake of calories and protein at meals. Discussed other non-meat protein options since patient does not like to eat meat.  NUTRITION DIAGNOSIS:   Increased nutrient needs related to catabolic illness(RUL mass with mediastinal lymphadenopathy suspicious for malignancy) as evidenced by estimated needs.  GOAL:   Patient will meet greater than or equal to 90% of their needs  MONITOR:   PO intake, Supplement acceptance, Labs, Weight trends, I & O's  REASON FOR ASSESSMENT:   Malnutrition Screening Tool    ASSESSMENT:   84 year old female with PMHx of OP, GERD, cataracts, hx of pancreatitis, chronic constipation, hypothyroidism, HTN who presented with worsening SOB found to have RUL lung mass with mediastinal lymphadenopathy and large pleural effusion on CT chest 6/11 now s/p US guided thoracentesis 6/12 with 1 L pleural fluid removed, also with pancreatis mass and hx of pancreatic pseudocyst.   -Pending oncology consult.  Met with patient, her daughter, and her son-in-law at bedside. Patient reports she has had a decreased appetite for the past 3-4 weeks. She reports her decreased appetite is related to how SOB she has been, but also related to some stress at home. The patient has been packing to move to an apartment, her husband is hospitalized, and her daughter is scheduled for surgery tomorrow. She does not like to eat meat so she typically has eggs or yogurt as her protein source at meals. Lately she has not been finishing much at meals (<25%) but her appetite has started picking up some today already after her thoracentesis. She was able to eat half of her breakfast, which included a bagel with cream cheese, eggs, oatmeal, and orange juice. She has been working  on ways to increase her protein intake and is amenable to trying some Premier Protein. She denies any food allergies or intolerances.  Patient feels she has been losing weight because her clothes, shoes, and partial dentures are all not fitting as well. Per chart her UBW was likely around 190-193 lbs. She was 192.6 lbs on 12/13/2016. If weight in chart is correct she has lost 17.6 lbs (9.1% body weight) over the past 7 months, which is significant for time frame. However, current weight could just be a stated weight and RD was unable to obtain weight at visit today.  Meal Completion: 50% of breakfast this AM  Medications reviewed and include: Colace, levothyroxine, MVI daily, pantoprazole, vitamin E 400 units daily.  Patient is at risk for development of malnutrition.  Labs reviewed: Sodium 133.  NUTRITION - FOCUSED PHYSICAL EXAM:    Most Recent Value  Orbital Region  No depletion  Upper Arm Region  Mild depletion  Thoracic and Lumbar Region  No depletion  Buccal Region  No depletion  Temple Region  Mild depletion  Clavicle Bone Region  No depletion  Clavicle and Acromion Bone Region  No depletion  Scapular Bone Region  No depletion  Dorsal Hand  No depletion  Patellar Region  No depletion  Anterior Thigh Region  No depletion  Posterior Calf Region  No depletion  Edema (RD Assessment)  Mild  Hair  Reviewed  Eyes  Reviewed  Mouth  Reviewed  Skin  Reviewed  Nails  Reviewed     Diet Order:   Diet Order  Diet 2 gram sodium Room service appropriate? Yes; Fluid consistency: Thin  Diet effective now          EDUCATION NEEDS:   Education needs have been addressed  Skin:  Skin Assessment: Skin Integrity Issues: Skin Integrity Issues:: Incisions Incisions: closed incision to right back  Last BM:  07/06/2017  Height:   Ht Readings from Last 1 Encounters:  07/05/17 _0  (1.6 m)    Weight:   Wt Readings from Last 1 Encounters:  07/05/17 175 lb (79.4 kg)     Ideal Body Weight:  52.3 kg  BMI:  Body mass index is 31 kg/m.  Estimated Nutritional Needs:   Kcal:  1570-1815 (MSJ x 1.3-1.5)  Protein:  80-100 grams (1-1.3 grams/kg)  Fluid:  1.5-1.8 L/day (1 mL/kcal)  Willey Blade, MS, RD, LDN Office: [phone] Pager: [phone] After Hours/Weekend Pager: [phone]

## 2017-07-06 NOTE — Procedures (Signed)
RT pleural eff  S/p 1 L Rt thoracentesis  No comp Stable Labs sent Full report in pacs cxr pending

## 2017-07-06 NOTE — Consult Note (Signed)
[facility]  [phone] [phone]  ID: Nicole Arnold OB: 1928/02/12  MR#: [id number]  JFH#:[id number]  Patient Care Team: Idelle Crouch, MD as PCP - General (Internal Medicine)  CHIEF COMPLAINT: Right upper lobe lung mass with possible malignant pleural effusion.  INTERVAL HISTORY: Patient is an 84 year old female who recently presented to the emergency room with chief complaint of worsening shortness of breath.  Subsequent work-up included a CT of the chest which revealed a right upper lobe lung mass, mediastinal lymphadenopathy, and pleural effusion highly suspicious for underlying malignancy.  Patient had thoracentesis and felt symptomatically improved.  Currently she feels well.  She has occasional headache, but no other neurologic complaints.  She has a good appetite and denies weight loss.  She denies any recent fevers or illnesses.  She has no chest pain, cough, or hemoptysis.  She denies any nausea, vomiting, constipation, or diarrhea.  She has no urinary complaints.  Patient otherwise feels well and offers no further specific complaints.  REVIEW OF SYSTEMS:   Review of Systems  Constitutional: Negative.  Negative for fever, malaise/fatigue and weight loss.  Respiratory: Positive for shortness of breath. Negative for cough and hemoptysis.   Cardiovascular: Negative.  Negative for chest pain and leg swelling.  Gastrointestinal: Negative.  Negative for abdominal pain and constipation.  Genitourinary: Negative.  Negative for dysuria and flank pain.  Musculoskeletal: Negative.  Negative for back pain.  Skin: Negative.  Negative for rash.  Neurological: Positive for headaches. Negative for sensory change, focal weakness and weakness.  Psychiatric/Behavioral: Negative.  The patient is not nervous/anxious.     As per HPI. Otherwise, a complete review of systems is negative.  PAST MEDICAL HISTORY: Past Medical History:  Diagnosis Date  .  Cataract   . Chronic constipation   . GERD (gastroesophageal reflux disease)   . Osteopenia   . Pancreatitis     PAST SURGICAL HISTORY: Past Surgical History:  Procedure Laterality Date  . APPENDECTOMY  1949  . CATARACT EXTRACTION Right 1987  . CATARACT EXTRACTION Left 1986  . detached retina repair Right 1991  . OOPHORECTOMY  1959   right ovary, part of left  . RETINAL DETACHMENT SURGERY Left 1997  . uterine hysterectomy  1978    FAMILY HISTORY: Family History  Problem Relation Age of Onset  . Breast cancer Neg Hx     ADVANCED DIRECTIVES (Y/N):  '@ADVDIR' @  HEALTH MAINTENANCE: Social History   Tobacco Use  . Smoking status: Former Smoker    Packs/day: 0.50    Years: 30.00    Pack years: 15.00    Types: Cigarettes    Last attempt to quit: 01/25/1993    Years since quitting: 24.4  . Smokeless tobacco: Never Used  Substance Use Topics  . Alcohol use: No  . Drug use: No     Colonoscopy:  PAP:  Bone density:  Lipid panel:  Allergies  Allergen Reactions  . Amoxicillin     Has patient had a PCN reaction causing immediate rash, facial/tongue/throat swelling, SOB or lightheadedness with hypotension: Yes Has patient had a PCN reaction causing severe rash involving mucus membranes or skin necrosis: No Has patient had a PCN reaction that required hospitalization: Unknown Has patient had a PCN reaction occurring within the last 10 years: Unknown If all of the above answers are "NO", then may proceed with Cephalosporin use.   . Codeine     vomiting  . Neosporin [Neomycin-Bacitracin Zn-Polymyx]   . Sulfa Antibiotics  Rash    Current Facility-Administered Medications  Medication Dose Route Frequency Provider Last Rate Last Dose  . acetaminophen (TYLENOL) tablet 650 mg  650 mg Oral Q6H PRN Gouru, Aruna, MD   650 mg at 07/06/17 1320   Or  . acetaminophen (TYLENOL) suppository 650 mg  650 mg Rectal Q6H PRN Gouru, Aruna, MD      . ALPRAZolam Duanne Moron) tablet 0.5 mg  0.5 mg  Oral TID PRN Nicholes Mango, MD   0.5 mg at 07/06/17 2113  . amLODipine (NORVASC) tablet 5 mg  5 mg Oral Daily Gouru, Aruna, MD   5 mg at 07/06/17 0928  . docusate sodium (COLACE) capsule 100 mg  100 mg Oral BID Gouru, Aruna, MD   100 mg at 07/06/17 2113  . irbesartan (AVAPRO) tablet 75 mg  75 mg Oral Daily Gouru, Aruna, MD   75 mg at 07/06/17 0932  . levothyroxine (SYNTHROID, LEVOTHROID) tablet 50 mcg  50 mcg Oral QAC breakfast Nicholes Mango, MD   50 mcg at 07/06/17 0927  . multivitamin with minerals tablet 1 tablet  1 tablet Oral Daily Gouru, Aruna, MD   1 tablet at 07/06/17 0927  . ondansetron (ZOFRAN) tablet 4 mg  4 mg Oral Q6H PRN Gouru, Aruna, MD       Or  . ondansetron (ZOFRAN) injection 4 mg  4 mg Intravenous Q6H PRN Gouru, Aruna, MD      . pantoprazole (PROTONIX) EC tablet 40 mg  40 mg Oral QAC breakfast Gouru, Aruna, MD   40 mg at 07/06/17 0928  . PARoxetine (PAXIL) tablet 30 mg  30 mg Oral Daily Gouru, Aruna, MD   30 mg at 07/06/17 0928  . protein supplement (PREMIER PROTEIN) liquid  11 oz Oral BID BM Fritzi Mandes, MD   11 oz at 07/06/17 1518  . raloxifene (EVISTA) tablet 60 mg  60 mg Oral Daily Gouru, Aruna, MD   60 mg at 07/06/17 0927  . umeclidinium-vilanterol (ANORO ELLIPTA) 62.5-25 MCG/INH 1 puff  1 puff Inhalation Daily Gouru, Aruna, MD   1 puff at 07/06/17 0929  . vitamin E capsule 400 Units  400 Units Oral Daily Gouru, Illene Silver, MD   400 Units at 07/06/17 0927    OBJECTIVE: Vitals:   07/06/17 1408 07/06/17 2004  BP: (!) 164/60 (!) 126/50  Pulse: 70 70  Resp: 20 16  Temp: 97.8 F (36.6 C) 97.9 F (36.6 C)  SpO2:  96%     Body mass index is 31 kg/m.    ECOG FS:1 - Symptomatic but completely ambulatory  General: Well-developed, well-nourished, no acute distress. Eyes: Pink conjunctiva, anicteric sclera. HEENT: Normocephalic, moist mucous membranes, clear oropharnyx. Lungs: Clear to auscultation bilaterally. Heart: Regular rate and rhythm. No rubs, murmurs, or  gallops. Abdomen: Soft, nontender, nondistended. No organomegaly noted, normoactive bowel sounds. Musculoskeletal: No edema, cyanosis, or clubbing. Neuro: Alert, answering all questions appropriately. Cranial nerves grossly intact. Skin: No rashes or petechiae noted. Psych: Normal affect. Lymphatics: No cervical, calvicular, axillary or inguinal LAD.   LAB RESULTS:  Lab Results  Component Value Date   NA 133 (L) 07/06/2017   K 4.4 07/06/2017   CL 103 07/06/2017   CO2 22 07/06/2017   GLUCOSE 94 07/06/2017   BUN 19 07/06/2017   CREATININE 0.84 07/06/2017   CALCIUM 8.2 (L) 07/06/2017   PROT 6.4 (L) 07/06/2017   ALBUMIN 2.9 (L) 07/06/2017   AST 17 07/06/2017   ALT 12 (L) 07/06/2017   ALKPHOS 44 07/06/2017  BILITOT 0.4 07/06/2017   GFRNONAA >60 07/06/2017   GFRAA >60 07/06/2017    Lab Results  Component Value Date   WBC 8.7 07/06/2017   NEUTROABS 6.6 (H) 05/28/2016   HGB 10.5 (L) 07/06/2017   HCT 30.4 (L) 07/06/2017   MCV 82.2 07/06/2017   PLT 376 07/06/2017     STUDIES: Dg Chest 1 View  Result Date: 07/06/2017 CLINICAL DATA:  Status post RIGHT-sided thoracentesis. 1 L of fluid removed. EXAM: CHEST  1 VIEW COMPARISON:  Chest x-ray dated 07/05/2017. FINDINGS: Improved aeration at the RIGHT lung base status post thoracentesis. No pneumothorax seen. Stable appearance of the RIGHT upper lobe mass in the short-term interval. Stable cardiomegaly. Overall cardiomediastinal silhouette is stable. IMPRESSION: No residual pleural effusion appreciated status post RIGHT-sided thoracentesis. No pneumothorax or other procedural complicating features seen. Electronically Signed   By: Franki Cabot M.D.   On: 07/06/2017 09:04   Dg Chest 2 View  Result Date: 07/05/2017 CLINICAL DATA:  Shortness of breath today. Malaise for 1 month. Former smoker. EXAM: CHEST - 2 VIEW COMPARISON:  Chest radiographs 05/28/2016. Chest CT 04/01/2015. Esophagram 11/19/2016. FINDINGS: The heart size and  mediastinal contours are stable. There is aortic atherosclerosis. Right subpulmonic pleural effusion has enlarged compared with the prior radiographs. Peripheral mid right lung density on the frontal examination is new and could reflect extension of fluid into the fissure. This projects over the aortic arch on the lateral view. There is mildly increased right basilar pulmonary opacity. The left lung is clear. Multiple healed rib fractures are present on the right. Left thoracotomy defect and probable remote rib fractures are also noted. IMPRESSION: Compared with 2018 radiographs, there are new pleuroparenchymal opacities within the right hemithorax which may be related to the previously demonstrated right chest wall injury and rib fractures. Other considerations include pneumonia and neoplasm. Further evaluation with chest CT recommended. Electronically Signed   By: Richardean Sale M.D.   On: 07/05/2017 14:41   Ct Chest W Contrast  Result Date: 07/05/2017 CLINICAL DATA:  Shortness of breath and abnormal chest x-ray. Known pancreatic mass. EXAM: CT CHEST WITH CONTRAST TECHNIQUE: Multidetector CT imaging of the chest was performed during intravenous contrast administration. CONTRAST:  34m OMNIPAQUE IOHEXOL 300 MG/ML  SOLN COMPARISON:  Chest x-ray 07/05/2017 and chest CT 04/01/2015 FINDINGS: Cardiovascular: The heart is normal in size. No pericardial effusion. The aorta is normal in caliber. Scattered atherosclerotic calcifications. No dissection. The branch vessels are patent. Three-vessel coronary artery calcifications are noted. Mediastinum/Nodes: Mediastinal and hilar adenopathy. Index right hilar node on image number 63 measures 14 mm. 8.5 mm precarinal lymph node on image number 54. 10 mm subcarinal lymph node on image number 67. 9.5 mm left infrahilar lymph node on image number 71. 8 mm epicardial lymph node on image number 98. Lungs/Pleura: Large right upper lobe lung mass measures 4.9 x 3.1 x 3.6 cm. This  is most consistent with a primary lung neoplasm. 8.5 mm subpleural nodular density in the left upper lobe on image number 20 is unchanged since 2017 and likely benign. Spiculated 11 mm right upper lobe lesion on image number 69 could be a metastatic focus or a second primary. 7 mm pulmonary nodule in the right upper lobe adjacent to the major fissure on image number 70 suspicious for metastasis. Moderate to large right pleural effusion, likely malignant. No obvious enhancing pleural nodules. Upper Abdomen: There is a large complex cystic and solid mass projecting off of the upper aspect of the  pancreatic body and into the lesser sac. Measures approximately 9.2 x 8.5 cm. It was also present on a prior CT scan from 2015 where it measured 5.5 x 5.1 cm. Scattered small surrounding lymph nodes are noted. Stable low-attenuation lesion in the left hepatic lobe, consistent with benign hepatic cysts. The gallbladder is contracted. Musculoskeletal: No significant bony findings. No obvious metastatic bone disease. IMPRESSION: 1. Large right upper lobe lung mass along with smaller right lung lesions as detailed above. These could be satellite lesions, metastasis or synchronous cancers. Metastasis is also possible given the large pancreatic mass but I think that is much less likely. 2. Mediastinal and hilar lymphadenopathy. 3. Moderate to large right pleural effusion, likely pathologic. 4. 9.2 x 8.5 cm pancreatic mass, enlarged since prior studies. 5. PET-CT may be helpful for further evaluation and staging purposes. Aortic Atherosclerosis (ICD10-I70.0) and Emphysema (ICD10-J43.9). Electronically Signed   By: Marijo Sanes M.D.   On: 07/05/2017 15:49   Mr Jeri Cos LE Contrast  Result Date: 07/06/2017 CLINICAL DATA:  Lung mass.  Staging for metastatic disease. EXAM: MRI HEAD WITHOUT AND WITH CONTRAST TECHNIQUE: Multiplanar, multiecho pulse sequences of the brain and surrounding structures were obtained without and with  intravenous contrast. CONTRAST:  28m MULTIHANCE GADOBENATE DIMEGLUMINE 529 MG/ML IV SOLN COMPARISON:  CT head 05/28/2016 FINDINGS: Brain: Negative for acute infarct. Negative for metastatic disease. No enhancing mass lesion. Mild atrophy and mild chronic microvascular ischemic change in the white matter. Negative for hemorrhage mass or edema. Vascular: Normal arterial flow void Skull and upper cervical spine: Negative Sinuses/Orbits: Negative Other: None IMPRESSION: Negative for metastatic disease Mild atrophy and mild chronic microvascular ischemic change in the white matter. Electronically Signed   By: CFranchot GalloM.D.   On: 07/06/2017 15:14   [place]oracentesis Asp Pleural Space W/img Guide  Result Date: 07/06/2017 CLINICAL DATA:  Right lung mass, pleural effusion EXAM: ULTRASOUND GUIDED RIGHT THORACENTESIS COMPARISON:  07/05/2017 PROCEDURE: An ultrasound guided thoracentesis was thoroughly discussed with the patient and questions answered. The benefits, risks, alternatives and complications were also discussed. The patient understands and wishes to proceed with the procedure. Written consent was obtained. Ultrasound was performed to localize and mark an adequate pocket of fluid in the right chest. The area was then prepped and draped in the normal sterile fashion. 1% Lidocaine was used for local anesthesia. Under ultrasound guidance a Safe-T-Centesis needle catheter was introduced. Thoracentesis was performed. The catheter was removed and a dressing applied. Complications:  None immediate FINDINGS: A total of approximately 1 L of serosanguineous amber pleural fluid was removed. A fluid sample wassent for laboratory analysis. IMPRESSION: Successful ultrasound guided right thoracentesis yielding 1 L of pleural fluid. Electronically Signed   By: MJerilynn Mages  Shick M.D.   On: 07/06/2017 08:51    ASSESSMENT: Right upper lobe lung mass with possible malignant pleural effusion.   PLAN:    1.  Right upper lobe lung  mass: Given mediastinal lymphadenopathy and pleural effusion, this is highly suspicious for underlying malignancy.  Patient had thoracentesis earlier today and pathology is pending.  Given suspicion for underlying lung cancer, will get CT-guided biopsy tomorrow for additional tissue to ensure we have enough for additional testing such as EGFR and PDL-1.  [facility] to discuss the results and treatment planning.  Will schedule outpatient PET prior to her clinic appointment. 2.  Pleural effusion: Likely malignant, thoracentesis as above. 3.  Headaches: Will get MRI  of the brain for further evaluation as well as to complete the staging work-up.  Appreciate consult, will follow.  Lloyd Huger, MD   07/06/2017 11:32 PM

## 2017-07-06 NOTE — Progress Notes (Addendum)
Patient ID: Nicole Arnold, female   DOB: 08/13/1928, 84 y.o.   MRN: [id number] per Dr. Reuel Derby fluid cytology so far negative for malignancy. She does have one more slide to review tomorrow. Will keep patient NPO after midnight in anticipation for CT guided biopsy of lung mass. disContinue aspirin and Lovenox for now.

## 2017-07-06 NOTE — Progress Notes (Signed)
[facility] [place] NAME: Nicole Arnold    MR#:  [id number]  DATE OF BIRTH:  1928-04-15  SUBJECTIVE:  patient came in with increasing shortness of breath. She was found a large pleural effusion underwent thoracentesis. She feels a lot better. Complains of some rib pain on the right side. No fever. No cough  daughter and son-in-law in the room.  REVIEW OF SYSTEMS:   Review of Systems  Constitutional: Negative for chills, fever and weight loss.  HENT: Negative for ear discharge, ear pain and nosebleeds.   Eyes: Negative for blurred vision, pain and discharge.  Respiratory: Positive for shortness of breath. Negative for sputum production, wheezing and stridor.   Cardiovascular: Negative for chest pain, palpitations, orthopnea and PND.  Gastrointestinal: Negative for abdominal pain, diarrhea, nausea and vomiting.  Genitourinary: Negative for frequency and urgency.  Musculoskeletal: Negative for back pain and joint pain.  Neurological: Negative for sensory change, speech change, focal weakness and weakness.  Psychiatric/Behavioral: Negative for depression and hallucinations. The patient is not nervous/anxious.    Tolerating Diet:yes Tolerating PT: pending  DRUG ALLERGIES:   Allergies  Allergen Reactions  . Amoxicillin     Has patient had a PCN reaction causing immediate rash, facial/tongue/throat swelling, SOB or lightheadedness with hypotension: Yes Has patient had a PCN reaction causing severe rash involving mucus membranes or skin necrosis: No Has patient had a PCN reaction that required hospitalization: Unknown Has patient had a PCN reaction occurring within the last 10 years: Unknown If all of the above answers are "NO", then may proceed with Cephalosporin use.   . Codeine     vomiting  . Neosporin [Neomycin-Bacitracin Zn-Polymyx]   . Sulfa Antibiotics Rash    VITALS:  Blood pressure (!) 90/42, pulse 86, temperature  98.1 F (36.7 C), temperature source Oral, resp. rate 16, height 5\' 3"  (1.6 m), weight 79.4 kg (175 lb), SpO2 95 %.  PHYSICAL EXAMINATION:   Physical Exam  GENERAL:  84 y.o.-year-old patient lying in the bed with no acute distress.  EYES: Pupils equal, round, reactive to light and accommodation. No scleral icterus. Extraocular muscles intact.  HEENT: Head atraumatic, normocephalic. Oropharynx and nasopharynx clear.  NECK:  Supple, no jugular venous distention. No thyroid enlargement, no tenderness.  LUNGS: Normal breath sounds bilaterally, no wheezing, rales, rhonchi. No use of accessory muscles of respiration.  CARDIOVASCULAR: S1, S2 normal. No murmurs, rubs, or gallops.  ABDOMEN: Soft, nontender, nondistended. Bowel sounds present. No organomegaly or mass.  EXTREMITIES: No cyanosis, clubbing or edema b/l.    NEUROLOGIC: Cranial nerves II through XII are intact. No focal Motor or sensory deficits b/l.   PSYCHIATRIC:  patient is alert and oriented x 3.  SKIN: No obvious rash, lesion, or ulcer.   LABORATORY PANEL:  CBC Recent Labs  Lab 07/06/17 0448  WBC 8.7  HGB 10.5*  HCT 30.4*  PLT 376    Chemistries  Recent Labs  Lab 07/06/17 0448  NA 133*  K 4.4  CL 103  CO2 22  GLUCOSE 94  BUN 19  CREATININE 0.84  CALCIUM 8.2*  AST 17  ALT 12*  ALKPHOS 44  BILITOT 0.4   Cardiac Enzymes Recent Labs  Lab 07/05/17 1329  TROPONINI <0.03   RADIOLOGY:  Dg Chest 1 View  Result Date: 07/06/2017 CLINICAL DATA:  Status post RIGHT-sided thoracentesis. 1 L of fluid removed. EXAM: CHEST  1 VIEW COMPARISON:  Chest x-ray dated 07/05/2017. FINDINGS: Improved aeration  at the RIGHT lung base status post thoracentesis. No pneumothorax seen. Stable appearance of the RIGHT upper lobe mass in the short-term interval. Stable cardiomegaly. Overall cardiomediastinal silhouette is stable. IMPRESSION: No residual pleural effusion appreciated status post RIGHT-sided thoracentesis. No pneumothorax or  other procedural complicating features seen. Electronically Signed   By: Franki Cabot M.D.   On: 07/06/2017 09:04   Dg Chest 2 View  Result Date: 07/05/2017 CLINICAL DATA:  Shortness of breath today. Malaise for 1 month. Former smoker. EXAM: CHEST - 2 VIEW COMPARISON:  Chest radiographs 05/28/2016. Chest CT 04/01/2015. Esophagram 11/19/2016. FINDINGS: The heart size and mediastinal contours are stable. There is aortic atherosclerosis. Right subpulmonic pleural effusion has enlarged compared with the prior radiographs. Peripheral mid right lung density on the frontal examination is new and could reflect extension of fluid into the fissure. This projects over the aortic arch on the lateral view. There is mildly increased right basilar pulmonary opacity. The left lung is clear. Multiple healed rib fractures are present on the right. Left thoracotomy defect and probable remote rib fractures are also noted. IMPRESSION: Compared with 2018 radiographs, there are new pleuroparenchymal opacities within the right hemithorax which may be related to the previously demonstrated right chest wall injury and rib fractures. Other considerations include pneumonia and neoplasm. Further evaluation with chest CT recommended. Electronically Signed   By: Richardean Sale M.D.   On: 07/05/2017 14:41   Ct Chest W Contrast  Result Date: 07/05/2017 CLINICAL DATA:  Shortness of breath and abnormal chest x-ray. Known pancreatic mass. EXAM: CT CHEST WITH CONTRAST TECHNIQUE: Multidetector CT imaging of the chest was performed during intravenous contrast administration. CONTRAST:  55mL OMNIPAQUE IOHEXOL 300 MG/ML  SOLN COMPARISON:  Chest x-ray 07/05/2017 and chest CT 04/01/2015 FINDINGS: Cardiovascular: The heart is normal in size. No pericardial effusion. The aorta is normal in caliber. Scattered atherosclerotic calcifications. No dissection. The branch vessels are patent. Three-vessel coronary artery calcifications are noted.  Mediastinum/Nodes: Mediastinal and hilar adenopathy. Index right hilar node on image number 63 measures 14 mm. 8.5 mm precarinal lymph node on image number 54. 10 mm subcarinal lymph node on image number 67. 9.5 mm left infrahilar lymph node on image number 71. 8 mm epicardial lymph node on image number 98. Lungs/Pleura: Large right upper lobe lung mass measures 4.9 x 3.1 x 3.6 cm. This is most consistent with a primary lung neoplasm. 8.5 mm subpleural nodular density in the left upper lobe on image number 20 is unchanged since 2017 and likely benign. Spiculated 11 mm right upper lobe lesion on image number 69 could be a metastatic focus or a second primary. 7 mm pulmonary nodule in the right upper lobe adjacent to the major fissure on image number 70 suspicious for metastasis. Moderate to large right pleural effusion, likely malignant. No obvious enhancing pleural nodules. Upper Abdomen: There is a large complex cystic and solid mass projecting off of the upper aspect of the pancreatic body and into the lesser sac. Measures approximately 9.2 x 8.5 cm. It was also present on a prior CT scan from 2015 where it measured 5.5 x 5.1 cm. Scattered small surrounding lymph nodes are noted. Stable low-attenuation lesion in the left hepatic lobe, consistent with benign hepatic cysts. The gallbladder is contracted. Musculoskeletal: No significant bony findings. No obvious metastatic bone disease. IMPRESSION: 1. Large right upper lobe lung mass along with smaller right lung lesions as detailed above. These could be satellite lesions, metastasis or synchronous cancers. Metastasis is also  possible given the large pancreatic mass but I think that is much less likely. 2. Mediastinal and hilar lymphadenopathy. 3. Moderate to large right pleural effusion, likely pathologic. 4. 9.2 x 8.5 cm pancreatic mass, enlarged since prior studies. 5. PET-CT may be helpful for further evaluation and staging purposes. Aortic Atherosclerosis  (ICD10-I70.0) and Emphysema (ICD10-J43.9). Electronically Signed   By: Marijo Sanes M.D.   On: 07/05/2017 15:49   US Thoracentesis Asp Pleural Space W/img Guide  Result Date: 07/06/2017 CLINICAL DATA:  Right lung mass, pleural effusion EXAM: ULTRASOUND GUIDED RIGHT THORACENTESIS COMPARISON:  07/05/2017 PROCEDURE: An ultrasound guided thoracentesis was thoroughly discussed with the patient and questions answered. The benefits, risks, alternatives and complications were also discussed. The patient understands and wishes to proceed with the procedure. Written consent was obtained. Ultrasound was performed to localize and mark an adequate pocket of fluid in the right chest. The area was then prepped and draped in the normal sterile fashion. 1% Lidocaine was used for local anesthesia. Under ultrasound guidance a Safe-T-Centesis needle catheter was introduced. Thoracentesis was performed. The catheter was removed and a dressing applied. Complications:  None immediate FINDINGS: A total of approximately 1 L of serosanguineous amber pleural fluid was removed. A fluid sample wassent for laboratory analysis. IMPRESSION: Successful ultrasound guided right thoracentesis yielding 1 L of pleural fluid. Electronically Signed   By: Jerilynn Mages.  Shick M.D.   On: 07/06/2017 08:51   ASSESSMENT AND PLAN:  Nicole Arnold  is a 84 y.o. female with a known history of chronic pancreatitis, essential hypertension, hypothyroidism is presenting to the ED with a chief complaint of worsening of shortness of breath.  Patient is reporting that while resting she is comfortable but with minimal exertion she is very short of breath.  CT chest has revealed a right upper lobe lung mass with mediastinal lymphadenopathy and large pleural effusion  #Acute respiratory distress secondary to lung mass with large right-sided pleural effusion -status post thoracentesis right-sided with 1 L fluid removal. -Oxygen via nasal cannula -Bronchodilator  treatment as needed -oxygen saturation's normal on room air  #Right upper lobe lung mass with a large pleural effusion with mediastinal lymphadenopathy on CT scan -oncology consultation with Dr. Grayland Ormond appreciated. Patient will get MRI of the brain tomorrow to rule out meds -will discussed with radiology to see if CT guided biopsy can be obtained tomorrow  #Pancreatic mass with past medical history of pancreatic pseudocyst -pt has had complex mass in the pancreas in 2015 as well  #Essential hypertension - continue amlodipine, Micardis  #Hypothyroidism  -continue Synthroid TSH is normal  Discussed with patient's daughter and son-in-law. Case discussed with Dr. Grayland Ormond   Case discussed with Care Management/Social Worker. Management plans discussed with the patient, family and they are in agreement.  CODE STATUS: full  DVT Prophylaxis: lovenox  TOTAL TIME TAKING CARE OF THIS PATIENT: *30* minutes.  >50% time spent on counselling and coordination of care  POSSIBLE D/C IN 1-2DAYS, DEPENDING ON CLINICAL CONDITION.  Note: This dictation was prepared with Dragon dictation along with smaller phrase technology. Any transcriptional errors that result from this process are unintentional.  Fritzi Mandes M.D on 07/06/2017 at 1:59 PM  Between 7am to 6pm - Pager - [phone]  After 6pm go to [email or web address] - password [email or web address] [facility]  Office  [phone]  CC: Primary care physician; Idelle Crouch, MDPatient ID: [name], female   DOB: [birth year], 84 y.o.   MRN: [id number]

## 2017-07-06 NOTE — Plan of Care (Signed)

## 2017-07-06 NOTE — Progress Notes (Signed)
Dr. Chauncey Cruel. Patel notified per Dr. Broadus John wait until cytology on pleural fluid received-delay Lung biopsy until then.

## 2017-07-07 ENCOUNTER — Encounter: Payer: Self-pay | Admitting: Oncology

## 2017-07-07 ENCOUNTER — Inpatient Hospital Stay: Payer: Medicare Other

## 2017-07-07 MED ORDER — FENTANYL CITRATE (PF) 100 MCG/2ML IJ SOLN
INTRAMUSCULAR | Status: AC
  Filled 2017-07-07: qty 4

## 2017-07-07 MED ORDER — MIDAZOLAM HCL 5 MG/5ML IJ SOLN
INTRAMUSCULAR | Status: AC
  Filled 2017-07-07: qty 5

## 2017-07-07 MED ORDER — FENTANYL CITRATE (PF) 100 MCG/2ML IJ SOLN
INTRAMUSCULAR | Status: AC | PRN
  Administered 2017-07-07: 50 ug via INTRAVENOUS
  Administered 2017-07-07: 25 ug via INTRAVENOUS

## 2017-07-07 MED ORDER — LIDOCAINE HCL (PF) 1 % IJ SOLN
INTRAMUSCULAR | Status: AC | PRN
  Administered 2017-07-07: 5 mL

## 2017-07-07 MED ORDER — ENOXAPARIN SODIUM 40 MG/0.4ML ~~LOC~~ SOLN
40.0000 mg | SUBCUTANEOUS | Status: DC
  Filled 2017-07-07: qty 0.4

## 2017-07-07 MED ORDER — SODIUM CHLORIDE 0.9 % IV SOLN
INTRAVENOUS | Status: AC | PRN
  Administered 2017-07-07: 10 mL/h via INTRAVENOUS

## 2017-07-07 MED ORDER — MIDAZOLAM HCL 5 MG/5ML IJ SOLN
INTRAMUSCULAR | Status: AC | PRN
  Administered 2017-07-07: 1 mg via INTRAVENOUS
  Administered 2017-07-07: 0.5 mg via INTRAVENOUS

## 2017-07-07 NOTE — Progress Notes (Signed)
[facility] at [place] NAME: Nicole Arnold    MR#:  [id number]  DATE OF BIRTH:  12-Sep-1928  SUBJECTIVE:  patient came in with increasing shortness of breath. She was found a large pleural effusion underwent thoracentesis. She feels a lot better. Complains of some rib pain on the right side. No fever. No cough  No new complaints. Patient states she slept well.  REVIEW OF SYSTEMS:   Review of Systems  Constitutional: Negative for chills, fever and weight loss.  HENT: Negative for ear discharge, ear pain and nosebleeds.   Eyes: Negative for blurred vision, pain and discharge.  Respiratory: Positive for shortness of breath. Negative for sputum production, wheezing and stridor.   Cardiovascular: Negative for chest pain, palpitations, orthopnea and PND.  Gastrointestinal: Negative for abdominal pain, diarrhea, nausea and vomiting.  Genitourinary: Negative for frequency and urgency.  Musculoskeletal: Negative for back pain and joint pain.  Neurological: Negative for sensory change, speech change, focal weakness and weakness.  Psychiatric/Behavioral: Negative for depression and hallucinations. The patient is not nervous/anxious.    Tolerating Diet:yes Tolerating PT: ambulatory  DRUG ALLERGIES:   Allergies  Allergen Reactions  . Amoxicillin     Has patient had a PCN reaction causing immediate rash, facial/tongue/throat swelling, SOB or lightheadedness with hypotension: Yes Has patient had a PCN reaction causing severe rash involving mucus membranes or skin necrosis: No Has patient had a PCN reaction that required hospitalization: Unknown Has patient had a PCN reaction occurring within the last 10 years: Unknown If all of the above answers are "NO", then may proceed with Cephalosporin use.   . Codeine     vomiting  . Neosporin [Neomycin-Bacitracin Zn-Polymyx]   . Sulfa Antibiotics Rash    VITALS:  Blood pressure (!) 139/56, pulse  77, temperature 97.8 F (36.6 C), temperature source Oral, resp. rate 15, height 5\' 3"  (1.6 m), weight 79.4 kg (175 lb), SpO2 94 %.  PHYSICAL EXAMINATION:   Physical Exam  GENERAL:  84 y.o.-year-old patient lying in the bed with no acute distress.  EYES: Pupils equal, round, reactive to light and accommodation. No scleral icterus. Extraocular muscles intact.  HEENT: Head atraumatic, normocephalic. Oropharynx and nasopharynx clear.  NECK:  Supple, no jugular venous distention. No thyroid enlargement, no tenderness.  LUNGS: Normal breath sounds bilaterally, no wheezing, rales, rhonchi. No use of accessory muscles of respiration.  CARDIOVASCULAR: S1, S2 normal. No murmurs, rubs, or gallops.  ABDOMEN: Soft, nontender, nondistended. Bowel sounds present. No organomegaly or mass.  EXTREMITIES: No cyanosis, clubbing or edema b/l.    NEUROLOGIC: Cranial nerves II through XII are intact. No focal Motor or sensory deficits b/l.   PSYCHIATRIC:  patient is alert and oriented x 3.  SKIN: No obvious rash, lesion, or ulcer.   LABORATORY PANEL:  CBC Recent Labs  Lab 07/06/17 0448  WBC 8.7  HGB 10.5*  HCT 30.4*  PLT 376    Chemistries  Recent Labs  Lab 07/06/17 0448  NA 133*  K 4.4  CL 103  CO2 22  GLUCOSE 94  BUN 19  CREATININE 0.84  CALCIUM 8.2*  AST 17  ALT 12*  ALKPHOS 44  BILITOT 0.4   Cardiac Enzymes Recent Labs  Lab 07/05/17 1329  TROPONINI <0.03   RADIOLOGY:  Dg Chest 1 View  Result Date: 07/06/2017 CLINICAL DATA:  Status post RIGHT-sided thoracentesis. 1 L of fluid removed. EXAM: CHEST  1 VIEW COMPARISON:  Chest x-ray dated 07/05/2017. FINDINGS:  Improved aeration at the RIGHT lung base status post thoracentesis. No pneumothorax seen. Stable appearance of the RIGHT upper lobe mass in the short-term interval. Stable cardiomegaly. Overall cardiomediastinal silhouette is stable. IMPRESSION: No residual pleural effusion appreciated status post RIGHT-sided thoracentesis. No  pneumothorax or other procedural complicating features seen. Electronically Signed   By: Franki Cabot M.D.   On: 07/06/2017 09:04   Dg Chest 2 View  Result Date: 07/05/2017 CLINICAL DATA:  Shortness of breath today. Malaise for 1 month. Former smoker. EXAM: CHEST - 2 VIEW COMPARISON:  Chest radiographs 05/28/2016. Chest CT 04/01/2015. Esophagram 11/19/2016. FINDINGS: The heart size and mediastinal contours are stable. There is aortic atherosclerosis. Right subpulmonic pleural effusion has enlarged compared with the prior radiographs. Peripheral mid right lung density on the frontal examination is new and could reflect extension of fluid into the fissure. This projects over the aortic arch on the lateral view. There is mildly increased right basilar pulmonary opacity. The left lung is clear. Multiple healed rib fractures are present on the right. Left thoracotomy defect and probable remote rib fractures are also noted. IMPRESSION: Compared with 2018 radiographs, there are new pleuroparenchymal opacities within the right hemithorax which may be related to the previously demonstrated right chest wall injury and rib fractures. Other considerations include pneumonia and neoplasm. Further evaluation with chest CT recommended. Electronically Signed   By: Richardean Sale M.D.   On: 07/05/2017 14:41   Ct Chest W Contrast  Result Date: 07/05/2017 CLINICAL DATA:  Shortness of breath and abnormal chest x-ray. Known pancreatic mass. EXAM: CT CHEST WITH CONTRAST TECHNIQUE: Multidetector CT imaging of the chest was performed during intravenous contrast administration. CONTRAST:  33mL OMNIPAQUE IOHEXOL 300 MG/ML  SOLN COMPARISON:  Chest x-ray 07/05/2017 and chest CT 04/01/2015 FINDINGS: Cardiovascular: The heart is normal in size. No pericardial effusion. The aorta is normal in caliber. Scattered atherosclerotic calcifications. No dissection. The branch vessels are patent. Three-vessel coronary artery calcifications are  noted. Mediastinum/Nodes: Mediastinal and hilar adenopathy. Index right hilar node on image number 63 measures 14 mm. 8.5 mm precarinal lymph node on image number 54. 10 mm subcarinal lymph node on image number 67. 9.5 mm left infrahilar lymph node on image number 71. 8 mm epicardial lymph node on image number 98. Lungs/Pleura: Large right upper lobe lung mass measures 4.9 x 3.1 x 3.6 cm. This is most consistent with a primary lung neoplasm. 8.5 mm subpleural nodular density in the left upper lobe on image number 20 is unchanged since 2017 and likely benign. Spiculated 11 mm right upper lobe lesion on image number 69 could be a metastatic focus or a second primary. 7 mm pulmonary nodule in the right upper lobe adjacent to the major fissure on image number 70 suspicious for metastasis. Moderate to large right pleural effusion, likely malignant. No obvious enhancing pleural nodules. Upper Abdomen: There is a large complex cystic and solid mass projecting off of the upper aspect of the pancreatic body and into the lesser sac. Measures approximately 9.2 x 8.5 cm. It was also present on a prior CT scan from 2015 where it measured 5.5 x 5.1 cm. Scattered small surrounding lymph nodes are noted. Stable low-attenuation lesion in the left hepatic lobe, consistent with benign hepatic cysts. The gallbladder is contracted. Musculoskeletal: No significant bony findings. No obvious metastatic bone disease. IMPRESSION: 1. Large right upper lobe lung mass along with smaller right lung lesions as detailed above. These could be satellite lesions, metastasis or synchronous cancers. Metastasis  is also possible given the large pancreatic mass but I think that is much less likely. 2. Mediastinal and hilar lymphadenopathy. 3. Moderate to large right pleural effusion, likely pathologic. 4. 9.2 x 8.5 cm pancreatic mass, enlarged since prior studies. 5. PET-CT may be helpful for further evaluation and staging purposes. Aortic Atherosclerosis  (ICD10-I70.0) and Emphysema (ICD10-J43.9). Electronically Signed   By: Marijo Sanes M.D.   On: 07/05/2017 15:49   Mr Jeri Cos NG Contrast  Result Date: 07/06/2017 CLINICAL DATA:  Lung mass.  Staging for metastatic disease. EXAM: MRI HEAD WITHOUT AND WITH CONTRAST TECHNIQUE: Multiplanar, multiecho pulse sequences of the brain and surrounding structures were obtained without and with intravenous contrast. CONTRAST:  41mL MULTIHANCE GADOBENATE DIMEGLUMINE 529 MG/ML IV SOLN COMPARISON:  CT head 05/28/2016 FINDINGS: Brain: Negative for acute infarct. Negative for metastatic disease. No enhancing mass lesion. Mild atrophy and mild chronic microvascular ischemic change in the white matter. Negative for hemorrhage mass or edema. Vascular: Normal arterial flow void Skull and upper cervical spine: Negative Sinuses/Orbits: Negative Other: None IMPRESSION: Negative for metastatic disease Mild atrophy and mild chronic microvascular ischemic change in the white matter. Electronically Signed   By: Franchot Gallo M.D.   On: 07/06/2017 15:14   US Thoracentesis Asp Pleural Space W/img Guide  Result Date: 07/06/2017 CLINICAL DATA:  Right lung mass, pleural effusion EXAM: ULTRASOUND GUIDED RIGHT THORACENTESIS COMPARISON:  07/05/2017 PROCEDURE: An ultrasound guided thoracentesis was thoroughly discussed with the patient and questions answered. The benefits, risks, alternatives and complications were also discussed. The patient understands and wishes to proceed with the procedure. Written consent was obtained. Ultrasound was performed to localize and mark an adequate pocket of fluid in the right chest. The area was then prepped and draped in the normal sterile fashion. 1% Lidocaine was used for local anesthesia. Under ultrasound guidance a Safe-T-Centesis needle catheter was introduced. Thoracentesis was performed. The catheter was removed and a dressing applied. Complications:  None immediate FINDINGS: A total of approximately 1  L of serosanguineous amber pleural fluid was removed. A fluid sample wassent for laboratory analysis. IMPRESSION: Successful ultrasound guided right thoracentesis yielding 1 L of pleural fluid. Electronically Signed   By: Jerilynn Mages.  Shick M.D.   On: 07/06/2017 08:51   ASSESSMENT AND PLAN:  Jose Alleyne  is a 84 y.o. female with a known history of chronic pancreatitis, essential hypertension, hypothyroidism is presenting to the ED with a chief complaint of worsening of shortness of breath.  Patient is reporting that while resting she is comfortable but with minimal exertion she is very short of breath.  CT chest has revealed a right upper lobe lung mass with mediastinal lymphadenopathy and large pleural effusion  #Acute respiratory distress secondary to lung mass with large right-sided pleural effusion -status post thoracentesis right-sided with 1 L fluid removal. -Oxygen via nasal cannula -Bronchodilator treatment as needed -oxygen saturation's normal on room air  #Right upper lobe lung mass with a large pleural effusion with mediastinal lymphadenopathy on CT scan -oncology consultation with Dr. Grayland Ormond appreciated.  -MRI of the brain is negative. -will discussed with radiology to see if CT guided biopsy can be obtained today -cytology negative for malignant cells positive for inflammatory cells only per Dr. Reuel Derby  #Pancreatic mass with past medical history of pancreatic pseudocyst -pt has had complex mass in the pancreas in 2015 as well  #Essential hypertension - continue amlodipine, Micardis  #Hypothyroidism  -continue Synthroid TSH is normal  Discussed with patient's daughter and son-in-law. Case  discussed with Dr. Grayland Ormond   Case discussed with Care Management/Social Worker. Management plans discussed with the patient, family and they are in agreement.  CODE STATUS: full  DVT Prophylaxis: lovenox  TOTAL TIME TAKING CARE OF THIS PATIENT: *30* minutes.  >50% time spent on  counselling and coordination of care  POSSIBLE D/C IN 1-2DAYS, DEPENDING ON CLINICAL CONDITION.  Note: This dictation was prepared with Dragon dictation along with smaller phrase technology. Any transcriptional errors that result from this process are unintentional.  Fritzi Mandes M.D on 07/07/2017 at 8:30 AM  Between 7am to 6pm - Pager - [phone]  After 6pm go to [email or web address] - password [email or web address] [facility]  Office  [phone]  CC: Primary care physician; Idelle Crouch, MDPatient ID: Nicole Arnold, female   DOB: July 21, 1928, 83 y.o.   MRN: [id number]

## 2017-07-07 NOTE — Plan of Care (Signed)
S/p R lung biopsy. Bandaid at puncture site dry and intact. VSS. Pt denies pain.

## 2017-07-07 NOTE — Progress Notes (Signed)
MEDICATION-RELATED CONSULT NOTE   IR Procedure Consult - Anticoagulant/Antiplatelet PTA/Inpatient Med List Review by Pharmacist    Procedure: biopsy of pulmonary nodule    Completed: 6/13 1700  Post-Procedural bleeding risk per IR MD assessment:  low  Antithrombotic medications on inpatient or PTA profile prior to procedure:   Lovenox    Recommended restart time per IR Post-Procedure Guidelines:  Day 0 + 4 hours   Other considerations:   Procedure listed as standard risk per chart   Plan:    Patient has no h/o VTE. Will use standard bleeding risk timing and resume Lovenox 40 mg daily from tomorrow.   Ulice Dash, PharmD Clinical Pharmacist

## 2017-07-07 NOTE — Procedures (Signed)
Interventional Radiology Procedure Note  Procedure: CT guided biopsy of RUL pulmonary nodule Complications: No immediate Recommendations: - Bedrest until CXR cleared.  Minimize talking, coughing or otherwise straining.  - Follow up 2 hr CXR pending   Signed,  Criselda Peaches, MD

## 2017-07-07 NOTE — Clinical Social Work Note (Signed)
CSW received a call from Seth Bake, Development worker, international aid at [facility]. Seth Bake states that patient is a resident in their [place] living and is also the primary caregiver for her husband who also lives at [facility]. Per Seth Bake patient's family is requesting that patient come to the skilled nursing part of [place] at discharge. Patient will need PT eval for insurance. CSW will follow for discharge needs. Formal assessment to follow.   [place], [place]

## 2017-07-08 LAB — COMP PANEL: LEUKEMIA/LYMPHOMA

## 2017-07-08 LAB — CYTOLOGY - NON PAP

## 2017-07-08 NOTE — Progress Notes (Signed)
Pt is being discharged to [facility]. Report given to Graciela Husbands, LPN. Discharge papers given and explained to pt. Pt verbalized understanding. Meds and f/u appointments reviewed. No RX at this time. Daughter will transport pt.

## 2017-07-08 NOTE — NC FL2 (Signed)
[place] LEVEL OF CARE SCREENING TOOL     IDENTIFICATION  Patient Name: Nicole Arnold Birthdate: [birth year] Sex: female Admission Date (Current Location): 07/05/2017  [facility] and [place] Number:  Engineering geologist and Address:  [facility], [address], [place], [place] [address]      Provider Number: [id number]  Attending Physician Name and Address:  Henreitta Leber, MD  Relative Name and Phone Number:       Current Level of Care: Hospital Recommended Level of Care: [place] Prior Approval Number:    Date Approved/Denied:   PASRR Number:    Discharge Plan: SNF    Current Diagnoses: Patient Active Problem List   Diagnosis Date Noted  . SOB (shortness of breath) 07/05/2017  . Lymphedema 04/03/2016  . Chronic venous insufficiency 04/03/2016  . Pulmonary nodules 07/07/2015  . Pain of finger of left hand 01/07/2014  . OSA (obstructive sleep apnea) 09/07/2013  . Obesity, unspecified 09/07/2013  . Dyspnea 07/24/2013  . Emphysema lung ([place]) 07/24/2013  . Pulmonary hypertension ([place]) 07/24/2013  . Fatigue 07/24/2013    Orientation RESPIRATION BLADDER Height & Weight     Self, Time, Situation, Place  Normal Continent Weight: 175 lb (79.4 kg) Height:  5\' 3"  (160 cm)  BEHAVIORAL SYMPTOMS/MOOD NEUROLOGICAL BOWEL NUTRITION STATUS  (None) (None) Continent Diet(Regular)  AMBULATORY STATUS COMMUNICATION OF NEEDS Skin   Extensive Assist Verbally Normal                       Personal Care Assistance Level of Assistance  Bathing, Feeding, Dressing Bathing Assistance: Limited assistance Feeding assistance: Independent Dressing Assistance: Limited assistance     Functional Limitations Info  Sight, Hearing, Speech Sight Info: Adequate Hearing Info: Adequate Speech Info: Adequate    SPECIAL CARE FACTORS FREQUENCY  PT (By licensed PT), OT (By licensed OT)                    Contractures  Contractures Info: Not present    Additional Factors Info  Code Status, Allergies Code Status Info: Full Code  Allergies Info: Amoxicillin, Codeine, Neosporin , Sulfa Antibiotics           Current Medications (07/08/2017):  This is the current hospital active medication list Current Facility-Administered Medications  Medication Dose Route Frequency Provider Last Rate Last Dose  . acetaminophen (TYLENOL) tablet 650 mg  650 mg Oral Q6H PRN Gouru, Aruna, MD   650 mg at 07/06/17 1320   Or  . acetaminophen (TYLENOL) suppository 650 mg  650 mg Rectal Q6H PRN Gouru, Aruna, MD      . ALPRAZolam Duanne Moron) tablet 0.5 mg  0.5 mg Oral TID PRN Nicholes Mango, MD   0.5 mg at 07/08/17 0953  . amLODipine (NORVASC) tablet 5 mg  5 mg Oral Daily Nicholes Mango, MD   Stopped at 07/07/17 1118  . docusate sodium (COLACE) capsule 100 mg  100 mg Oral BID Gouru, Aruna, MD   100 mg at 07/07/17 2142  . enoxaparin (LOVENOX) injection 40 mg  40 mg Subcutaneous Q24H Napoleon Form, RPH      . irbesartan (AVAPRO) tablet 75 mg  75 mg Oral Daily Nicholes Mango, MD   Stopped at 07/07/17 1119  . levothyroxine (SYNTHROID, LEVOTHROID) tablet 50 mcg  50 mcg Oral QAC breakfast Nicholes Mango, MD   50 mcg at 07/08/17 0609  . multivitamin with minerals tablet 1 tablet  1 tablet Oral  Daily Nicholes Mango, MD   1 tablet at 07/08/17 [phone]  . ondansetron (ZOFRAN) tablet 4 mg  4 mg Oral Q6H PRN Gouru, Aruna, MD       Or  . ondansetron (ZOFRAN) injection 4 mg  4 mg Intravenous Q6H PRN Gouru, Aruna, MD      . pantoprazole (PROTONIX) EC tablet 40 mg  40 mg Oral QAC breakfast Gouru, Aruna, MD   40 mg at 07/08/17 0940  . PARoxetine (PAXIL) tablet 30 mg  30 mg Oral Daily Gouru, Aruna, MD   30 mg at 07/08/17 0940  . protein supplement (PREMIER PROTEIN) liquid  11 oz Oral BID BM Fritzi Mandes, MD   11 oz at 07/08/17 0942  . raloxifene (EVISTA) tablet 60 mg  60 mg Oral Daily Gouru, Aruna, MD   60 mg at 07/08/17 0940  . umeclidinium-vilanterol (ANORO  ELLIPTA) 62.5-25 MCG/INH 1 puff  1 puff Inhalation Daily Gouru, Aruna, MD   1 puff at 07/08/17 0940  . vitamin E capsule 400 Units  400 Units Oral Daily Nicholes Mango, MD   400 Units at 07/08/17 0940     Discharge Medications: Please see discharge summary for a list of discharge medications.  Relevant Imaging Results:  Relevant Lab Results:   Additional Information    Sulaiman Imbert  Louretta Shorten, LCSWA

## 2017-07-08 NOTE — Care Management Important Message (Signed)
Copy of signed IM left with patient in room.  

## 2017-07-08 NOTE — Discharge Summary (Signed)
[facility] [place] NAME: Nicole Arnold    MR#:  [id number]  DATE OF BIRTH:  [birth year]  DATE OF ADMISSION:  07/05/2017 ADMITTING PHYSICIAN: Nicholes Mango, MD  DATE OF DISCHARGE: 07/08/2017  PRIMARY CARE PHYSICIAN: Idelle Crouch, MD    ADMISSION DIAGNOSIS:  Lung mass [R91.8] SOB (shortness of breath) [R06.02]  DISCHARGE DIAGNOSIS:  Active Problems:   SOB (shortness of breath)   SECONDARY DIAGNOSIS:   Past Medical History:  Diagnosis Date  . Cataract   . Chronic constipation   . GERD (gastroesophageal reflux disease)   . Osteopenia   . Pancreatitis     HOSPITAL COURSE:   Nicole Arnold a84 y.o.femalewith a known history of chronic pancreatitis, essential hypertension, hypothyroidism is presenting to the ED with a chief complaint of worsening of shortness of breath.   #Acute respiratory distress secondary to lung mass with large right-sided pleural effusion - status post thoracentesis right-sided with 1 L fluid removal. - weaned off O2 and now on Room Air doing well.   #Right upper lobe lung mass with a large pleural effusion with mediastinal lymphadenopathy on CT scan - s/p CT guided biopsy done on 07/07/17.   - Discussed w/ Pathology and Pre-lim results on the biopsy are consistent with non-small cell Lung CAncer.  - pt. Will follow up with Oncology as outpatient (Dr. Grayland Ormond) -MRI of the brain is negative for metastatic Disease.   #Pancreatic mass with past medical history of pancreatic pseudocyst -pt has had complex mass in the pancreas in 2015 as well, and no acute issue related to this presently.   #Essential hypertension - she will continue amlodipine,Micardis  #Hypothyroidism  - she will cont. Her synthroid.   # GERD - pt. Will cont. Her Prevacid.   # Anxiety - pt. Will cont. Her Xanax, Paxil.   D/C to [facility] today.   DISCHARGE CONDITIONS:   Stable.   CONSULTS OBTAINED:  Treatment  Team:  Lloyd Huger, MD  DRUG ALLERGIES:   Allergies  Allergen Reactions  . Amoxicillin     Has patient had a PCN reaction causing immediate rash, facial/tongue/throat swelling, SOB or lightheadedness with hypotension: Yes Has patient had a PCN reaction causing severe rash involving mucus membranes or skin necrosis: No Has patient had a PCN reaction that required hospitalization: Unknown Has patient had a PCN reaction occurring within the last 10 years: Unknown If all of the above answers are "NO", then may proceed with Cephalosporin use.   . Codeine     vomiting  . Neosporin [Neomycin-Bacitracin Zn-Polymyx]   . Sulfa Antibiotics Rash    DISCHARGE MEDICATIONS:   Allergies as of 07/08/2017      Reactions   Amoxicillin    Has patient had a PCN reaction causing immediate rash, facial/tongue/throat swelling, SOB or lightheadedness with hypotension: Yes Has patient had a PCN reaction causing severe rash involving mucus membranes or skin necrosis: No Has patient had a PCN reaction that required hospitalization: Unknown Has patient had a PCN reaction occurring within the last 10 years: Unknown If all of the above answers are "NO", then may proceed with Cephalosporin use.   Codeine    vomiting   Neosporin [neomycin-bacitracin Zn-polymyx]    Sulfa Antibiotics Rash      Medication List    STOP taking these medications   albuterol 108 (90 Base) MCG/ACT inhaler Commonly known as:  PROVENTIL HFA;VENTOLIN HFA     TAKE these medications   ALPRAZolam  0.5 MG tablet Commonly known as:  XANAX Take 0.5 mg by mouth 3 (three) times daily as needed for anxiety.   amLODipine 5 MG tablet Commonly known as:  NORVASC Take 5 mg by mouth daily.   aspirin 81 MG tablet Take 81 mg by mouth daily.   CALTRATE 600 PLUS-VIT D PO Take 1 tablet by mouth daily.   CENTRUM SILVER ADULT 50+ PO Take 1 tablet by mouth daily.   lansoprazole 30 MG capsule Commonly known as:  PREVACID Take 30  mg by mouth daily at 12 noon.   levothyroxine 50 MCG tablet Commonly known as:  SYNTHROID, LEVOTHROID Take 50 mcg by mouth daily before breakfast.   MICARDIS 40 MG tablet Generic drug:  telmisartan Take 40 mg by mouth daily.   PARoxetine 30 MG tablet Commonly known as:  PAXIL Take 30 mg by mouth daily.   raloxifene 60 MG tablet Commonly known as:  EVISTA Take 60 mg by mouth daily.   umeclidinium-vilanterol 62.5-25 MCG/INH Aepb Commonly known as:  ANORO ELLIPTA Inhale 1 puff daily into the lungs.   vitamin E 400 UNIT capsule Take 400 Units by mouth daily.         DISCHARGE INSTRUCTIONS:   DIET:  Cardiac diet  DISCHARGE CONDITION:  Stable  ACTIVITY:  Activity as tolerated  OXYGEN:  Home Oxygen: No.   Oxygen Delivery: room air  DISCHARGE LOCATION:  nursing home   If you experience worsening of your admission symptoms, develop shortness of breath, life threatening emergency, suicidal or homicidal thoughts you must seek medical attention immediately by calling 911 or calling your MD immediately  if symptoms less severe.  You Must read complete instructions/literature along with all the possible adverse reactions/side effects for all the Medicines you take and that have been prescribed to you. Take any new Medicines after you have completely understood and accpet all the possible adverse reactions/side effects.   Please note  You were cared for by a hospitalist during your hospital stay. If you have any questions about your discharge medications or the care you received while you were in the hospital after you are discharged, you can call the unit and asked to speak with the hospitalist on call if the hospitalist that took care of you is not available. Once you are discharged, your primary care physician will handle any further medical issues. Please note that NO REFILLS for any discharge medications will be authorized once you are discharged, as it is imperative that  you return to your primary care physician (or establish a relationship with a primary care physician if you do not have one) for your aftercare needs so that they can reassess your need for medications and monitor your lab values.     Today   No acute events overnight. NO chest pain, shortness of breath or any other symptoms.  Will d/c to SNF today.   VITAL SIGNS:  Blood pressure (!) 127/56, pulse 80, temperature 98.4 F (36.9 C), temperature source Oral, resp. rate 18, height 5\' 3"  (1.6 m), weight 79.4 kg (175 lb), SpO2 94 %.  I/O:    Intake/Output Summary (Last 24 hours) at 07/08/2017 1211 Last data filed at 07/08/2017 0915 Gross per 24 hour  Intake 600 ml  Output -  Net 600 ml    PHYSICAL EXAMINATION:  GENERAL:  84 y.o.-year-old patient lying in the bed with no acute distress.  EYES: Pupils equal, round, reactive to light and accommodation. No scleral icterus. Extraocular muscles intact.  HEENT: Head atraumatic, normocephalic. Oropharynx and nasopharynx clear.  NECK:  Supple, no jugular venous distention. No thyroid enlargement, no tenderness.  LUNGS: Normal breath sounds bilaterally, no wheezing, rales,rhonchi. No use of accessory muscles of respiration.  CARDIOVASCULAR: S1, S2 normal. No murmurs, rubs, or gallops.  ABDOMEN: Soft, non-tender, non-distended. Bowel sounds present. No organomegaly or mass.  EXTREMITIES: No pedal edema, cyanosis, or clubbing.  NEUROLOGIC: Cranial nerves II through XII are intact. No focal motor or sensory defecits b/l.  PSYCHIATRIC: The patient is alert and oriented x 3. SKIN: No obvious rash, lesion, or ulcer.   DATA REVIEW:   CBC Recent Labs  Lab 07/06/17 0448  WBC 8.7  HGB 10.5*  HCT 30.4*  PLT 376    Chemistries  Recent Labs  Lab 07/06/17 0448  NA 133*  K 4.4  CL 103  CO2 22  GLUCOSE 94  BUN 19  CREATININE 0.84  CALCIUM 8.2*  AST 17  ALT 12*  ALKPHOS 44  BILITOT 0.4    Cardiac Enzymes Recent Labs  Lab  07/05/17 1329  TROPONINI <0.03    Microbiology Results  Results for orders placed or performed during the hospital encounter of 07/05/17  Body fluid culture     Status: None (Preliminary result)   Collection Time: 07/06/17  8:38 AM  Result Value Ref Range Status   Specimen Description   Final    PLEURAL Performed at [facility], [address], [place], [place] [address]    Special Requests   Final    PLEURAL Performed at [facility], [address] [place], [place] [address]    Gram Stain   Final    FEW WBC PRESENT, PREDOMINANTLY MONONUCLEAR NO ORGANISMS SEEN    Culture   Final    NO GROWTH 2 DAYS Performed at [facility], [place] [address] [place], [place] [address]    Report Status PENDING  Incomplete    RADIOLOGY:  Mr Jeri Cos DU Contrast  Result Date: 07/06/2017 CLINICAL DATA:  Lung mass.  Staging for metastatic disease. EXAM: MRI HEAD WITHOUT AND WITH CONTRAST TECHNIQUE: Multiplanar, multiecho pulse sequences of the brain and surrounding structures were obtained without and with intravenous contrast. CONTRAST:  43mL MULTIHANCE GADOBENATE DIMEGLUMINE 529 MG/ML IV SOLN COMPARISON:  CT head 05/28/2016 FINDINGS: Brain: Negative for acute infarct. Negative for metastatic disease. No enhancing mass lesion. Mild atrophy and mild chronic microvascular ischemic change in the white matter. Negative for hemorrhage mass or edema. Vascular: Normal arterial flow void Skull and upper cervical spine: Negative Sinuses/Orbits: Negative Other: None IMPRESSION: Negative for metastatic disease Mild atrophy and mild chronic microvascular ischemic change in the white matter. Electronically Signed   By: Franchot Gallo M.D.   On: 07/06/2017 15:14   Ct Biopsy  Result Date: 07/07/2017 INDICATION: 84 year old female with right upper lobe pulmonary nodule. She presents for CT-guided lung biopsy. EXAM: CT-guided biopsy right upper lobe pulmonary nodule Interventional  Radiologist:  Criselda Peaches, MD MEDICATIONS: None. ANESTHESIA/SEDATION: Fentanyl 75 mcg IV; Versed 1.5 mg IV Moderate Sedation Time:  26 minutes The patient was continuously monitored during the procedure by the interventional radiology nurse under my direct supervision. FLUOROSCOPY TIME:  Fluoroscopy Time: 0 minutes 0 seconds (0 mGy). COMPLICATIONS: None immediate. Estimated blood loss:  0 PROCEDURE: Informed written consent was obtained from the patient after a thorough discussion of the procedural risks, benefits and alternatives. All questions were addressed. Maximal Sterile Barrier Technique was utilized including caps, mask, sterile  gowns, sterile gloves, sterile drape, hand hygiene and skin antiseptic. A timeout was performed prior to the initiation of the procedure. A planning axial CT scan was performed. The nodule in the right upper lobe was successfully identified. A suitable skin entry site was selected and marked. The region was then sterilely prepped and draped in standard fashion using Betadine skin prep. Local anesthesia was attained by infiltration with 1% lidocaine. A small dermatotomy was made. Under intermittent CT fluoroscopic guidance, a 17 gauge trocar needle was advanced into the lung and positioned at the margin of the nodule. Multiple 18 gauge core biopsies were then coaxially obtained using the BioPince automated biopsy device. Biopsy specimens were placed in formalin and delivered to pathology for further analysis. The biopsy device and introducer needle were removed. Post biopsy axial CT imaging demonstrates no evidence of immediate complication. There is no pneumothorax. Mild perilesional alveolar hemorrhage is not unexpected. The patient tolerated the procedure well. IMPRESSION: Technically successful CT-guided biopsy right upper lobe pulmonary nodule. Electronically Signed   By: Jacqulynn Cadet M.D.   On: 07/07/2017 17:27   Dg Chest Port 1 View  Result Date:  07/07/2017 CLINICAL DATA:  Status post right lung biopsy. EXAM: PORTABLE CHEST 1 VIEW COMPARISON:  CT chest 07/05/2017 FINDINGS: Right upper lobe pulmonary mass is again noted. Small right pleural effusion. No right pneumothorax status post biopsy. Left lung is clear. Stable cardiomediastinal silhouette. No acute osseous abnormality. IMPRESSION: No right pneumothorax status post right lung biopsy. Persistent right upper lobe mass. Persistent stable right pleural effusion. Electronically Signed   By: Kathreen Devoid   On: 07/07/2017 17:31      Management plans discussed with the patient, family and they are in agreement.  CODE STATUS:     Code Status Orders  (From admission, onward)        Start     Ordered   07/05/17 2038  Full code  Continuous     07/05/17 2037    Code Status History    This patient has a current code status but no historical code status.    TOTAL TIME TAKING CARE OF THIS PATIENT: 40 minutes.    Henreitta Leber M.D on 07/08/2017 at 12:11 PM  Between 7am to 6pm - Pager - [phone]  After 6pm go to [email or web address] - Proofreader  [facility]  Office  [phone]  CC: Primary care physician; Idelle Crouch, MD

## 2017-07-08 NOTE — Clinical Social Work Note (Signed)
Patient is medically ready for discharge today. CSW notified patient and daughter in room. CSW also notified Seth Bake at [facility]. Patient's daughter will transport to facility.RN to call report.   [place], [place]

## 2017-07-08 NOTE — Clinical Social Work Note (Signed)
Clinical Social Work Assessment  Patient Details  Name: Nicole Arnold MRN: [id number] Date of Birth: 15-May-1928  Date of referral:  07/08/17               Reason for consult:  Facility Placement                Permission sought to share information with:  Case Manager, Customer service manager, Family Supports Permission granted to share information::     Name::        Agency::     Relationship::     Contact Information:     Housing/Transportation Living arrangements for the past 2 months:  Charity fundraiser of Information:  Patient Patient Interpreter Needed:  None Criminal Activity/Legal Involvement Pertinent to Current Situation/Hospitalization:  No - Comment as needed Significant Relationships:  Adult Children, Spouse, Community Support Lives with:  Spouse Do you feel safe going back to the place where you live?  No Need for family participation in patient care:  Yes (Comment)  Care giving concerns:  [facility] a resident at [facility].    Social Worker assessment / plan:  CSW received call from Nicole Arnold at [facility]. Nicole Arnold states that patient is from [facility] independent living and family is requesting skilled nursing placement. CSW met with patient and daughter Nicole Arnold at bedside. CSW introduced self and role. Patient states that she lives in independent living with her husband at [facility]. She states that she is the main caregiver for husband as well. Daughter reports that patient has been getting weaker over the past couple of weeks. Daughter also states that her father (patient's husband) is at a skilled nursing facility as well and he will be transferred to [facility] on Monday. Daughter states that patient and husband were in a cottage but are currently transitioning to an apartment at [facility]. Patient and daughter are in agreement to go to SNF. CSW will follow for discharge planning.   Employment status:   Retired Forensic scientist:  Medicare PT Recommendations:  Not assessed at this time Information / Referral to community resources:     Patient/Family's Response to care:  Patient thanked CSW for assistance   Patient/Family's Understanding of and Emotional Response to Diagnosis, Current Treatment, and Prognosis:  Patient and family are in agreement with plan   Emotional Assessment Appearance:  Appears stated age Attitude/Demeanor/Rapport:    Affect (typically observed):  Accepting, Calm, Pleasant Orientation:  Oriented to Self, Oriented to Place, Oriented to  Time, Oriented to Situation Alcohol / Substance use:  Not Applicable Psych involvement (Current and /or in the community):  No (Comment)  Discharge Needs  Concerns to be addressed:  Discharge Planning Concerns Readmission within the last 30 days:  No Current discharge risk:    Barriers to Discharge:  No Barriers Identified   Nicole Arnold, [place] 07/08/2017, 11:07 AM

## 2017-07-09 LAB — BODY FLUID CULTURE: Culture: NO GROWTH

## 2017-07-11 ENCOUNTER — Other Ambulatory Visit: Payer: Self-pay | Admitting: Oncology

## 2017-07-11 ENCOUNTER — Ambulatory Visit
Admission: RE | Admit: 2017-07-11 | Discharge: 2017-07-11 | Disposition: A | Discharge status: Home / Self Care | Payer: No Typology Code available for payment source | Source: Ambulatory Visit | Attending: Oncology | Admitting: Oncology

## 2017-07-11 DIAGNOSIS — J90 Pleural effusion, not elsewhere classified: Secondary | ICD-10-CM | POA: Diagnosis not present

## 2017-07-11 DIAGNOSIS — C3491 Malignant neoplasm of unspecified part of right bronchus or lung: Secondary | ICD-10-CM | POA: Insufficient documentation

## 2017-07-11 DIAGNOSIS — R590 Localized enlarged lymph nodes: Secondary | ICD-10-CM | POA: Insufficient documentation

## 2017-07-11 DIAGNOSIS — Z79890 Hormone replacement therapy (postmenopausal): Secondary | ICD-10-CM | POA: Diagnosis not present

## 2017-07-11 DIAGNOSIS — Z79899 Other long term (current) drug therapy: Secondary | ICD-10-CM | POA: Insufficient documentation

## 2017-07-11 DIAGNOSIS — Z7982 Long term (current) use of aspirin: Secondary | ICD-10-CM | POA: Insufficient documentation

## 2017-07-11 DIAGNOSIS — R918 Other nonspecific abnormal finding of lung field: Secondary | ICD-10-CM | POA: Insufficient documentation

## 2017-07-11 DIAGNOSIS — K8689 Other specified diseases of pancreas: Secondary | ICD-10-CM | POA: Insufficient documentation

## 2017-07-11 DIAGNOSIS — C782 Secondary malignant neoplasm of pleura: Secondary | ICD-10-CM | POA: Diagnosis not present

## 2017-07-11 DIAGNOSIS — C3411 Malignant neoplasm of upper lobe, right bronchus or lung: Secondary | ICD-10-CM | POA: Diagnosis present

## 2017-07-11 LAB — GLUCOSE, CAPILLARY: Glucose-Capillary: 84 mg/dL (ref 65–99)

## 2017-07-11 LAB — SURGICAL PATHOLOGY

## 2017-07-11 MED ORDER — FLUDEOXYGLUCOSE F - 18 (FDG) INJECTION
9.3700 | Freq: Once | INTRAVENOUS | Status: AC | PRN
  Administered 2017-07-11: 9.37 via INTRAVENOUS

## 2017-07-11 NOTE — Progress Notes (Signed)
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  INTERVAL HISTORY: Patient returns to clinic today for hospital follow-up, discussion of her pathology and imaging results, and treatment planning.  She continues to have significant weakness and fatigue, but otherwise feels well.  She admits to mild depression and anxiety.  She has no neurologic complaints.  She denies any recent fevers.  She has a poor appetite, but denies weight loss.  She has no chest pain or shortness of breath.  She denies any nausea, vomiting, constipation, or diarrhea.  She has no urinary complaints.  Patient offers no further specific complaints today.  REVIEW OF SYSTEMS:   Review of Systems  Constitutional: Positive for malaise/fatigue. Negative for fever and weight loss.  Respiratory: Negative.  Negative for cough and shortness of breath.   Cardiovascular: Negative.  Negative for chest pain and leg swelling.  Gastrointestinal: Negative.  Negative for abdominal pain.  Genitourinary: Negative.  Negative for dysuria.  Musculoskeletal: Negative.  Negative for back pain.  Skin: Negative.  Negative for rash.  Neurological: Positive for weakness. Negative for sensory change, focal weakness and headaches.  Psychiatric/Behavioral: Positive for depression. The patient is nervous/anxious.     As per HPI. Otherwise, a complete review of systems is negative.  PAST MEDICAL HISTORY: Past Medical History:  Diagnosis Date  . Cataract   . Chronic constipation   . GERD (gastroesophageal reflux disease)   . Osteopenia   . Pancreatitis     PAST SURGICAL HISTORY: Past Surgical History:  Procedure Laterality Date  .  APPENDECTOMY  1949  . CATARACT EXTRACTION Right 1987  . CATARACT EXTRACTION Left 1986  . detached retina repair Right 1991  . OOPHORECTOMY  1959   right ovary, part of left  . RETINAL DETACHMENT SURGERY Left 1997  . uterine hysterectomy  1978    FAMILY HISTORY: Family History  Problem Relation Age of Onset  . Breast cancer Neg Hx     ADVANCED DIRECTIVES (Y/N):  N  HEALTH MAINTENANCE: Social History   Tobacco Use  . Smoking status: Former Smoker    Packs/day: 0.50    Years: 30.00    Pack years: 15.00    Types: Cigarettes    Last attempt to quit: 01/25/1993    Years since quitting: 24.4  . Smokeless tobacco: Never Used  Substance Use Topics  . Alcohol use: No  . Drug use: No     Colonoscopy:  PAP:  Bone density:  Lipid panel:  Allergies  Allergen Reactions  . Amoxicillin     Has patient had a PCN reaction causing immediate rash, facial/tongue/throat swelling, SOB or lightheadedness with hypotension: Yes Has patient had a PCN reaction causing severe rash involving mucus membranes or skin necrosis: No Has patient had a PCN reaction that required hospitalization: Unknown Has patient had a PCN reaction occurring within the last 10 years: Unknown If all of the above answers are "NO", then may proceed with Cephalosporin use.   . Codeine     vomiting  . Neosporin [Neomycin-Bacitracin Zn-Polymyx]   . Sulfa Antibiotics Rash    Current Outpatient Medications  Medication Sig Dispense Refill  . ALPRAZolam (XANAX) 0.5 MG tablet Take 0.5 mg by mouth 3 (three) times  daily as needed for anxiety.    Marland Kitchen aspirin 81 MG tablet Take 81 mg by mouth daily.    . lansoprazole (PREVACID) 30 MG capsule Take 30 mg by mouth daily at 12 noon.    Marland Kitchen levothyroxine (SYNTHROID, LEVOTHROID) 50 MCG tablet Take 50 mcg by mouth daily before breakfast.    . MICARDIS 40 MG tablet Take 40 mg by mouth daily.    Marland Kitchen PARoxetine (PAXIL) 30 MG tablet Take 30 mg by mouth daily.     Marland Kitchen umeclidinium-vilanterol  (ANORO ELLIPTA) 62.5-25 MCG/INH AEPB Inhale 1 puff daily into the lungs. 180 each 3  . amLODipine (NORVASC) 5 MG tablet Take 5 mg by mouth daily.     . Calcium-Vitamin D (CALTRATE 600 PLUS-VIT D PO) Take 1 tablet by mouth daily.     . megestrol (MEGACE) 40 MG tablet Take 1 tablet (40 mg total) by mouth daily. 30 tablet 0  . Multiple Vitamins-Minerals (CENTRUM SILVER ADULT 50+ PO) Take 1 tablet by mouth daily.    . raloxifene (EVISTA) 60 MG tablet Take 60 mg by mouth daily.    . vitamin E 400 UNIT capsule Take 400 Units by mouth daily.     No current facility-administered medications for this visit.    Facility-Administered Medications Ordered in Other Visits  Medication Dose Route Frequency Provider Last Rate Last Dose  . clindamycin (CLEOCIN) IVPB 300 mg  300 mg Intravenous Once Stegmayer, Kimberly A, PA-C        OBJECTIVE: Vitals:   07/14/17 1353  BP: 103/65  Pulse: 85  Resp: 16  Temp: 97.6 F (36.4 C)     Body mass index is 33.83 kg/m.    ECOG FS:1 - Symptomatic but completely ambulatory  General: Well-developed, well-nourished, no acute distress. Eyes: Pink conjunctiva, anicteric sclera. HEENT: Normocephalic, moist mucous membranes, clear oropharnyx. Lungs: Clear to auscultation bilaterally. Heart: Regular rate and rhythm. No rubs, murmurs, or gallops. Abdomen: Soft, nontender, nondistended. No organomegaly noted, normoactive bowel sounds. Musculoskeletal: No edema, cyanosis, or clubbing. Neuro: Alert, answering all questions appropriately. Cranial nerves grossly intact. Skin: No rashes or petechiae noted. Psych: Normal affect. Lymphatics: No cervical, calvicular, axillary or inguinal LAD.   LAB RESULTS:  Lab Results  Component Value Date   NA 133 (L) 07/06/2017   K 4.4 07/06/2017   CL 103 07/06/2017   CO2 22 07/06/2017   GLUCOSE 94 07/06/2017   BUN 19 07/06/2017   CREATININE 0.84 07/06/2017   CALCIUM 8.2 (L) 07/06/2017   PROT 6.4 (L) 07/06/2017   ALBUMIN 2.9  (L) 07/06/2017   AST 17 07/06/2017   ALT 12 (L) 07/06/2017   ALKPHOS 44 07/06/2017   BILITOT 0.4 07/06/2017   GFRNONAA >60 07/06/2017   GFRAA >60 07/06/2017    Lab Results  Component Value Date   WBC 8.7 07/06/2017   NEUTROABS 6.6 (H) 05/28/2016   HGB 10.5 (L) 07/06/2017   HCT 30.4 (L) 07/06/2017   MCV 82.2 07/06/2017   PLT 376 07/06/2017     STUDIES: Dg Chest 1 View  Result Date: 07/06/2017 CLINICAL DATA:  Status post RIGHT-sided thoracentesis. 1 L of fluid removed. EXAM: CHEST  1 VIEW COMPARISON:  Chest x-ray dated 07/05/2017. FINDINGS: Improved aeration at the RIGHT lung base status post thoracentesis. No pneumothorax seen. Stable appearance of the RIGHT upper lobe mass in the short-term interval. Stable cardiomegaly. Overall cardiomediastinal silhouette is stable. IMPRESSION: No residual pleural effusion appreciated status post RIGHT-sided thoracentesis. No pneumothorax or other procedural complicating features  seen. Electronically Signed   By: Franki Cabot M.D.   On: 07/06/2017 09:04   Dg Chest 2 View  Result Date: 07/05/2017 CLINICAL DATA:  Shortness of breath today. Malaise for 1 month. Former smoker. EXAM: CHEST - 2 VIEW COMPARISON:  Chest radiographs 05/28/2016. Chest CT 04/01/2015. Esophagram 11/19/2016. FINDINGS: The heart size and mediastinal contours are stable. There is aortic atherosclerosis. Right subpulmonic pleural effusion has enlarged compared with the prior radiographs. Peripheral mid right lung density on the frontal examination is new and could reflect extension of fluid into the fissure. This projects over the aortic arch on the lateral view. There is mildly increased right basilar pulmonary opacity. The left lung is clear. Multiple healed rib fractures are present on the right. Left thoracotomy defect and probable remote rib fractures are also noted. IMPRESSION: Compared with 2018 radiographs, there are new pleuroparenchymal opacities within the right hemithorax  which may be related to the previously demonstrated right chest wall injury and rib fractures. Other considerations include pneumonia and neoplasm. Further evaluation with chest CT recommended. Electronically Signed   By: Richardean Sale M.D.   On: 07/05/2017 14:41   Ct Chest W Contrast  Result Date: 07/05/2017 CLINICAL DATA:  Shortness of breath and abnormal chest x-ray. Known pancreatic mass. EXAM: CT CHEST WITH CONTRAST TECHNIQUE: Multidetector CT imaging of the chest was performed during intravenous contrast administration. CONTRAST:  68mL OMNIPAQUE IOHEXOL 300 MG/ML  SOLN COMPARISON:  Chest x-ray 07/05/2017 and chest CT 04/01/2015 FINDINGS: Cardiovascular: The heart is normal in size. No pericardial effusion. The aorta is normal in caliber. Scattered atherosclerotic calcifications. No dissection. The branch vessels are patent. Three-vessel coronary artery calcifications are noted. Mediastinum/Nodes: Mediastinal and hilar adenopathy. Index right hilar node on image number 63 measures 14 mm. 8.5 mm precarinal lymph node on image number 54. 10 mm subcarinal lymph node on image number 67. 9.5 mm left infrahilar lymph node on image number 71. 8 mm epicardial lymph node on image number 98. Lungs/Pleura: Large right upper lobe lung mass measures 4.9 x 3.1 x 3.6 cm. This is most consistent with a primary lung neoplasm. 8.5 mm subpleural nodular density in the left upper lobe on image number 20 is unchanged since 2017 and likely benign. Spiculated 11 mm right upper lobe lesion on image number 69 could be a metastatic focus or a second primary. 7 mm pulmonary nodule in the right upper lobe adjacent to the major fissure on image number 70 suspicious for metastasis. Moderate to large right pleural effusion, likely malignant. No obvious enhancing pleural nodules. Upper Abdomen: There is a large complex cystic and solid mass projecting off of the upper aspect of the pancreatic body and into the lesser sac. Measures  approximately 9.2 x 8.5 cm. It was also present on a prior CT scan from 2015 where it measured 5.5 x 5.1 cm. Scattered small surrounding lymph nodes are noted. Stable low-attenuation lesion in the left hepatic lobe, consistent with benign hepatic cysts. The gallbladder is contracted. Musculoskeletal: No significant bony findings. No obvious metastatic bone disease. IMPRESSION: 1. Large right upper lobe lung mass along with smaller right lung lesions as detailed above. These could be satellite lesions, metastasis or synchronous cancers. Metastasis is also possible given the large pancreatic mass but I think that is much less likely. 2. Mediastinal and hilar lymphadenopathy. 3. Moderate to large right pleural effusion, likely pathologic. 4. 9.2 x 8.5 cm pancreatic mass, enlarged since prior studies. 5. PET-CT may be helpful for further evaluation  and staging purposes. Aortic Atherosclerosis (ICD10-I70.0) and Emphysema (ICD10-J43.9). Electronically Signed   By: Marijo Sanes M.D.   On: 07/05/2017 15:49   Mr Jeri Cos UX Contrast  Result Date: 07/06/2017 CLINICAL DATA:  Lung mass.  Staging for metastatic disease. EXAM: MRI HEAD WITHOUT AND WITH CONTRAST TECHNIQUE: Multiplanar, multiecho pulse sequences of the brain and surrounding structures were obtained without and with intravenous contrast. CONTRAST:  92mL MULTIHANCE GADOBENATE DIMEGLUMINE 529 MG/ML IV SOLN COMPARISON:  CT head 05/28/2016 FINDINGS: Brain: Negative for acute infarct. Negative for metastatic disease. No enhancing mass lesion. Mild atrophy and mild chronic microvascular ischemic change in the white matter. Negative for hemorrhage mass or edema. Vascular: Normal arterial flow void Skull and upper cervical spine: Negative Sinuses/Orbits: Negative Other: None IMPRESSION: Negative for metastatic disease Mild atrophy and mild chronic microvascular ischemic change in the white matter. Electronically Signed   By: Franchot Gallo M.D.   On: 07/06/2017 15:14    Nm Pet Image Initial (pi) Skull Base To Thigh  Result Date: 07/11/2017 CLINICAL DATA:  Initial treatment strategy for non-small cell lung cancer. EXAM: NUCLEAR MEDICINE PET SKULL BASE TO THIGH TECHNIQUE: 9.37 mCi F-18 FDG was injected intravenously. Full-ring PET imaging was performed from the skull base to thigh after the radiotracer. CT data was obtained and used for attenuation correction and anatomic localization. Fasting blood glucose: 84 mg/dl COMPARISON:  Chest CT 07/05/2017 FINDINGS: Mediastinal blood pool activity: SUV max 2.28 NECK: No hypermetabolic lymph nodes in the neck. Incidental CT findings: Carotid artery calcifications. CHEST: The large right upper lobe lung mass is markedly hypermetabolic with SUV max of 32.44. A more medial right upper lobe pulmonary nodule measures 7 mm on image number 82 and has SUV max of 8.08. 14 mm right middle lobe pulmonary nodule has SUV max of 3.4 and a slightly more posteriorly located right middle lobe lesion has an SUV max of 3.25. Other smaller pulmonary nodules are below the limits of PET CT. Moderate to large right pleural effusion with areas of mild pleural hypermetabolism highly suspicious for pleural metastasis. 16 mm lower right pleural lesion on image number 135 has an SUV max of 18.23. Right hilar and mediastinal lymphadenopathy. 14 mm right hilar lymph node has an SUV max of 4.88. 9.5 mm left hilar node has an SUV max of 5.12. 8.5 mm precarinal lymph node has an SUV max of 4.0. Incidental CT findings: none ABDOMEN/PELVIS: Large pancreatic mass projecting into the lesser sac is hypermetabolic with SUV max of 0.10. Small left adrenal gland nodule is mildly hypermetabolic with SUV max of 2.72. No evidence of hepatic metastatic disease. No enlarged or hypermetabolic mesenteric or retroperitoneal lymph nodes. Incidental CT findings: none SKELETON: No focal hypermetabolic activity to suggest skeletal metastasis. Incidental CT findings: none IMPRESSION: 1.  Large right upper lobe pulmonary mass is markedly hypermetabolic and consistent with known neoplasm. 2. Other pulmonary nodules are also hypermetabolic and likely metastatic disease. 3. Mediastinal and hilar lymphadenopathy. 4. Moderate-sized right pleural effusion with pleural metastatic disease. 5. Large cystic pancreatic mass is hypermetabolic and consistent with neoplasm. Electronically Signed   By: Marijo Sanes M.D.   On: 07/11/2017 14:21   Ct Biopsy  Result Date: 07/07/2017 INDICATION: 84 year old female with right upper lobe pulmonary nodule. She presents for CT-guided lung biopsy. EXAM: CT-guided biopsy right upper lobe pulmonary nodule Interventional Radiologist:  Criselda Peaches, MD MEDICATIONS: None. ANESTHESIA/SEDATION: Fentanyl 75 mcg IV; Versed 1.5 mg IV Moderate Sedation Time:  26 minutes The patient  was continuously monitored during the procedure by the interventional radiology nurse under my direct supervision. FLUOROSCOPY TIME:  Fluoroscopy Time: 0 minutes 0 seconds (0 mGy). COMPLICATIONS: None immediate. Estimated blood loss:  0 PROCEDURE: Informed written consent was obtained from the patient after a thorough discussion of the procedural risks, benefits and alternatives. All questions were addressed. Maximal Sterile Barrier Technique was utilized including caps, mask, sterile gowns, sterile gloves, sterile drape, hand hygiene and skin antiseptic. A timeout was performed prior to the initiation of the procedure. A planning axial CT scan was performed. The nodule in the right upper lobe was successfully identified. A suitable skin entry site was selected and marked. The region was then sterilely prepped and draped in standard fashion using Betadine skin prep. Local anesthesia was attained by infiltration with 1% lidocaine. A small dermatotomy was made. Under intermittent CT fluoroscopic guidance, a 17 gauge trocar needle was advanced into the lung and positioned at the margin of the  nodule. Multiple 18 gauge core biopsies were then coaxially obtained using the BioPince automated biopsy device. Biopsy specimens were placed in formalin and delivered to pathology for further analysis. The biopsy device and introducer needle were removed. Post biopsy axial CT imaging demonstrates no evidence of immediate complication. There is no pneumothorax. Mild perilesional alveolar hemorrhage is not unexpected. The patient tolerated the procedure well. IMPRESSION: Technically successful CT-guided biopsy right upper lobe pulmonary nodule. Electronically Signed   By: Jacqulynn Cadet M.D.   On: 07/07/2017 17:27   Dg Chest Port 1 View  Result Date: 07/07/2017 CLINICAL DATA:  Status post right lung biopsy. EXAM: PORTABLE CHEST 1 VIEW COMPARISON:  CT chest 07/05/2017 FINDINGS: Right upper lobe pulmonary mass is again noted. Small right pleural effusion. No right pneumothorax status post biopsy. Left lung is clear. Stable cardiomediastinal silhouette. No acute osseous abnormality. IMPRESSION: No right pneumothorax status post right lung biopsy. Persistent right upper lobe mass. Persistent stable right pleural effusion. Electronically Signed   By: Kathreen Devoid   On: 07/07/2017 17:31   US Thoracentesis Asp Pleural Space W/img Guide  Result Date: 07/06/2017 CLINICAL DATA:  Right lung mass, pleural effusion EXAM: ULTRASOUND GUIDED RIGHT THORACENTESIS COMPARISON:  07/05/2017 PROCEDURE: An ultrasound guided thoracentesis was thoroughly discussed with the patient and questions answered. The benefits, risks, alternatives and complications were also discussed. The patient understands and wishes to proceed with the procedure. Written consent was obtained. Ultrasound was performed to localize and mark an adequate pocket of fluid in the right chest. The area was then prepped and draped in the normal sterile fashion. 1% Lidocaine was used for local anesthesia. Under ultrasound guidance a Safe-T-Centesis needle catheter  was introduced. Thoracentesis was performed. The catheter was removed and a dressing applied. Complications:  None immediate FINDINGS: A total of approximately 1 L of serosanguineous amber pleural fluid was removed. A fluid sample wassent for laboratory analysis. IMPRESSION: Successful ultrasound guided right thoracentesis yielding 1 L of pleural fluid. Electronically Signed   By: Jerilynn Mages.  Shick M.D.   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  Imaging and pathology results are reviewed extensively.  Patient was also discussed at cancer conference.  She will benefit from concurrent chemotherapy along with XRT and  a referral has been made to radiation oncology.  When she completes her treatment, she will also benefit from maintenance Imfinzi every 2 weeks for up to 12 months.  Patient is anxious to initiate treatment, therefore she will return to clinic in 1 week on July 21, 2017 to proceed with cycle 1 of weekly carboplatinum and Taxol. 2.  Poor appetite: Patient was given a prescription for Megace. 3.  Anxiety: Continue Xanax as prescribed.  I spent a total of 30 minutes face-to-face with the patient of which greater than 50% of the visit was spent in counseling and coordination of care as summarized above.  Patient expressed understanding and was in agreement with this plan. She also understands that She can call clinic at any time with any questions, concerns, or complaints.   Cancer Staging Squamous cell carcinoma lung, right ([place]) Staging form: Lung, AJCC 8th Edition - Clinical stage from 07/18/2017: Stage IIIA (cT2b, cN2, cM0) - Signed by Lloyd Huger, MD on 07/18/2017   Lloyd Huger, MD   07/18/2017 7:58  AM

## 2017-07-11 NOTE — Progress Notes (Unsigned)
mdt

## 2017-07-12 DIAGNOSIS — K219 Gastro-esophageal reflux disease without esophagitis: Secondary | ICD-10-CM | POA: Diagnosis not present

## 2017-07-12 DIAGNOSIS — K869 Disease of pancreas, unspecified: Secondary | ICD-10-CM | POA: Diagnosis not present

## 2017-07-12 DIAGNOSIS — J439 Emphysema, unspecified: Secondary | ICD-10-CM

## 2017-07-12 DIAGNOSIS — I10 Essential (primary) hypertension: Secondary | ICD-10-CM | POA: Diagnosis not present

## 2017-07-12 DIAGNOSIS — C3490 Malignant neoplasm of unspecified part of unspecified bronchus or lung: Secondary | ICD-10-CM

## 2017-07-12 DIAGNOSIS — F39 Unspecified mood [affective] disorder: Secondary | ICD-10-CM | POA: Diagnosis not present

## 2017-07-14 ENCOUNTER — Other Ambulatory Visit (INDEPENDENT_AMBULATORY_CARE_PROVIDER_SITE_OTHER): Payer: Self-pay | Admitting: Vascular Surgery

## 2017-07-14 ENCOUNTER — Encounter: Payer: Self-pay | Admitting: *Deleted

## 2017-07-14 ENCOUNTER — Inpatient Hospital Stay: Payer: Medicare Other | Attending: Oncology | Admitting: Oncology

## 2017-07-14 ENCOUNTER — Encounter (INDEPENDENT_AMBULATORY_CARE_PROVIDER_SITE_OTHER): Payer: Self-pay

## 2017-07-14 ENCOUNTER — Encounter: Payer: Self-pay | Admitting: Oncology

## 2017-07-14 VITALS — BP 103/65 | HR 85 | Temp 97.6°F | Resp 16 | Wt 191.0 lb

## 2017-07-14 DIAGNOSIS — K5909 Other constipation: Secondary | ICD-10-CM | POA: Diagnosis not present

## 2017-07-14 DIAGNOSIS — I10 Essential (primary) hypertension: Secondary | ICD-10-CM | POA: Diagnosis not present

## 2017-07-14 DIAGNOSIS — I872 Venous insufficiency (chronic) (peripheral): Secondary | ICD-10-CM | POA: Diagnosis not present

## 2017-07-14 DIAGNOSIS — E871 Hypo-osmolality and hyponatremia: Secondary | ICD-10-CM | POA: Insufficient documentation

## 2017-07-14 DIAGNOSIS — R590 Localized enlarged lymph nodes: Secondary | ICD-10-CM

## 2017-07-14 DIAGNOSIS — E860 Dehydration: Secondary | ICD-10-CM | POA: Diagnosis not present

## 2017-07-14 DIAGNOSIS — M8580 Other specified disorders of bone density and structure, unspecified site: Secondary | ICD-10-CM | POA: Insufficient documentation

## 2017-07-14 DIAGNOSIS — C3411 Malignant neoplasm of upper lobe, right bronchus or lung: Secondary | ICD-10-CM | POA: Diagnosis present

## 2017-07-14 DIAGNOSIS — K869 Disease of pancreas, unspecified: Secondary | ICD-10-CM | POA: Insufficient documentation

## 2017-07-14 DIAGNOSIS — Z803 Family history of malignant neoplasm of breast: Secondary | ICD-10-CM | POA: Insufficient documentation

## 2017-07-14 DIAGNOSIS — R5383 Other fatigue: Secondary | ICD-10-CM | POA: Insufficient documentation

## 2017-07-14 DIAGNOSIS — R531 Weakness: Secondary | ICD-10-CM | POA: Diagnosis not present

## 2017-07-14 DIAGNOSIS — K219 Gastro-esophageal reflux disease without esophagitis: Secondary | ICD-10-CM | POA: Diagnosis not present

## 2017-07-14 DIAGNOSIS — Z79899 Other long term (current) drug therapy: Secondary | ICD-10-CM | POA: Diagnosis not present

## 2017-07-14 DIAGNOSIS — J449 Chronic obstructive pulmonary disease, unspecified: Secondary | ICD-10-CM | POA: Diagnosis not present

## 2017-07-14 DIAGNOSIS — R630 Anorexia: Secondary | ICD-10-CM | POA: Diagnosis not present

## 2017-07-14 DIAGNOSIS — I700 Atherosclerosis of aorta: Secondary | ICD-10-CM | POA: Insufficient documentation

## 2017-07-14 DIAGNOSIS — Z87891 Personal history of nicotine dependence: Secondary | ICD-10-CM | POA: Insufficient documentation

## 2017-07-14 DIAGNOSIS — F418 Other specified anxiety disorders: Secondary | ICD-10-CM | POA: Insufficient documentation

## 2017-07-14 DIAGNOSIS — E039 Hypothyroidism, unspecified: Secondary | ICD-10-CM | POA: Insufficient documentation

## 2017-07-14 DIAGNOSIS — Z5111 Encounter for antineoplastic chemotherapy: Secondary | ICD-10-CM | POA: Diagnosis present

## 2017-07-14 DIAGNOSIS — J90 Pleural effusion, not elsewhere classified: Secondary | ICD-10-CM | POA: Diagnosis not present

## 2017-07-14 DIAGNOSIS — C3491 Malignant neoplasm of unspecified part of right bronchus or lung: Secondary | ICD-10-CM

## 2017-07-14 DIAGNOSIS — F419 Anxiety disorder, unspecified: Secondary | ICD-10-CM | POA: Diagnosis not present

## 2017-07-14 MED ORDER — MEGESTROL ACETATE 40 MG PO TABS: 40.0000 mg | ORAL_TABLET | Freq: Every day | ORAL | 0 refills | Status: AC

## 2017-07-15 NOTE — Progress Notes (Signed)
  Oncology Nurse Navigator Documentation  Navigator Location: [facility]-Med Onc (07/14/17 1400)   )Navigator Encounter Type: Initial MedOnc (07/14/17 1400)   Abnormal Finding Date: 07/05/17 (07/14/17 1400) Confirmed Diagnosis Date: 07/11/17 (07/14/17 1400)                 Treatment Phase: Pre-Tx/Tx Discussion (07/14/17 1400) Barriers/Navigation Needs: Education;Coordination of Care (07/14/17 1400) Education: Understanding Cancer/ Treatment Options;Newly Diagnosed Cancer Education (07/14/17 1400) Interventions: Coordination of Care;Education (07/14/17 1400)   Coordination of Care: Appts;Chemo (07/14/17 1400) Education Method: Verbal;Written (07/14/17 1400)      Acuity: Level 2 (07/14/17 1400)   Acuity Level 2: Initial guidance, education and coordination as needed;Educational needs;Assistance expediting appointments (07/14/17 1400)  met with patient and family during hospital follow up visit with Dr. Grayland Ormond. Dr. Grayland Ormond reviewed imaging and pathology results with patient and family. Treatment options discussed with patient. All questions answered at the time of visit. Pt given resources regarding diagnosis and supportive services available. Reviewed upcoming appts. Pt voiced concerns in starting treatment as soon as possible. Reassurance provided and informed pt that will schedule appts as quickly as possible to start treatment. Contact info given and instructed to call if has any further questions or needs. Pt and family verbalized understanding.    Time Spent with Patient: 60 (07/14/17 1400)

## 2017-07-16 LAB — ACID FAST SMEAR (AFB)

## 2017-07-16 LAB — ACID FAST SMEAR (AFB, MYCOBACTERIA): Acid Fast Smear: NEGATIVE

## 2017-07-17 MED ORDER — CLINDAMYCIN PHOSPHATE 300 MG/50ML IV SOLN: 300.0000 mg | Freq: Once | INTRAVENOUS | Status: DC

## 2017-07-18 ENCOUNTER — Encounter
Admission: RE | Disposition: A | Discharge status: Home / Self Care | Payer: Self-pay | Source: Ambulatory Visit | Attending: Vascular Surgery

## 2017-07-18 ENCOUNTER — Ambulatory Visit
Admission: RE | Admit: 2017-07-18 | Discharge: 2017-07-18 | Disposition: A | Discharge status: Home / Self Care | Payer: Medicare Other | Source: Ambulatory Visit | Attending: Vascular Surgery | Admitting: Vascular Surgery

## 2017-07-18 DIAGNOSIS — Z87891 Personal history of nicotine dependence: Secondary | ICD-10-CM | POA: Insufficient documentation

## 2017-07-18 DIAGNOSIS — Z882 Allergy status to sulfonamides status: Secondary | ICD-10-CM | POA: Diagnosis not present

## 2017-07-18 DIAGNOSIS — Z79899 Other long term (current) drug therapy: Secondary | ICD-10-CM | POA: Diagnosis not present

## 2017-07-18 DIAGNOSIS — C3490 Malignant neoplasm of unspecified part of unspecified bronchus or lung: Secondary | ICD-10-CM

## 2017-07-18 DIAGNOSIS — Z90721 Acquired absence of ovaries, unilateral: Secondary | ICD-10-CM | POA: Diagnosis not present

## 2017-07-18 DIAGNOSIS — Z885 Allergy status to narcotic agent status: Secondary | ICD-10-CM | POA: Insufficient documentation

## 2017-07-18 DIAGNOSIS — C3491 Malignant neoplasm of unspecified part of right bronchus or lung: Secondary | ICD-10-CM | POA: Insufficient documentation

## 2017-07-18 DIAGNOSIS — K219 Gastro-esophageal reflux disease without esophagitis: Secondary | ICD-10-CM | POA: Insufficient documentation

## 2017-07-18 DIAGNOSIS — Z9842 Cataract extraction status, left eye: Secondary | ICD-10-CM | POA: Diagnosis not present

## 2017-07-18 DIAGNOSIS — Z880 Allergy status to penicillin: Secondary | ICD-10-CM | POA: Insufficient documentation

## 2017-07-18 DIAGNOSIS — Z9841 Cataract extraction status, right eye: Secondary | ICD-10-CM | POA: Diagnosis not present

## 2017-07-18 DIAGNOSIS — Z888 Allergy status to other drugs, medicaments and biological substances status: Secondary | ICD-10-CM | POA: Insufficient documentation

## 2017-07-18 DIAGNOSIS — Z9889 Other specified postprocedural states: Secondary | ICD-10-CM | POA: Insufficient documentation

## 2017-07-18 DIAGNOSIS — Z7982 Long term (current) use of aspirin: Secondary | ICD-10-CM | POA: Insufficient documentation

## 2017-07-18 DIAGNOSIS — M8580 Other specified disorders of bone density and structure, unspecified site: Secondary | ICD-10-CM | POA: Diagnosis not present

## 2017-07-18 DIAGNOSIS — Z90711 Acquired absence of uterus with remaining cervical stump: Secondary | ICD-10-CM | POA: Diagnosis not present

## 2017-07-18 DIAGNOSIS — Z79890 Hormone replacement therapy (postmenopausal): Secondary | ICD-10-CM | POA: Diagnosis not present

## 2017-07-18 DIAGNOSIS — H269 Unspecified cataract: Secondary | ICD-10-CM | POA: Insufficient documentation

## 2017-07-18 HISTORY — PX: PORTA CATH INSERTION: CATH118285

## 2017-07-18 SURGERY — PORTA CATH INSERTION
Anesthesia: Moderate Sedation

## 2017-07-18 MED ORDER — LIDOCAINE-EPINEPHRINE (PF) 1 %-1:200000 IJ SOLN
INTRAMUSCULAR | Status: AC
  Filled 2017-07-18: qty 30

## 2017-07-18 MED ORDER — ONDANSETRON HCL 8 MG PO TABS
8.0000 mg | ORAL_TABLET | Freq: Two times a day (BID) | ORAL | 2 refills | Status: DC | PRN
Start: 2017-07-18 — End: 2017-07-19

## 2017-07-18 MED ORDER — CEFAZOLIN SODIUM-DEXTROSE 2-4 GM/100ML-% IV SOLN
INTRAVENOUS | Status: AC
  Filled 2017-07-18: qty 100

## 2017-07-18 MED ORDER — SODIUM CHLORIDE 0.9 % IV SOLN
INTRAVENOUS | Status: DC
  Administered 2017-07-18: 14:00:00 via INTRAVENOUS

## 2017-07-18 MED ORDER — SODIUM CHLORIDE 0.9 % IV SOLN
Freq: Once | INTRAVENOUS | Status: AC
  Administered 2017-07-18: 15:00:00
  Filled 2017-07-18: qty 80

## 2017-07-18 MED ORDER — CLINDAMYCIN PHOSPHATE 300 MG/50ML IV SOLN
INTRAVENOUS | Status: AC
  Administered 2017-07-18: 15:00:00
  Filled 2017-07-18: qty 50

## 2017-07-18 MED ORDER — FENTANYL CITRATE (PF) 100 MCG/2ML IJ SOLN
INTRAMUSCULAR | Status: AC
  Filled 2017-07-18: qty 2

## 2017-07-18 MED ORDER — PROCHLORPERAZINE MALEATE 10 MG PO TABS: 10.0000 mg | ORAL_TABLET | Freq: Four times a day (QID) | ORAL | 2 refills | Status: DC | PRN

## 2017-07-18 MED ORDER — HEPARIN (PORCINE) IN NACL 1000-0.9 UT/500ML-% IV SOLN
INTRAVENOUS | Status: AC
  Filled 2017-07-18: qty 500

## 2017-07-18 MED ORDER — MIDAZOLAM HCL 2 MG/2ML IJ SOLN
INTRAMUSCULAR | Status: DC | PRN
  Administered 2017-07-18 (×2): 1 mg via INTRAVENOUS

## 2017-07-18 MED ORDER — HYDROMORPHONE HCL 1 MG/ML IJ SOLN: 1.0000 mg | Freq: Once | INTRAMUSCULAR | Status: DC | PRN

## 2017-07-18 MED ORDER — LIDOCAINE-EPINEPHRINE (PF) 1 %-1:200000 IJ SOLN
INTRAMUSCULAR | Status: DC | PRN
  Administered 2017-07-18: 20 mL

## 2017-07-18 MED ORDER — LIDOCAINE-PRILOCAINE 2.5-2.5 % EX CREA: TOPICAL_CREAM | CUTANEOUS | 3 refills | Status: DC

## 2017-07-18 MED ORDER — FENTANYL CITRATE (PF) 100 MCG/2ML IJ SOLN
INTRAMUSCULAR | Status: DC | PRN
  Administered 2017-07-18 (×3): 25 ug via INTRAVENOUS

## 2017-07-18 MED ORDER — MIDAZOLAM HCL 2 MG/2ML IJ SOLN
INTRAMUSCULAR | Status: AC
  Filled 2017-07-18: qty 4

## 2017-07-18 MED ORDER — ONDANSETRON HCL 4 MG/2ML IJ SOLN: 4.0000 mg | Freq: Four times a day (QID) | INTRAMUSCULAR | Status: DC | PRN

## 2017-07-18 SURGICAL SUPPLY — 8 items
KIT PORT POWER 8FR ISP CVUE (Port) ×3 IMPLANT
PACK ANGIOGRAPHY (CUSTOM PROCEDURE TRAY) ×3 IMPLANT
PAD GROUND ADULT SPLIT (MISCELLANEOUS) ×3 IMPLANT
PENCIL ELECTRO HAND CTR (MISCELLANEOUS) ×3 IMPLANT
SUT MNCRL AB 4-0 PS2 18 (SUTURE) ×3 IMPLANT
SUT PROLENE 0 CT 1 30 (SUTURE) ×3 IMPLANT
SUT VIC AB 3-0 SH 27 (SUTURE) ×2
SUT VIC AB 3-0 SH 27X BRD (SUTURE) ×1 IMPLANT

## 2017-07-18 NOTE — Patient Instructions (Signed)

## 2017-07-18 NOTE — Progress Notes (Signed)
START ON PATHWAY REGIMEN - Non-Small Cell Lung     Administer weekly:     Paclitaxel      Carboplatin   **Always confirm dose/schedule in your pharmacy ordering system**  Patient Characteristics: Stage III - Unresectable, PS = 0, 1 AJCC T Category: T2b Current Disease Status: No Distant Mets or Local Recurrence AJCC N Category: N2 AJCC M Category: M0 AJCC 8 Stage Grouping: IIIA Performance Status: PS = 0, 1 Intent of Therapy: Curative Intent, Discussed with Patient

## 2017-07-18 NOTE — H&P (Signed)
[facility] History & Physical Update  The patient was interviewed and re-examined.  The patient's previous History and Physical has been reviewed and is unchanged.  There is no change in the plan of care. We plan to proceed with the scheduled procedure.  Leotis Pain, MD  07/18/2017, 1:44 PM

## 2017-07-18 NOTE — Op Note (Signed)
      [facility]       Operative Note  Date: 07/18/2017  Preoperative diagnosis:  1. Lung cancer  Postoperative diagnosis:  Same as above  Procedures: #1. Ultrasound guidance for vascular access to the right internal jugular vein. #2. Fluoroscopic guidance for placement of catheter. #3. Placement of CT compatible Port-A-Cath, right internal jugular vein.  Surgeon: Leotis Pain, MD.   Anesthesia: Local with moderate conscious sedation for approximately 20 minutes using 2 mg of Versed and 75 mcg of Fentanyl  Fluoroscopy time: less than 1 minute  Contrast used: 0  Estimated blood loss: 5 cc  Indication for the procedure:  The patient is a 84 y.o.female with lung cancer.  The patient needs a Port-A-Cath for durable venous access, chemotherapy, lab draws, and CT scans. We are asked to place this. Risks and benefits were discussed and informed consent was obtained.  Description of procedure: The patient was brought to the vascular and interventional radiology suite.  Moderate conscious sedation was administered throughout the procedure during a face to face encounter with the patient with my supervision of the RN administering medicines and monitoring the patient's vital signs, pulse oximetry, telemetry and mental status throughout from the start of the procedure until the patient was taken to the recovery room. The right neck chest and shoulder were sterilely prepped and draped, and a sterile surgical field was created. Ultrasound was used to help visualize a patent right internal jugular vein. This was then accessed under direct ultrasound guidance without difficulty with the Seldinger needle and a permanent image was recorded. A J-wire was placed. After skin nick and dilatation, the peel-away sheath was then placed over the wire. I then anesthetized an area under the clavicle approximately 1-2 fingerbreadths. A transverse incision was created and an inferior pocket was  created with electrocautery and blunt dissection. The port was then brought onto the field, placed into the pocket and secured to the chest wall with 2 Prolene sutures. The catheter was connected to the port and tunneled from the subclavicular incision to the access site. Fluoroscopic guidance was then used to cut the catheter to an appropriate length. The catheter was then placed through the peel-away sheath and the peel-away sheath was removed. The catheter tip was parked in excellent location under fluorocoscopic guidance in the cavoatrial junction. The pocket was then irrigated with antibiotic impregnated saline and the wound was closed with a running 3-0 Vicryl and a 4-0 Monocryl. The access incision was closed with a single 4-0 Monocryl. The Huber needle was used to withdraw blood and flush the port with heparinized saline. Dermabond was then placed as a dressing. The patient tolerated the procedure well and was taken to the recovery room in stable condition.   Leotis Pain 07/18/2017 3:21 PM   This note was created with Dragon Medical transcription system. Any errors in dictation are purely unintentional.

## 2017-07-18 NOTE — Progress Notes (Signed)
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  INTERVAL HISTORY: Patient returns to clinic today for further evaluation and initiation of weekly carboplatinum and Taxol.  She continues to have chronic weakness and fatigue.  She noticed increasing shortness of breath this past week, and chest x-ray suggest the reaccumulation of her pleural fluid.  She otherwise feels well. She has no neurologic complaints.  She denies any recent fevers.  She has a poor appetite, but denies weight loss.  She denies any chest pain, cough, or hemoptysis.  She denies any nausea, vomiting, constipation, or diarrhea.  She has no urinary complaints.  Patient offers no further specific complaints today.  REVIEW OF SYSTEMS:   Review of Systems  Constitutional: Positive for malaise/fatigue. Negative for fever and weight loss.  Respiratory: Positive for shortness of breath. Negative for cough.   Cardiovascular: Negative.  Negative for chest pain and leg swelling.  Gastrointestinal: Negative.  Negative for abdominal pain.  Genitourinary: Negative.  Negative for dysuria.  Musculoskeletal: Negative.  Negative for back pain.  Skin: Negative.  Negative for rash.  Neurological: Positive for weakness. Negative for sensory change, focal weakness and headaches.  Psychiatric/Behavioral: Negative for depression. The patient is nervous/anxious.     As per HPI. Otherwise, a complete review of systems is negative.  PAST MEDICAL HISTORY: Past Medical History:  Diagnosis Date  . Cataract   . Chronic constipation   . Chronic venous insufficiency   . COPD (chronic obstructive pulmonary disease) ([place])   . Essential  hypertension, benign   . GERD (gastroesophageal reflux disease)   . Hypothyroidism   . Mood disorder ([place])    mostly anxiety  . Non-small cell lung cancer with metastasis ([place])   . Osteopenia   . Pancreatic mass   . Pancreatitis since 1961   chronic    PAST SURGICAL HISTORY: Past Surgical History:  Procedure Laterality Date  . APPENDECTOMY  1949  . CATARACT EXTRACTION Right 1987  . CATARACT EXTRACTION Left 1986  . detached retina repair Right 1991  . OOPHORECTOMY  1959   right ovary, part of left  . PORTA CATH INSERTION N/A 07/18/2017   Procedure: PORTA CATH INSERTION;  Surgeon: Algernon Huxley, MD;  Location: [facility];  Service: Cardiovascular;  Laterality: N/A;  . RETINAL DETACHMENT SURGERY Left 1997  . THORACOTOMY Left 2008   due to pneumonia  . uterine hysterectomy  1978    FAMILY HISTORY: Family History  Problem Relation Age of Onset  . Breast cancer Neg Hx     ADVANCED DIRECTIVES (Y/N):  N  HEALTH MAINTENANCE: Social History   Tobacco Use  . Smoking status: Former Smoker    Packs/day: 0.50    Years: 30.00    Pack years: 15.00    Types: Cigarettes    Last attempt to quit: 01/25/1993    Years since quitting: 24.5  . Smokeless tobacco: Never Used  Substance Use Topics  . Alcohol use: No  . Drug use: No     Colonoscopy:  PAP:  Bone density:  Lipid panel:  Allergies  Allergen Reactions  . Amoxicillin     Has patient had a PCN reaction causing immediate rash, facial/tongue/throat swelling, SOB or lightheadedness  with hypotension: Yes Has patient had a PCN reaction causing severe rash involving mucus membranes or skin necrosis: No Has patient had a PCN reaction that required hospitalization: Unknown Has patient had a PCN reaction occurring within the last 10 years: Unknown If all of the above answers are "NO", then may proceed with Cephalosporin use.   . Codeine     vomiting  . Neosporin [Neomycin-Bacitracin Zn-Polymyx]   . Sulfa Antibiotics  Rash    Current Outpatient Medications  Medication Sig Dispense Refill  . ALPRAZolam (XANAX) 0.5 MG tablet Take 0.5 mg by mouth 3 (three) times daily as needed for anxiety.    . lansoprazole (PREVACID) 30 MG capsule Take 30 mg by mouth daily at 12 noon.    Marland Kitchen levothyroxine (SYNTHROID, LEVOTHROID) 50 MCG tablet Take 50 mcg by mouth daily before breakfast.    . lidocaine-prilocaine (EMLA) cream Apply to affected area once 30 g 2  . megestrol (MEGACE) 40 MG tablet Take 1 tablet (40 mg total) by mouth daily. 30 tablet 0  . MICARDIS 40 MG tablet Take 40 mg by mouth daily.    . ondansetron (ZOFRAN) 8 MG tablet Take 1 tablet (8 mg total) by mouth 2 (two) times daily as needed for refractory nausea / vomiting. 30 tablet 2  . PARoxetine (PAXIL) 30 MG tablet Take 30 mg by mouth daily.     . prochlorperazine (COMPAZINE) 10 MG tablet Take 1 tablet (10 mg total) by mouth every 6 (six) hours as needed (Nausea or vomiting). 60 tablet 2  . umeclidinium-vilanterol (ANORO ELLIPTA) 62.5-25 MCG/INH AEPB Inhale 1 puff daily into the lungs. 180 each 3   No current facility-administered medications for this visit.     OBJECTIVE: Vitals:   07/21/17 0929  BP: 117/72  Pulse: 82  Resp: 18  Temp: (!) 97.2 F (36.2 C)     Body mass index is 30.82 kg/m.    ECOG FS:2 - Symptomatic, <50% confined to bed  General: Thin, no acute distress, sitting in a wheelchair. Eyes: Pink conjunctiva, anicteric sclera. HEENT: Normocephalic, moist mucous membranes, clear oropharnyx. Lungs: Diminished breath sounds on right. Heart: Regular rate and rhythm. No rubs, murmurs, or gallops. Abdomen: Soft, nontender, nondistended. No organomegaly noted, normoactive bowel sounds. Musculoskeletal: No edema, cyanosis, or clubbing. Neuro: Alert, answering all questions appropriately. Cranial nerves grossly intact. Skin: No rashes or petechiae noted. Psych: Normal affect.  LAB RESULTS:  Lab Results  Component Value Date   NA 127 (L)  07/21/2017   K 4.9 07/21/2017   CL 99 07/21/2017   CO2 19 (L) 07/21/2017   GLUCOSE 102 (H) 07/21/2017   BUN 20 07/21/2017   CREATININE 0.93 07/21/2017   CALCIUM 8.5 (L) 07/21/2017   PROT 6.6 07/21/2017   ALBUMIN 2.8 (L) 07/21/2017   AST 16 07/21/2017   ALT 12 07/21/2017   ALKPHOS 52 07/21/2017   BILITOT 0.3 07/21/2017   GFRNONAA 54 (L) 07/21/2017   GFRAA >60 07/21/2017    Lab Results  Component Value Date   WBC 12.4 (H) 07/21/2017   NEUTROABS 10.4 (H) 07/21/2017   HGB 10.7 (L) 07/21/2017   HCT 31.5 (L) 07/21/2017   MCV 80.9 07/21/2017   PLT 426 07/21/2017     STUDIES: Dg Chest 1 View  Result Date: 07/06/2017 CLINICAL DATA:  Status post RIGHT-sided thoracentesis. 1 L of fluid removed. EXAM: CHEST  1 VIEW COMPARISON:  Chest x-ray dated 07/05/2017. FINDINGS: Improved aeration at the RIGHT lung base status post thoracentesis. No pneumothorax  seen. Stable appearance of the RIGHT upper lobe mass in the short-term interval. Stable cardiomegaly. Overall cardiomediastinal silhouette is stable. IMPRESSION: No residual pleural effusion appreciated status post RIGHT-sided thoracentesis. No pneumothorax or other procedural complicating features seen. Electronically Signed   By: Franki Cabot M.D.   On: 07/06/2017 09:04   Dg Chest 2 View  Result Date: 07/20/2017 CLINICAL DATA:  Pt having SOB still after having fluid drawn off of her right lung last week. Hx of right lung cancer, pneumonia, port a cath. Former smoker. She starts chemo tomorrow. EXAM: CHEST - 2 VIEW COMPARISON:  CT exam 07/11/2017 and chest x-ray 07/07/2017 FINDINGS: Patient has a RIGHT-sided PowerPort, tip overlying the level of the superior vena cava. No pneumothorax. RIGHT pleural effusion appears to have re-accumulated since thoracentesis, and is similar in volume compared to prior PET-CT. Persistent RIGHT UPPER lobe mass. History of LEFT thoracotomy. Multiple healed rib fractures are present. IMPRESSION: 1. RIGHT pleural  effusion similar to prior PET-CT exam, consistent with reaccumulation following thoracentesis. 2. RIGHT UPPER lobe mass. Electronically Signed   By: Nolon Nations M.D.   On: 07/20/2017 16:20   Dg Chest 2 View  Result Date: 07/05/2017 CLINICAL DATA:  Shortness of breath today. Malaise for 1 month. Former smoker. EXAM: CHEST - 2 VIEW COMPARISON:  Chest radiographs 05/28/2016. Chest CT 04/01/2015. Esophagram 11/19/2016. FINDINGS: The heart size and mediastinal contours are stable. There is aortic atherosclerosis. Right subpulmonic pleural effusion has enlarged compared with the prior radiographs. Peripheral mid right lung density on the frontal examination is new and could reflect extension of fluid into the fissure. This projects over the aortic arch on the lateral view. There is mildly increased right basilar pulmonary opacity. The left lung is clear. Multiple healed rib fractures are present on the right. Left thoracotomy defect and probable remote rib fractures are also noted. IMPRESSION: Compared with 2018 radiographs, there are new pleuroparenchymal opacities within the right hemithorax which may be related to the previously demonstrated right chest wall injury and rib fractures. Other considerations include pneumonia and neoplasm. Further evaluation with chest CT recommended. Electronically Signed   By: Richardean Sale M.D.   On: 07/05/2017 14:41   Ct Chest W Contrast  Result Date: 07/05/2017 CLINICAL DATA:  Shortness of breath and abnormal chest x-ray. Known pancreatic mass. EXAM: CT CHEST WITH CONTRAST TECHNIQUE: Multidetector CT imaging of the chest was performed during intravenous contrast administration. CONTRAST:  49mL OMNIPAQUE IOHEXOL 300 MG/ML  SOLN COMPARISON:  Chest x-ray 07/05/2017 and chest CT 04/01/2015 FINDINGS: Cardiovascular: The heart is normal in size. No pericardial effusion. The aorta is normal in caliber. Scattered atherosclerotic calcifications. No dissection. The branch vessels  are patent. Three-vessel coronary artery calcifications are noted. Mediastinum/Nodes: Mediastinal and hilar adenopathy. Index right hilar node on image number 63 measures 14 mm. 8.5 mm precarinal lymph node on image number 54. 10 mm subcarinal lymph node on image number 67. 9.5 mm left infrahilar lymph node on image number 71. 8 mm epicardial lymph node on image number 98. Lungs/Pleura: Large right upper lobe lung mass measures 4.9 x 3.1 x 3.6 cm. This is most consistent with a primary lung neoplasm. 8.5 mm subpleural nodular density in the left upper lobe on image number 20 is unchanged since 2017 and likely benign. Spiculated 11 mm right upper lobe lesion on image number 69 could be a metastatic focus or a second primary. 7 mm pulmonary nodule in the right upper lobe adjacent to the major fissure on image  number 70 suspicious for metastasis. Moderate to large right pleural effusion, likely malignant. No obvious enhancing pleural nodules. Upper Abdomen: There is a large complex cystic and solid mass projecting off of the upper aspect of the pancreatic body and into the lesser sac. Measures approximately 9.2 x 8.5 cm. It was also present on a prior CT scan from 2015 where it measured 5.5 x 5.1 cm. Scattered small surrounding lymph nodes are noted. Stable low-attenuation lesion in the left hepatic lobe, consistent with benign hepatic cysts. The gallbladder is contracted. Musculoskeletal: No significant bony findings. No obvious metastatic bone disease. IMPRESSION: 1. Large right upper lobe lung mass along with smaller right lung lesions as detailed above. These could be satellite lesions, metastasis or synchronous cancers. Metastasis is also possible given the large pancreatic mass but I think that is much less likely. 2. Mediastinal and hilar lymphadenopathy. 3. Moderate to large right pleural effusion, likely pathologic. 4. 9.2 x 8.5 cm pancreatic mass, enlarged since prior studies. 5. PET-CT may be helpful for  further evaluation and staging purposes. Aortic Atherosclerosis (ICD10-I70.0) and Emphysema (ICD10-J43.9). Electronically Signed   By: Marijo Sanes M.D.   On: 07/05/2017 15:49   Mr Jeri Cos [place] Contrast  Result Date: 07/06/2017 CLINICAL DATA:  Lung mass.  Staging for metastatic disease. EXAM: MRI HEAD WITHOUT AND WITH CONTRAST TECHNIQUE: Multiplanar, multiecho pulse sequences of the brain and surrounding structures were obtained without and with intravenous contrast. CONTRAST:  86mL MULTIHANCE GADOBENATE DIMEGLUMINE 529 MG/ML IV SOLN COMPARISON:  CT head 05/28/2016 FINDINGS: Brain: Negative for acute infarct. Negative for metastatic disease. No enhancing mass lesion. Mild atrophy and mild chronic microvascular ischemic change in the white matter. Negative for hemorrhage mass or edema. Vascular: Normal arterial flow void Skull and upper cervical spine: Negative Sinuses/Orbits: Negative Other: None IMPRESSION: Negative for metastatic disease Mild atrophy and mild chronic microvascular ischemic change in the white matter. Electronically Signed   By: Franchot Gallo M.D.   On: 07/06/2017 15:14   Nm Pet Image Initial (pi) Skull Base To Thigh  Result Date: 07/11/2017 CLINICAL DATA:  Initial treatment strategy for non-small cell lung cancer. EXAM: NUCLEAR MEDICINE PET SKULL BASE TO THIGH TECHNIQUE: 9.37 mCi F-18 FDG was injected intravenously. Full-ring PET imaging was performed from the skull base to thigh after the radiotracer. CT data was obtained and used for attenuation correction and anatomic localization. Fasting blood glucose: 84 mg/dl COMPARISON:  Chest CT 07/05/2017 FINDINGS: Mediastinal blood pool activity: SUV max 2.28 NECK: No hypermetabolic lymph nodes in the neck. Incidental CT findings: Carotid artery calcifications. CHEST: The large right upper lobe lung mass is markedly hypermetabolic with SUV max of 70.62. A more medial right upper lobe pulmonary nodule measures 7 mm on image number 82 and has SUV  max of 8.08. 14 mm right middle lobe pulmonary nodule has SUV max of 3.4 and a slightly more posteriorly located right middle lobe lesion has an SUV max of 3.25. Other smaller pulmonary nodules are below the limits of PET CT. Moderate to large right pleural effusion with areas of mild pleural hypermetabolism highly suspicious for pleural metastasis. 16 mm lower right pleural lesion on image number 135 has an SUV max of 18.23. Right hilar and mediastinal lymphadenopathy. 14 mm right hilar lymph node has an SUV max of 4.88. 9.5 mm left hilar node has an SUV max of 5.12. 8.5 mm precarinal lymph node has an SUV max of 4.0. Incidental CT findings: none ABDOMEN/PELVIS: Large pancreatic mass projecting into  the lesser sac is hypermetabolic with SUV max of 4.09. Small left adrenal gland nodule is mildly hypermetabolic with SUV max of 8.11. No evidence of hepatic metastatic disease. No enlarged or hypermetabolic mesenteric or retroperitoneal lymph nodes. Incidental CT findings: none SKELETON: No focal hypermetabolic activity to suggest skeletal metastasis. Incidental CT findings: none IMPRESSION: 1. Large right upper lobe pulmonary mass is markedly hypermetabolic and consistent with known neoplasm. 2. Other pulmonary nodules are also hypermetabolic and likely metastatic disease. 3. Mediastinal and hilar lymphadenopathy. 4. Moderate-sized right pleural effusion with pleural metastatic disease. 5. Large cystic pancreatic mass is hypermetabolic and consistent with neoplasm. Electronically Signed   By: Marijo Sanes M.D.   On: 07/11/2017 14:21   Ct Biopsy  Result Date: 07/07/2017 INDICATION: 84 year old female with right upper lobe pulmonary nodule. She presents for CT-guided lung biopsy. EXAM: CT-guided biopsy right upper lobe pulmonary nodule Interventional Radiologist:  Criselda Peaches, MD MEDICATIONS: None. ANESTHESIA/SEDATION: Fentanyl 75 mcg IV; Versed 1.5 mg IV Moderate Sedation Time:  26 minutes The patient was  continuously monitored during the procedure by the interventional radiology nurse under my direct supervision. FLUOROSCOPY TIME:  Fluoroscopy Time: 0 minutes 0 seconds (0 mGy). COMPLICATIONS: None immediate. Estimated blood loss:  0 PROCEDURE: Informed written consent was obtained from the patient after a thorough discussion of the procedural risks, benefits and alternatives. All questions were addressed. Maximal Sterile Barrier Technique was utilized including caps, mask, sterile gowns, sterile gloves, sterile drape, hand hygiene and skin antiseptic. A timeout was performed prior to the initiation of the procedure. A planning axial CT scan was performed. The nodule in the right upper lobe was successfully identified. A suitable skin entry site was selected and marked. The region was then sterilely prepped and draped in standard fashion using Betadine skin prep. Local anesthesia was attained by infiltration with 1% lidocaine. A small dermatotomy was made. Under intermittent CT fluoroscopic guidance, a 17 gauge trocar needle was advanced into the lung and positioned at the margin of the nodule. Multiple 18 gauge core biopsies were then coaxially obtained using the BioPince automated biopsy device. Biopsy specimens were placed in formalin and delivered to pathology for further analysis. The biopsy device and introducer needle were removed. Post biopsy axial CT imaging demonstrates no evidence of immediate complication. There is no pneumothorax. Mild perilesional alveolar hemorrhage is not unexpected. The patient tolerated the procedure well. IMPRESSION: Technically successful CT-guided biopsy right upper lobe pulmonary nodule. Electronically Signed   By: Jacqulynn Cadet M.D.   On: 07/07/2017 17:27   Dg Chest Port 1 View  Result Date: 07/07/2017 CLINICAL DATA:  Status post right lung biopsy. EXAM: PORTABLE CHEST 1 VIEW COMPARISON:  CT chest 07/05/2017 FINDINGS: Right upper lobe pulmonary mass is again noted.  Small right pleural effusion. No right pneumothorax status post biopsy. Left lung is clear. Stable cardiomediastinal silhouette. No acute osseous abnormality. IMPRESSION: No right pneumothorax status post right lung biopsy. Persistent right upper lobe mass. Persistent stable right pleural effusion. Electronically Signed   By: Kathreen Devoid   On: 07/07/2017 17:31   US Thoracentesis Asp Pleural Space W/img Guide  Result Date: 07/06/2017 CLINICAL DATA:  Right lung mass, pleural effusion EXAM: ULTRASOUND GUIDED RIGHT THORACENTESIS COMPARISON:  07/05/2017 PROCEDURE: An ultrasound guided thoracentesis was thoroughly discussed with the patient and questions answered. The benefits, risks, alternatives and complications were also discussed. The patient understands and wishes to proceed with the procedure. Written consent was obtained. Ultrasound was performed to localize and mark an  adequate pocket of fluid in the right chest. The area was then prepped and draped in the normal sterile fashion. 1% Lidocaine was used for local anesthesia. Under ultrasound guidance a Safe-T-Centesis needle catheter was introduced. Thoracentesis was performed. The catheter was removed and a dressing applied. Complications:  None immediate FINDINGS: A total of approximately 1 L of serosanguineous amber pleural fluid was removed. A fluid sample wassent for laboratory analysis. IMPRESSION: Successful ultrasound guided right thoracentesis yielding 1 L of pleural fluid. Electronically Signed   By: Jerilynn Mages.  Shick M.D.   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  Imaging and pathology results are reviewed extensively.  Patient was also discussed at cancer conference.  She will  benefit from concurrent chemotherapy along with XRT.  Because of patient's age and decreased performance status, radiation oncology plans to do 10 fractions of IMRT rather than a 6-week course.  Patient will still proceed with concurrent chemotherapy and XRT, therefore we will proceed with cycle 1 of weekly carboplatinum and Taxol today.  Once she completes her treatment, she will also benefit from maintenance Imfinzi every 2 weeks for up to 12 months.  Return to clinic in 1 week for further evaluation and consideration of cycle 2. 2.  Shortness of breath: Likely secondary to pleural effusion.  Have reordered ultrasound for consideration of repeat thoracentesis. 3.  Poor appetite: Continue Megace as prescribed. 4.  Anxiety: Continue Xanax as prescribed. 5.  Hyponatremia: Likely secondary to underlying malignancy as well as mild dehydration.  Monitor.  Patient expressed understanding and was in agreement with this plan. She also understands that She can call clinic at any time with any questions, concerns, or complaints.   Cancer Staging Squamous cell carcinoma lung, right ([place]) Staging form: Lung, AJCC 8th Edition - Clinical stage from 07/18/2017: Stage IIIA (cT2b, cN2, cM0) - Signed by Lloyd Huger, MD on 07/18/2017   Lloyd Huger, MD   07/24/2017 8:51 AM

## 2017-07-19 ENCOUNTER — Encounter: Payer: Self-pay | Admitting: Vascular Surgery

## 2017-07-19 ENCOUNTER — Inpatient Hospital Stay: Payer: Medicare Other

## 2017-07-19 ENCOUNTER — Telehealth: Payer: Self-pay | Admitting: *Deleted

## 2017-07-19 DIAGNOSIS — C3491 Malignant neoplasm of unspecified part of right bronchus or lung: Secondary | ICD-10-CM

## 2017-07-19 DIAGNOSIS — R0602 Shortness of breath: Secondary | ICD-10-CM

## 2017-07-19 MED ORDER — PROCHLORPERAZINE MALEATE 10 MG PO TABS: 10.0000 mg | ORAL_TABLET | Freq: Four times a day (QID) | ORAL | 2 refills | Status: DC | PRN

## 2017-07-19 MED ORDER — LIDOCAINE-PRILOCAINE 2.5-2.5 % EX CREA: TOPICAL_CREAM | CUTANEOUS | 0 refills | Status: DC

## 2017-07-19 MED ORDER — ONDANSETRON HCL 8 MG PO TABS: 8.0000 mg | ORAL_TABLET | Freq: Two times a day (BID) | ORAL | 0 refills | Status: DC | PRN

## 2017-07-19 MED ORDER — ONDANSETRON HCL 8 MG PO TABS: 8.0000 mg | ORAL_TABLET | Freq: Two times a day (BID) | ORAL | 2 refills | Status: DC | PRN

## 2017-07-19 MED ORDER — LIDOCAINE-PRILOCAINE 2.5-2.5 % EX CREA: TOPICAL_CREAM | CUTANEOUS | 2 refills | Status: DC

## 2017-07-19 MED ORDER — PROCHLORPERAZINE MALEATE 10 MG PO TABS: 10.0000 mg | ORAL_TABLET | Freq: Four times a day (QID) | ORAL | 0 refills | Status: DC | PRN

## 2017-07-19 NOTE — Telephone Encounter (Signed)
Met with patient prior to chemo class this morning to inform that will call in prescriptions for chemo to [place] then [facility]. Pt states that has moved out of [facility] and lives with daughter in an apartment. Pt and daughter are concerned about her breathing stating that it has become more shallow and they are concerned that fluid is starting to accumulate. Informed pt's daughter that can order chest xray that they can get done after radiation appt tomorrow to evaluate fluid accumulation. Pt's daughter verbalized understanding.

## 2017-07-20 ENCOUNTER — Encounter: Payer: Self-pay | Admitting: Radiation Oncology

## 2017-07-20 ENCOUNTER — Other Ambulatory Visit: Payer: Self-pay

## 2017-07-20 ENCOUNTER — Ambulatory Visit
Admission: RE | Admit: 2017-07-20 | Discharge: 2017-07-20 | Disposition: A | Discharge status: Home / Self Care | Payer: Medicare Other | Source: Ambulatory Visit | Attending: Oncology | Admitting: Oncology

## 2017-07-20 ENCOUNTER — Other Ambulatory Visit: Payer: Self-pay | Admitting: *Deleted

## 2017-07-20 ENCOUNTER — Ambulatory Visit
Admission: RE | Admit: 2017-07-20 | Discharge: 2017-07-20 | Disposition: A | Discharge status: Home / Self Care | Payer: Medicare Other | Source: Ambulatory Visit | Attending: Radiation Oncology | Admitting: Radiation Oncology

## 2017-07-20 VITALS — BP 119/60 | HR 86 | Temp 97.3°F | Resp 18 | Wt 177.0 lb

## 2017-07-20 DIAGNOSIS — M8580 Other specified disorders of bone density and structure, unspecified site: Secondary | ICD-10-CM | POA: Insufficient documentation

## 2017-07-20 DIAGNOSIS — K869 Disease of pancreas, unspecified: Secondary | ICD-10-CM | POA: Insufficient documentation

## 2017-07-20 DIAGNOSIS — K5900 Constipation, unspecified: Secondary | ICD-10-CM | POA: Diagnosis not present

## 2017-07-20 DIAGNOSIS — C3491 Malignant neoplasm of unspecified part of right bronchus or lung: Secondary | ICD-10-CM | POA: Diagnosis not present

## 2017-07-20 DIAGNOSIS — R0602 Shortness of breath: Secondary | ICD-10-CM | POA: Diagnosis not present

## 2017-07-20 DIAGNOSIS — J90 Pleural effusion, not elsewhere classified: Secondary | ICD-10-CM | POA: Diagnosis not present

## 2017-07-20 DIAGNOSIS — Z79899 Other long term (current) drug therapy: Secondary | ICD-10-CM | POA: Diagnosis not present

## 2017-07-20 DIAGNOSIS — Z87891 Personal history of nicotine dependence: Secondary | ICD-10-CM | POA: Diagnosis not present

## 2017-07-20 DIAGNOSIS — C3490 Malignant neoplasm of unspecified part of unspecified bronchus or lung: Secondary | ICD-10-CM

## 2017-07-20 DIAGNOSIS — C3411 Malignant neoplasm of upper lobe, right bronchus or lung: Secondary | ICD-10-CM | POA: Insufficient documentation

## 2017-07-20 DIAGNOSIS — K219 Gastro-esophageal reflux disease without esophagitis: Secondary | ICD-10-CM | POA: Diagnosis not present

## 2017-07-20 DIAGNOSIS — R599 Enlarged lymph nodes, unspecified: Secondary | ICD-10-CM | POA: Diagnosis not present

## 2017-07-20 NOTE — Consult Note (Signed)
NEW PATIENT EVALUATION  Name: Nicole Arnold  MRN: [id number]  Date:   07/20/2017     DOB: [birth year]   This 84 y.o. 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  REFERRING PHYSICIAN: Idelle Crouch, MD  CHIEF COMPLAINT:  Chief Complaint  Patient presents with  . Lung Cancer    Pt is here for initial consultation of lung cancer    DIAGNOSIS: The encounter diagnosis was Squamous cell carcinoma lung, right ([place]).   PREVIOUS INVESTIGATIONS:  PET CT and CT scans reviewed Pathology reports reviewed Clinical notes reviewed  HPI: patient is a frail 84 year old female who presented to emergency room with increasing shortness of breath. She had a CT scan of the chest which showed a large right upper lobe lung mass with mediastinal and hilar adenopathy. She also a large right pleural effusion which was tapped and no malignant cells were seen.PET CT scan was performed again showing hypermetabolic large right upper lobe lesion consistent with known primary. There was mediastinal and hilar lymphadenopathy. She also a large cystic pancreatic mass which is hypermetabolic and 6 consistent with neoplasm.she does have a small satellite lesion again in the right upper lobe which is mildly hypermetabolic.CT-guided biopsy was positive for non-small cell lung cancer favoring squamous cell carcinoma.She continues to have some shortness of breathquite anxious accompanied by her daughter today. She specifically denies hemoptysis cough.brain MRI showed no evidence of metastatic disease.  PLANNED TREATMENT REGIMEN: hypofractionated course of radiation therapy with concurrent chemotherapy  PAST MEDICAL HISTORY:  has a past medical history of Cataract, Chronic constipation, GERD (gastroesophageal reflux disease), Osteopenia, and Pancreatitis.    PAST SURGICAL HISTORY:  Past Surgical History:  Procedure Laterality Date  .  APPENDECTOMY  1949  . CATARACT EXTRACTION Right 1987  . CATARACT EXTRACTION Left 1986  . detached retina repair Right 1991  . OOPHORECTOMY  1959   right ovary, part of left  . PORTA CATH INSERTION N/A 07/18/2017   Procedure: PORTA CATH INSERTION;  Surgeon: Algernon Huxley, MD;  Location: [facility];  Service: Cardiovascular;  Laterality: N/A;  . RETINAL DETACHMENT SURGERY Left 1997  . uterine hysterectomy  1978    FAMILY HISTORY: family history is not on file.  SOCIAL HISTORY:  reports that she quit smoking about 24 years ago. Her smoking use included cigarettes. She has a 15.00 pack-year smoking history. She has never used smokeless tobacco. She reports that she does not drink alcohol or use drugs.  ALLERGIES: Amoxicillin; Codeine; Neosporin [neomycin-bacitracin zn-polymyx]; and Sulfa antibiotics  MEDICATIONS:  Current Outpatient Medications  Medication Sig Dispense Refill  . ALPRAZolam (XANAX) 0.5 MG tablet Take 0.5 mg by mouth 3 (three) times daily as needed for anxiety.    . lansoprazole (PREVACID) 30 MG capsule Take 30 mg by mouth daily at 12 noon.    Marland Kitchen levothyroxine (SYNTHROID, LEVOTHROID) 50 MCG tablet Take 50 mcg by mouth daily before breakfast.    . lidocaine-prilocaine (EMLA) cream Apply to affected area once 30 g 2  . megestrol (MEGACE) 40 MG tablet Take 1 tablet (40 mg total) by mouth daily. 30 tablet 0  . MICARDIS 40 MG tablet Take 40 mg by mouth daily.    . ondansetron (ZOFRAN) 8 MG tablet Take 1 tablet (8 mg total) by mouth 2 (two) times daily as needed for refractory nausea / vomiting. 30 tablet 2  . PARoxetine (PAXIL) 30 MG tablet Take  30 mg by mouth daily.     . prochlorperazine (COMPAZINE) 10 MG tablet Take 1 tablet (10 mg total) by mouth every 6 (six) hours as needed (Nausea or vomiting). 60 tablet 2  . umeclidinium-vilanterol (ANORO ELLIPTA) 62.5-25 MCG/INH AEPB Inhale 1 puff daily into the lungs. 180 each 3  . amLODipine (NORVASC) 5 MG tablet Take 5 mg by  mouth daily.     Marland Kitchen aspirin 81 MG tablet Take 81 mg by mouth daily.    . Calcium-Vitamin D (CALTRATE 600 PLUS-VIT D PO) Take 1 tablet by mouth daily.     . Multiple Vitamins-Minerals (CENTRUM SILVER ADULT 50+ PO) Take 1 tablet by mouth daily.    . raloxifene (EVISTA) 60 MG tablet Take 60 mg by mouth daily.    . vitamin E 400 UNIT capsule Take 400 Units by mouth daily.     No current facility-administered medications for this encounter.     ECOG PERFORMANCE STATUS:  2 - Symptomatic, <50% confined to bed  REVIEW OF SYSTEMS: except for the fatigue shortness of breath and occasional chest pain  Patient denies any weight loss, fatigue, weakness, fever, chills or night sweats. Patient denies any loss of vision, blurred vision. Patient denies any ringing  of the ears or hearing loss. No irregular heartbeat. Patient denies heart murmur or history of fainting. Patient denies any chest pain or pain radiating to her upper extremities. Patient denies any shortness of breath, difficulty breathing at night, cough or hemoptysis. Patient denies any swelling in the lower legs. Patient denies any nausea vomiting, vomiting of blood, or coffee ground material in the vomitus. Patient denies any stomach pain. Patient states has had normal bowel movements no significant constipation or diarrhea. Patient denies any dysuria, hematuria or significant nocturia. Patient denies any problems walking, swelling in the joints or loss of balance. Patient denies any skin changes, loss of hair or loss of weight. Patient denies any excessive worrying or anxiety or significant depression. Patient denies any problems with insomnia. Patient denies excessive thirst, polyuria, polydipsia. Patient denies any swollen glands, patient denies easy bruising or easy bleeding. Patient denies any recent infections, allergies or URI. Patient "s visual fields have not changed significantly in recent time.   PHYSICAL EXAM: BP 119/60   Pulse 86   Temp  (!) 97.3 F (36.3 C)   Resp 18   Wt 177 lb 0.5 oz (80.3 kg)   BMI 31.36 kg/m  Wheelchair-bound frail-appearing female in NAD. She does have difficulty ambulating and getting on our examination table. She does have decreased breath sounds in her right lower lobe.Well-developed well-nourished patient in NAD. HEENT reveals PERLA, EOMI, discs not visualized.  Oral cavity is clear. No oral mucosal lesions are identified. Neck is clear without evidence of cervical or supraclavicular adenopathy. Lungs are clear to A&P. Cardiac examination is essentially unremarkable with regular rate and rhythm without murmur rub or thrill. Abdomen is benign with no organomegaly or masses noted. Motor sensory and DTR levels are equal and symmetric in the upper and lower extremities. Cranial nerves II through XII are grossly intact. Proprioception is intact. No peripheral adenopathy or edema is identified. No motor or sensory levels are noted. Crude visual fields are within normal range.  LABORATORY DATA: pathology reports reviewed    RADIOLOGY RESULTS:CT scans PET/CT scans and MRI of brain all reviewed and compatible with the above-stated findings   IMPRESSION: this time I like to concentrate on her large right upper lobe mass as well as  mediastinal malignant at adenopathy by PET CT criteria. I can also include a satellite lesion and my treatment fields which is in close proximity to the main mass. I would use a hypofractionated course of IM RT radiation therapy to 6000 cGy in 10 fractions. I would choose I MRT based on the ability to spare critical structures such as a spinal cord heart esophagus and decrease normal lung volume. I will use PET CT fusion as well as4D studyfor treatment planning purposes which I personally ordered and scheduled.There will be extra effort by both professional staff as well as technical staff to coordinate and manage concurrent chemoradiation and ensuing side effects during her  treatments.patient and daughter both seem to comprehend my treatment plan well. Case was personally discussed with medical oncology.     PLAN: as above  I would like to take this opportunity to thank you for allowing me to participate in the care of your patient.Noreene Filbert, MD

## 2017-07-21 ENCOUNTER — Encounter: Payer: Self-pay | Admitting: Internal Medicine

## 2017-07-21 ENCOUNTER — Encounter: Payer: Self-pay | Admitting: *Deleted

## 2017-07-21 ENCOUNTER — Other Ambulatory Visit: Payer: Self-pay

## 2017-07-21 ENCOUNTER — Inpatient Hospital Stay: Payer: Medicare Other

## 2017-07-21 ENCOUNTER — Encounter: Payer: Self-pay | Admitting: Oncology

## 2017-07-21 ENCOUNTER — Inpatient Hospital Stay (HOSPITAL_BASED_OUTPATIENT_CLINIC_OR_DEPARTMENT_OTHER): Payer: Medicare Other | Admitting: Oncology

## 2017-07-21 VITALS — BP 137/72 | HR 67 | Resp 19

## 2017-07-21 VITALS — BP 117/72 | HR 82 | Temp 97.2°F | Resp 18 | Wt 174.0 lb

## 2017-07-21 DIAGNOSIS — R531 Weakness: Secondary | ICD-10-CM

## 2017-07-21 DIAGNOSIS — C3411 Malignant neoplasm of upper lobe, right bronchus or lung: Secondary | ICD-10-CM

## 2017-07-21 DIAGNOSIS — M8580 Other specified disorders of bone density and structure, unspecified site: Secondary | ICD-10-CM

## 2017-07-21 DIAGNOSIS — K8689 Other specified diseases of pancreas: Secondary | ICD-10-CM | POA: Insufficient documentation

## 2017-07-21 DIAGNOSIS — I10 Essential (primary) hypertension: Secondary | ICD-10-CM | POA: Insufficient documentation

## 2017-07-21 DIAGNOSIS — R630 Anorexia: Secondary | ICD-10-CM

## 2017-07-21 DIAGNOSIS — C3491 Malignant neoplasm of unspecified part of right bronchus or lung: Secondary | ICD-10-CM

## 2017-07-21 DIAGNOSIS — R590 Localized enlarged lymph nodes: Secondary | ICD-10-CM

## 2017-07-21 DIAGNOSIS — F39 Unspecified mood [affective] disorder: Secondary | ICD-10-CM | POA: Insufficient documentation

## 2017-07-21 DIAGNOSIS — J90 Pleural effusion, not elsewhere classified: Secondary | ICD-10-CM

## 2017-07-21 DIAGNOSIS — K219 Gastro-esophageal reflux disease without esophagitis: Secondary | ICD-10-CM

## 2017-07-21 DIAGNOSIS — Z5111 Encounter for antineoplastic chemotherapy: Secondary | ICD-10-CM

## 2017-07-21 DIAGNOSIS — K869 Disease of pancreas, unspecified: Secondary | ICD-10-CM

## 2017-07-21 DIAGNOSIS — Z87891 Personal history of nicotine dependence: Secondary | ICD-10-CM

## 2017-07-21 DIAGNOSIS — F418 Other specified anxiety disorders: Secondary | ICD-10-CM

## 2017-07-21 DIAGNOSIS — F419 Anxiety disorder, unspecified: Secondary | ICD-10-CM

## 2017-07-21 DIAGNOSIS — E871 Hypo-osmolality and hyponatremia: Secondary | ICD-10-CM | POA: Diagnosis not present

## 2017-07-21 DIAGNOSIS — E039 Hypothyroidism, unspecified: Secondary | ICD-10-CM | POA: Diagnosis not present

## 2017-07-21 DIAGNOSIS — K5909 Other constipation: Secondary | ICD-10-CM

## 2017-07-21 DIAGNOSIS — J449 Chronic obstructive pulmonary disease, unspecified: Secondary | ICD-10-CM | POA: Insufficient documentation

## 2017-07-21 DIAGNOSIS — R5383 Other fatigue: Secondary | ICD-10-CM

## 2017-07-21 DIAGNOSIS — E860 Dehydration: Secondary | ICD-10-CM

## 2017-07-21 DIAGNOSIS — C3490 Malignant neoplasm of unspecified part of unspecified bronchus or lung: Secondary | ICD-10-CM | POA: Insufficient documentation

## 2017-07-21 DIAGNOSIS — I700 Atherosclerosis of aorta: Secondary | ICD-10-CM

## 2017-07-21 DIAGNOSIS — Z79899 Other long term (current) drug therapy: Secondary | ICD-10-CM

## 2017-07-21 DIAGNOSIS — I872 Venous insufficiency (chronic) (peripheral): Secondary | ICD-10-CM

## 2017-07-21 DIAGNOSIS — Z803 Family history of malignant neoplasm of breast: Secondary | ICD-10-CM

## 2017-07-21 LAB — COMPREHENSIVE METABOLIC PANEL
ALT: 12 U/L (ref 0–44)
AST: 16 U/L (ref 15–41)
Albumin: 2.8 g/dL — ABNORMAL LOW (ref 3.5–5.0)
Alkaline Phosphatase: 52 U/L (ref 38–126)
Anion gap: 9 (ref 5–15)
BUN: 20 mg/dL (ref 8–23)
CO2: 19 mmol/L — AB (ref 22–32)
Calcium: 8.5 mg/dL — ABNORMAL LOW (ref 8.9–10.3)
Chloride: 99 mmol/L (ref 98–111)
Creatinine, Ser: 0.93 mg/dL (ref 0.44–1.00)
GFR calc Af Amer: >60 mL/min (ref >60)
GFR, EST NON AFRICAN AMERICAN: 54 mL/min — AB (ref >60)
Glucose, Bld: 102 mg/dL — ABNORMAL HIGH (ref 70–99)
POTASSIUM: 4.9 mmol/L (ref 3.5–5.1)
SODIUM: 127 mmol/L — AB (ref 135–145)
Total Bilirubin: 0.3 mg/dL (ref 0.3–1.2)
Total Protein: 6.6 g/dL (ref 6.5–8.1)

## 2017-07-21 LAB — CBC WITH DIFFERENTIAL/PLATELET
Basophils Absolute: 0.1 10*3/uL (ref 0–0.1)
Basophils Relative: 1 %
Eosinophils Absolute: 0.2 10*3/uL (ref 0–0.7)
Eosinophils Relative: 2 %
HCT: 31.5 % — ABNORMAL LOW (ref 35.0–47.0)
HEMOGLOBIN: 10.7 g/dL — AB (ref 12.0–16.0)
LYMPHS ABS: 0.7 10*3/uL — AB (ref 1.0–3.6)
LYMPHS PCT: 5 %
MCH: 27.6 pg (ref 26.0–34.0)
MCHC: 34.1 g/dL (ref 32.0–36.0)
MCV: 80.9 fL (ref 80.0–100.0)
MONOS PCT: 8 %
Monocytes Absolute: 1 10*3/uL — ABNORMAL HIGH (ref 0.2–0.9)
NEUTROS PCT: 84 %
Neutro Abs: 10.4 10*3/uL — ABNORMAL HIGH (ref 1.4–6.5)
Platelets: 426 10*3/uL (ref 150–440)
RBC: 3.89 MIL/uL (ref 3.80–5.20)
RDW: 13.1 % (ref 11.5–14.5)
WBC: 12.4 10*3/uL — AB (ref 3.6–11.0)

## 2017-07-21 MED ORDER — FAMOTIDINE IN NACL 20-0.9 MG/50ML-% IV SOLN
20.0000 mg | Freq: Once | INTRAVENOUS | Status: AC
  Administered 2017-07-21: 20 mg via INTRAVENOUS
  Filled 2017-07-21: qty 50

## 2017-07-21 MED ORDER — PALONOSETRON HCL INJECTION 0.25 MG/5ML
0.2500 mg | Freq: Once | INTRAVENOUS | Status: AC
  Administered 2017-07-21: 0.25 mg via INTRAVENOUS
  Filled 2017-07-21: qty 5

## 2017-07-21 MED ORDER — SODIUM CHLORIDE 0.9 % IV SOLN: 10.0000 mg | Freq: Once | INTRAVENOUS | Status: DC

## 2017-07-21 MED ORDER — SODIUM CHLORIDE 0.9% FLUSH
10.0000 mL | INTRAVENOUS | Status: DC | PRN
  Administered 2017-07-21: 10 mL via INTRAVENOUS
  Filled 2017-07-21: qty 10

## 2017-07-21 MED ORDER — DEXAMETHASONE SODIUM PHOSPHATE 10 MG/ML IJ SOLN
10.0000 mg | Freq: Once | INTRAMUSCULAR | Status: AC
  Administered 2017-07-21: 10 mg via INTRAVENOUS
  Filled 2017-07-21: qty 1

## 2017-07-21 MED ORDER — CARBOPLATIN CHEMO INJECTION 450 MG/45ML
160.4000 mg | Freq: Once | INTRAVENOUS | Status: AC
  Administered 2017-07-21: 160 mg via INTRAVENOUS
  Filled 2017-07-21: qty 16

## 2017-07-21 MED ORDER — SODIUM CHLORIDE 0.9 % IV SOLN
Freq: Once | INTRAVENOUS | Status: AC
  Administered 2017-07-21: 10:00:00 via INTRAVENOUS
  Filled 2017-07-21: qty 1000

## 2017-07-21 MED ORDER — SODIUM CHLORIDE 0.9 % IV SOLN: 160.4000 mg | Freq: Once | INTRAVENOUS | Status: DC

## 2017-07-21 MED ORDER — HEPARIN SOD (PORK) LOCK FLUSH 100 UNIT/ML IV SOLN
500.0000 [IU] | Freq: Once | INTRAVENOUS | Status: AC
  Administered 2017-07-21: 500 [IU] via INTRAVENOUS
  Filled 2017-07-21: qty 5

## 2017-07-21 MED ORDER — PACLITAXEL CHEMO INJECTION 300 MG/50ML
45.0000 mg/m2 | Freq: Once | INTRAVENOUS | Status: AC
  Administered 2017-07-21: 90 mg via INTRAVENOUS
  Filled 2017-07-21: qty 15

## 2017-07-21 MED ORDER — DIPHENHYDRAMINE HCL 50 MG/ML IJ SOLN
25.0000 mg | Freq: Once | INTRAMUSCULAR | Status: AC
  Administered 2017-07-21: 25 mg via INTRAVENOUS
  Filled 2017-07-21: qty 1

## 2017-07-21 NOTE — Progress Notes (Signed)
  Oncology Nurse Navigator Documentation  Navigator Location: [facility] (07/21/17 1100)   )Navigator Encounter Type: Treatment (07/21/17 1100)                   Treatment Initiated Date: 07/21/17 (07/21/17 1100) Patient Visit Type: MedOnc (07/21/17 1100) Treatment Phase: First Chemo Tx (07/21/17 1100) Barriers/Navigation Needs: No barriers at this time (07/21/17 1100)   Interventions: None required (07/21/17 1100)                      Time Spent with Patient: 30 (07/21/17 1100)

## 2017-07-21 NOTE — Progress Notes (Signed)
Patient here today for first treatment, denies concerns today.

## 2017-07-22 ENCOUNTER — Ambulatory Visit
Admission: RE | Admit: 2017-07-22 | Discharge: 2017-07-22 | Disposition: A | Discharge status: Home / Self Care | Payer: Medicare Other | Source: Ambulatory Visit | Attending: Radiation Oncology | Admitting: Radiation Oncology

## 2017-07-22 DIAGNOSIS — C3411 Malignant neoplasm of upper lobe, right bronchus or lung: Secondary | ICD-10-CM | POA: Diagnosis not present

## 2017-07-22 DIAGNOSIS — Z87891 Personal history of nicotine dependence: Secondary | ICD-10-CM | POA: Insufficient documentation

## 2017-07-22 DIAGNOSIS — Z510 Encounter for antineoplastic radiation therapy: Secondary | ICD-10-CM | POA: Insufficient documentation

## 2017-07-22 NOTE — Discharge Instructions (Signed)
Thoracentesis, Care After Refer to this sheet in the next few weeks. These instructions provide you with information about caring for yourself after your procedure. Your health care provider may also give you more specific instructions. Your treatment has been planned according to current medical practices, but problems sometimes occur. Call your health care provider if you have any problems or questions after your procedure. What can I expect after the procedure? After your procedure, it is common to have pain at the puncture site. Follow these instructions at home:  Take medicines only as directed by your health care provider.  You may return to your normal diet and normal activities as directed by your health care provider.  Drink enough fluid to keep your urine clear or pale yellow.  Do not take baths, swim, or use a hot tub until your health care provider approves.  Follow your health care provider's instructions about: ? Puncture site care. ? Bandage (dressing) changes and removal.  Check your puncture site every day for signs of infection. Watch for: ? Redness, swelling, or pain. ? Fluid, blood, or pus.  Keep all follow-up visits as directed by your health care provider. This is important. Contact a health care provider if:  You have redness, swelling, or pain at your puncture site.  You have fluid, blood, or pus coming from your puncture site.  You have a fever.  You have chills.  You have nausea or vomiting.  You have trouble breathing.  You develop a worsening cough. Get help right away if:  You have extreme shortness of breath.  You develop chest pain.  You faint or feel light-headed. This information is not intended to replace advice given to you by your health care provider. Make sure you discuss any questions you have with your health care provider. Document Released: 02/01/2014 Document Revised: 09/13/2015 Document Reviewed: 10/23/2013 Elsevier  Interactive Patient Education  [employer].

## 2017-07-25 ENCOUNTER — Ambulatory Visit
Admission: RE | Admit: 2017-07-25 | Discharge: 2017-07-25 | Disposition: A | Discharge status: Home / Self Care | Payer: Medicare Other | Source: Ambulatory Visit | Attending: Oncology | Admitting: Oncology

## 2017-07-25 ENCOUNTER — Ambulatory Visit
Admission: RE | Admit: 2017-07-25 | Discharge: 2017-07-25 | Disposition: A | Discharge status: Home / Self Care | Payer: Medicare Other | Source: Ambulatory Visit | Attending: Interventional Radiology | Admitting: Interventional Radiology

## 2017-07-25 DIAGNOSIS — Z9889 Other specified postprocedural states: Secondary | ICD-10-CM | POA: Diagnosis present

## 2017-07-25 DIAGNOSIS — C3411 Malignant neoplasm of upper lobe, right bronchus or lung: Secondary | ICD-10-CM | POA: Diagnosis not present

## 2017-07-25 DIAGNOSIS — C3491 Malignant neoplasm of unspecified part of right bronchus or lung: Secondary | ICD-10-CM | POA: Insufficient documentation

## 2017-07-25 DIAGNOSIS — Z510 Encounter for antineoplastic radiation therapy: Secondary | ICD-10-CM | POA: Insufficient documentation

## 2017-07-25 NOTE — Procedures (Signed)
Right thora 1 L EBL 0 Comp 0

## 2017-07-26 ENCOUNTER — Other Ambulatory Visit: Payer: Self-pay | Admitting: Oncology

## 2017-07-26 NOTE — Progress Notes (Signed)
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  INTERVAL HISTORY: Patient returns to clinic today for further evaluation and consideration of cycle 2 of weekly carboplatinum and Taxol.  She tolerated her first infusion well without significant side effects.  She continues to have chronic weakness and fatigue which is essentially unchanged.  Her shortness of breath has significantly improved after thoracentesis removed 1 L of pleural fluid. She has no neurologic complaints.  She denies any recent fevers.  Her appetite has improved.  She denies any chest pain, cough, or hemoptysis.  She denies any nausea, vomiting, constipation, or diarrhea.  She has no urinary complaints.  Patient offers no further specific complaints today.  REVIEW OF SYSTEMS:   Review of Systems  Constitutional: Positive for malaise/fatigue. Negative for fever and weight loss.  Respiratory: Negative.  Negative for cough and shortness of breath.   Cardiovascular: Negative.  Negative for chest pain and leg swelling.  Gastrointestinal: Negative.  Negative for abdominal pain and constipation.  Genitourinary: Negative.  Negative for dysuria.  Musculoskeletal: Negative.  Negative for back pain.  Skin: Negative.  Negative for rash.  Neurological: Positive for weakness. Negative for sensory change, focal weakness and headaches.  Psychiatric/Behavioral: Negative.  Negative for depression. The patient is not nervous/anxious.     As per HPI. Otherwise, a complete review of systems is negative.  PAST MEDICAL HISTORY: Past Medical History:  Diagnosis Date  . Cataract   . Chronic constipation   . Chronic venous insufficiency    . COPD (chronic obstructive pulmonary disease) ([place])   . Essential hypertension, benign   . GERD (gastroesophageal reflux disease)   . Hypothyroidism   . Mood disorder ([place])    mostly anxiety  . Non-small cell lung cancer with metastasis ([place])   . Osteopenia   . Pancreatic mass   . Pancreatitis since 1961   chronic    PAST SURGICAL HISTORY: Past Surgical History:  Procedure Laterality Date  . APPENDECTOMY  1949  . CATARACT EXTRACTION Right 1987  . CATARACT EXTRACTION Left 1986  . detached retina repair Right 1991  . OOPHORECTOMY  1959   right ovary, part of left  . PORTA CATH INSERTION N/A 07/18/2017   Procedure: PORTA CATH INSERTION;  Surgeon: Algernon Huxley, MD;  Location: [facility];  Service: Cardiovascular;  Laterality: N/A;  . RETINAL DETACHMENT SURGERY Left 1997  . THORACOTOMY Left 2008   due to pneumonia  . uterine hysterectomy  1978    FAMILY HISTORY: Family History  Problem Relation Age of Onset  . Breast cancer Neg Hx     ADVANCED DIRECTIVES (Y/N):  N  HEALTH MAINTENANCE: Social History   Tobacco Use  . Smoking status: Former Smoker    Packs/day: 0.50    Years: 30.00    Pack years: 15.00    Types: Cigarettes    Last attempt to quit: 01/25/1993    Years since quitting: 24.5  . Smokeless tobacco: Never Used  Substance Use Topics  . Alcohol use: No  . Drug use: No     Colonoscopy:  PAP:  Bone density:  Lipid panel:  Allergies  Allergen Reactions  . Amoxicillin     Has patient had a  PCN reaction causing immediate rash, facial/tongue/throat swelling, SOB or lightheadedness with hypotension: Yes Has patient had a PCN reaction causing severe rash involving mucus membranes or skin necrosis: No Has patient had a PCN reaction that required hospitalization: Unknown Has patient had a PCN reaction occurring within the last 10 years: Unknown If all of the above answers are "NO", then may proceed with Cephalosporin use.   . Codeine     vomiting   . Neosporin [Neomycin-Bacitracin Zn-Polymyx]   . Sulfa Antibiotics Rash    Current Outpatient Medications  Medication Sig Dispense Refill  . ALPRAZolam (XANAX) 0.5 MG tablet Take 0.5 mg by mouth 3 (three) times daily as needed for anxiety.    . lansoprazole (PREVACID) 30 MG capsule Take 30 mg by mouth daily at 12 noon.    Marland Kitchen levothyroxine (SYNTHROID, LEVOTHROID) 50 MCG tablet Take 50 mcg by mouth daily before breakfast.    . lidocaine-prilocaine (EMLA) cream Apply to affected area once 30 g 2  . megestrol (MEGACE) 40 MG tablet Take 1 tablet (40 mg total) by mouth daily. 30 tablet 0  . MICARDIS 40 MG tablet Take 40 mg by mouth daily.    . ondansetron (ZOFRAN) 8 MG tablet Take 1 tablet (8 mg total) by mouth 2 (two) times daily as needed for refractory nausea / vomiting. 30 tablet 2  . PARoxetine (PAXIL) 30 MG tablet Take 30 mg by mouth daily.     Marland Kitchen umeclidinium-vilanterol (ANORO ELLIPTA) 62.5-25 MCG/INH AEPB Inhale 1 puff daily into the lungs. 180 each 3  . prochlorperazine (COMPAZINE) 10 MG tablet Take 1 tablet (10 mg total) by mouth every 6 (six) hours as needed (Nausea or vomiting). (Patient not taking: Reported on 07/27/2017) 60 tablet 2   No current facility-administered medications for this visit.     OBJECTIVE: Vitals:   07/27/17 1132  BP: 101/61  Pulse: 84  Resp: 18  Temp: (!) 97.1 F (36.2 C)     Body mass index is 30.95 kg/m.    ECOG FS:1 - Symptomatic but completely ambulatory  General: Thin, no acute distress, sitting in a wheelchair.   Eyes: Pink conjunctiva, anicteric sclera. HEENT: Normocephalic, moist mucous membranes, clear oropharnyx. Lungs: Clear to auscultation bilaterally. Heart: Regular rate and rhythm. No rubs, murmurs, or gallops. Abdomen: Soft, nontender, nondistended. No organomegaly noted, normoactive bowel sounds. Musculoskeletal: No edema, cyanosis, or clubbing. Neuro: Alert, answering all questions appropriately. Cranial nerves grossly intact. Skin:  No rashes or petechiae noted. Psych: Normal affect.  LAB RESULTS:  Lab Results  Component Value Date   NA 127 (L) 07/27/2017   K 5.1 07/27/2017   CL 99 07/27/2017   CO2 19 (L) 07/27/2017   GLUCOSE 106 (H) 07/27/2017   BUN 20 07/27/2017   CREATININE 1.06 (H) 07/27/2017   CALCIUM 8.4 (L) 07/27/2017   PROT 6.7 07/27/2017   ALBUMIN 2.6 (L) 07/27/2017   AST 17 07/27/2017   ALT 10 07/27/2017   ALKPHOS 50 07/27/2017   BILITOT 0.5 07/27/2017   GFRNONAA 46 (L) 07/27/2017   GFRAA 54 (L) 07/27/2017    Lab Results  Component Value Date   WBC 8.7 07/27/2017   NEUTROABS 6.8 (H) 07/27/2017   HGB 10.5 (L) 07/27/2017   HCT 30.9 (L) 07/27/2017   MCV 81.0 07/27/2017   PLT 420 07/27/2017     STUDIES: Dg Chest 1 View  Result Date: 07/06/2017 CLINICAL DATA:  Status post RIGHT-sided thoracentesis. 1 L of fluid removed. EXAM: CHEST  1 VIEW COMPARISON:  Chest x-ray dated 07/05/2017. FINDINGS: Improved aeration at the RIGHT lung base status post thoracentesis. No pneumothorax seen. Stable appearance of the RIGHT upper lobe mass in the short-term interval. Stable cardiomegaly. Overall cardiomediastinal silhouette is stable. IMPRESSION: No residual pleural effusion appreciated status post RIGHT-sided thoracentesis. No pneumothorax or other procedural complicating features seen. Electronically Signed   By: Franki Cabot M.D.   On: 07/06/2017 09:04   Dg Chest 2 View  Result Date: 07/20/2017 CLINICAL DATA:  Pt having SOB still after having fluid drawn off of her right lung last week. Hx of right lung cancer, pneumonia, port a cath. Former smoker. She starts chemo tomorrow. EXAM: CHEST - 2 VIEW COMPARISON:  CT exam 07/11/2017 and chest x-ray 07/07/2017 FINDINGS: Patient has a RIGHT-sided PowerPort, tip overlying the level of the superior vena cava. No pneumothorax. RIGHT pleural effusion appears to have re-accumulated since thoracentesis, and is similar in volume compared to prior PET-CT. Persistent RIGHT  UPPER lobe mass. History of LEFT thoracotomy. Multiple healed rib fractures are present. IMPRESSION: 1. RIGHT pleural effusion similar to prior PET-CT exam, consistent with reaccumulation following thoracentesis. 2. RIGHT UPPER lobe mass. Electronically Signed   By: Nolon Nations M.D.   On: 07/20/2017 16:20   Dg Chest 2 View  Result Date: 07/05/2017 CLINICAL DATA:  Shortness of breath today. Malaise for 1 month. Former smoker. EXAM: CHEST - 2 VIEW COMPARISON:  Chest radiographs 05/28/2016. Chest CT 04/01/2015. Esophagram 11/19/2016. FINDINGS: The heart size and mediastinal contours are stable. There is aortic atherosclerosis. Right subpulmonic pleural effusion has enlarged compared with the prior radiographs. Peripheral mid right lung density on the frontal examination is new and could reflect extension of fluid into the fissure. This projects over the aortic arch on the lateral view. There is mildly increased right basilar pulmonary opacity. The left lung is clear. Multiple healed rib fractures are present on the right. Left thoracotomy defect and probable remote rib fractures are also noted. IMPRESSION: Compared with 2018 radiographs, there are new pleuroparenchymal opacities within the right hemithorax which may be related to the previously demonstrated right chest wall injury and rib fractures. Other considerations include pneumonia and neoplasm. Further evaluation with chest CT recommended. Electronically Signed   By: Richardean Sale M.D.   On: 07/05/2017 14:41   Ct Chest W Contrast  Result Date: 07/05/2017 CLINICAL DATA:  Shortness of breath and abnormal chest x-ray. Known pancreatic mass. EXAM: CT CHEST WITH CONTRAST TECHNIQUE: Multidetector CT imaging of the chest was performed during intravenous contrast administration. CONTRAST:  7mL OMNIPAQUE IOHEXOL 300 MG/ML  SOLN COMPARISON:  Chest x-ray 07/05/2017 and chest CT 04/01/2015 FINDINGS: Cardiovascular: The heart is normal in size. No pericardial  effusion. The aorta is normal in caliber. Scattered atherosclerotic calcifications. No dissection. The branch vessels are patent. Three-vessel coronary artery calcifications are noted. Mediastinum/Nodes: Mediastinal and hilar adenopathy. Index right hilar node on image number 63 measures 14 mm. 8.5 mm precarinal lymph node on image number 54. 10 mm subcarinal lymph node on image number 67. 9.5 mm left infrahilar lymph node on image number 71. 8 mm epicardial lymph node on image number 98. Lungs/Pleura: Large right upper lobe lung mass measures 4.9 x 3.1 x 3.6 cm. This is most consistent with a primary lung neoplasm. 8.5 mm subpleural nodular density in the left upper lobe on image number 20 is unchanged since 2017 and likely benign. Spiculated 11 mm right upper lobe lesion on image number 69 could be a metastatic focus or a second  primary. 7 mm pulmonary nodule in the right upper lobe adjacent to the major fissure on image number 70 suspicious for metastasis. Moderate to large right pleural effusion, likely malignant. No obvious enhancing pleural nodules. Upper Abdomen: There is a large complex cystic and solid mass projecting off of the upper aspect of the pancreatic body and into the lesser sac. Measures approximately 9.2 x 8.5 cm. It was also present on a prior CT scan from 2015 where it measured 5.5 x 5.1 cm. Scattered small surrounding lymph nodes are noted. Stable low-attenuation lesion in the left hepatic lobe, consistent with benign hepatic cysts. The gallbladder is contracted. Musculoskeletal: No significant bony findings. No obvious metastatic bone disease. IMPRESSION: 1. Large right upper lobe lung mass along with smaller right lung lesions as detailed above. These could be satellite lesions, metastasis or synchronous cancers. Metastasis is also possible given the large pancreatic mass but I think that is much less likely. 2. Mediastinal and hilar lymphadenopathy. 3. Moderate to large right pleural  effusion, likely pathologic. 4. 9.2 x 8.5 cm pancreatic mass, enlarged since prior studies. 5. PET-CT may be helpful for further evaluation and staging purposes. Aortic Atherosclerosis (ICD10-I70.0) and Emphysema (ICD10-J43.9). Electronically Signed   By: Marijo Sanes M.D.   On: 07/05/2017 15:49   Mr Jeri Cos SM Contrast  Result Date: 07/06/2017 CLINICAL DATA:  Lung mass.  Staging for metastatic disease. EXAM: MRI HEAD WITHOUT AND WITH CONTRAST TECHNIQUE: Multiplanar, multiecho pulse sequences of the brain and surrounding structures were obtained without and with intravenous contrast. CONTRAST:  4mL MULTIHANCE GADOBENATE DIMEGLUMINE 529 MG/ML IV SOLN COMPARISON:  CT head 05/28/2016 FINDINGS: Brain: Negative for acute infarct. Negative for metastatic disease. No enhancing mass lesion. Mild atrophy and mild chronic microvascular ischemic change in the white matter. Negative for hemorrhage mass or edema. Vascular: Normal arterial flow void Skull and upper cervical spine: Negative Sinuses/Orbits: Negative Other: None IMPRESSION: Negative for metastatic disease Mild atrophy and mild chronic microvascular ischemic change in the white matter. Electronically Signed   By: Franchot Gallo M.D.   On: 07/06/2017 15:14   Nm Pet Image Initial (pi) Skull Base To Thigh  Result Date: 07/11/2017 CLINICAL DATA:  Initial treatment strategy for non-small cell lung cancer. EXAM: NUCLEAR MEDICINE PET SKULL BASE TO THIGH TECHNIQUE: 9.37 mCi F-18 FDG was injected intravenously. Full-ring PET imaging was performed from the skull base to thigh after the radiotracer. CT data was obtained and used for attenuation correction and anatomic localization. Fasting blood glucose: 84 mg/dl COMPARISON:  Chest CT 07/05/2017 FINDINGS: Mediastinal blood pool activity: SUV max 2.28 NECK: No hypermetabolic lymph nodes in the neck. Incidental CT findings: Carotid artery calcifications. CHEST: The large right upper lobe lung mass is markedly  hypermetabolic with SUV max of 27.07. A more medial right upper lobe pulmonary nodule measures 7 mm on image number 82 and has SUV max of 8.08. 14 mm right middle lobe pulmonary nodule has SUV max of 3.4 and a slightly more posteriorly located right middle lobe lesion has an SUV max of 3.25. Other smaller pulmonary nodules are below the limits of PET CT. Moderate to large right pleural effusion with areas of mild pleural hypermetabolism highly suspicious for pleural metastasis. 16 mm lower right pleural lesion on image number 135 has an SUV max of 18.23. Right hilar and mediastinal lymphadenopathy. 14 mm right hilar lymph node has an SUV max of 4.88. 9.5 mm left hilar node has an SUV max of 5.12. 8.5 mm precarinal lymph  node has an SUV max of 4.0. Incidental CT findings: none ABDOMEN/PELVIS: Large pancreatic mass projecting into the lesser sac is hypermetabolic with SUV max of 6.75. Small left adrenal gland nodule is mildly hypermetabolic with SUV max of 9.16. No evidence of hepatic metastatic disease. No enlarged or hypermetabolic mesenteric or retroperitoneal lymph nodes. Incidental CT findings: none SKELETON: No focal hypermetabolic activity to suggest skeletal metastasis. Incidental CT findings: none IMPRESSION: 1. Large right upper lobe pulmonary mass is markedly hypermetabolic and consistent with known neoplasm. 2. Other pulmonary nodules are also hypermetabolic and likely metastatic disease. 3. Mediastinal and hilar lymphadenopathy. 4. Moderate-sized right pleural effusion with pleural metastatic disease. 5. Large cystic pancreatic mass is hypermetabolic and consistent with neoplasm. Electronically Signed   By: Marijo Sanes M.D.   On: 07/11/2017 14:21   Ct Biopsy  Result Date: 07/07/2017 INDICATION: 84 year old female with right upper lobe pulmonary nodule. She presents for CT-guided lung biopsy. EXAM: CT-guided biopsy right upper lobe pulmonary nodule Interventional Radiologist:  Criselda Peaches, MD  MEDICATIONS: None. ANESTHESIA/SEDATION: Fentanyl 75 mcg IV; Versed 1.5 mg IV Moderate Sedation Time:  26 minutes The patient was continuously monitored during the procedure by the interventional radiology nurse under my direct supervision. FLUOROSCOPY TIME:  Fluoroscopy Time: 0 minutes 0 seconds (0 mGy). COMPLICATIONS: None immediate. Estimated blood loss:  0 PROCEDURE: Informed written consent was obtained from the patient after a thorough discussion of the procedural risks, benefits and alternatives. All questions were addressed. Maximal Sterile Barrier Technique was utilized including caps, mask, sterile gowns, sterile gloves, sterile drape, hand hygiene and skin antiseptic. A timeout was performed prior to the initiation of the procedure. A planning axial CT scan was performed. The nodule in the right upper lobe was successfully identified. A suitable skin entry site was selected and marked. The region was then sterilely prepped and draped in standard fashion using Betadine skin prep. Local anesthesia was attained by infiltration with 1% lidocaine. A small dermatotomy was made. Under intermittent CT fluoroscopic guidance, a 17 gauge trocar needle was advanced into the lung and positioned at the margin of the nodule. Multiple 18 gauge core biopsies were then coaxially obtained using the BioPince automated biopsy device. Biopsy specimens were placed in formalin and delivered to pathology for further analysis. The biopsy device and introducer needle were removed. Post biopsy axial CT imaging demonstrates no evidence of immediate complication. There is no pneumothorax. Mild perilesional alveolar hemorrhage is not unexpected. The patient tolerated the procedure well. IMPRESSION: Technically successful CT-guided biopsy right upper lobe pulmonary nodule. Electronically Signed   By: Jacqulynn Cadet M.D.   On: 07/07/2017 17:27   Dg Chest Port 1 View  Result Date: 07/25/2017 CLINICAL DATA:  Status post thoracentesis.  EXAM: PORTABLE CHEST 1 VIEW COMPARISON:  07/20/2017 FINDINGS: Right pleural effusion has nearly resolved and there is no pneumothorax post right thoracentesis. Heart remains normal in size. Lungs remain hyperaerated. Hazy opacity at the left base is stable. Hazy opacity in the right upper lobe is stable. IMPRESSION: No pneumothorax post thoracentesis. Electronically Signed   By: Marybelle Killings M.D.   On: 07/25/2017 10:59   Dg Chest Port 1 View  Result Date: 07/07/2017 CLINICAL DATA:  Status post right lung biopsy. EXAM: PORTABLE CHEST 1 VIEW COMPARISON:  CT chest 07/05/2017 FINDINGS: Right upper lobe pulmonary mass is again noted. Small right pleural effusion. No right pneumothorax status post biopsy. Left lung is clear. Stable cardiomediastinal silhouette. No acute osseous abnormality. IMPRESSION: No right pneumothorax status  post right lung biopsy. Persistent right upper lobe mass. Persistent stable right pleural effusion. Electronically Signed   By: Kathreen Devoid   On: 07/07/2017 17:31   US Thoracentesis Asp Pleural Space W/img Guide  Result Date: 07/25/2017 INDICATION: Right pleural effusion EXAM: ULTRASOUND GUIDED RIGHT THORACENTESIS MEDICATIONS: None. COMPLICATIONS: None immediate. PROCEDURE: An ultrasound guided thoracentesis was thoroughly discussed with the patient and questions answered. The benefits, risks, alternatives and complications were also discussed. The patient understands and wishes to proceed with the procedure. Written consent was obtained. Ultrasound was performed to localize and mark an adequate pocket of fluid in the right chest. The area was then prepped and draped in the normal sterile fashion. 1% Lidocaine was used for local anesthesia. Under ultrasound guidance a 6 Fr Safe-T-Centesis catheter was introduced. Thoracentesis was performed. The catheter was removed and a dressing applied. FINDINGS: A total of approximately 1 L of dark reddish fluid was removed. IMPRESSION: Successful  ultrasound guided right thoracentesis yielding 1 L of pleural fluid. Electronically Signed   By: Marybelle Killings M.D.   On: 07/25/2017 10:59   US Thoracentesis Asp Pleural Space W/img Guide  Result Date: 07/06/2017 CLINICAL DATA:  Right lung mass, pleural effusion EXAM: ULTRASOUND GUIDED RIGHT THORACENTESIS COMPARISON:  07/05/2017 PROCEDURE: An ultrasound guided thoracentesis was thoroughly discussed with the patient and questions answered. The benefits, risks, alternatives and complications were also discussed. The patient understands and wishes to proceed with the procedure. Written consent was obtained. Ultrasound was performed to localize and mark an adequate pocket of fluid in the right chest. The area was then prepped and draped in the normal sterile fashion. 1% Lidocaine was used for local anesthesia. Under ultrasound guidance a Safe-T-Centesis needle catheter was introduced. Thoracentesis was performed. The catheter was removed and a dressing applied. Complications:  None immediate FINDINGS: A total of approximately 1 L of serosanguineous amber pleural fluid was removed. A fluid sample wassent for laboratory analysis. IMPRESSION: Successful ultrasound guided right thoracentesis yielding 1 L of pleural fluid. Electronically Signed   By: Jerilynn Mages.  Shick M.D.   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  Imaging and pathology results are reviewed extensively.  Patient was also discussed at cancer conference.  She will benefit from concurrent chemotherapy along with XRT.  Because of patient's age and decreased performance status, radiation oncology plans to do 10 fractions of IMRT rather than a 6-week course.  Patient will complete  XRT on August 12, 2017.  Proceed with cycle 2 of weekly carboplatinum and Taxol today. Once she completes her treatment, she will also benefit from maintenance Imfinzi every 2 weeks for up to 12 months.  Return to clinic in 1 week for further evaluation and consideration of cycle 3.   2.  Shortness of breath: Significantly improved after recent thoracentesis which removed 1 L of fluid.  If pleural effusion continues to recur, will consider Pleurx catheter in the future.   3.  Poor appetite: Improved.  Continue Megace as prescribed. 4.  Anxiety: Continue Xanax as prescribed. 5.  Hyponatremia: Patient's sodium levels are decreased, but stable at 127.  Monitor. 6.  Anemia: Mild, monitor.  Patient expressed understanding and was in agreement with this plan. She also  understands that She can call clinic at any time with any questions, concerns, or complaints.   Cancer Staging Squamous cell carcinoma lung, right ([place]) Staging form: Lung, AJCC 8th Edition - Clinical stage from 07/18/2017: Stage IIIA (cT2b, cN2, cM0) - Signed by Lloyd Huger, MD on 07/18/2017   Lloyd Huger, MD   07/29/2017 7:07 AM

## 2017-07-27 ENCOUNTER — Inpatient Hospital Stay: Payer: Medicare Other

## 2017-07-27 ENCOUNTER — Inpatient Hospital Stay (HOSPITAL_BASED_OUTPATIENT_CLINIC_OR_DEPARTMENT_OTHER): Payer: Medicare Other | Admitting: Oncology

## 2017-07-27 ENCOUNTER — Encounter: Payer: Self-pay | Admitting: Oncology

## 2017-07-27 ENCOUNTER — Other Ambulatory Visit: Payer: Self-pay

## 2017-07-27 ENCOUNTER — Inpatient Hospital Stay: Payer: Medicare Other | Attending: Oncology

## 2017-07-27 VITALS — BP 101/61 | HR 84 | Temp 97.1°F | Resp 18 | Wt 174.7 lb

## 2017-07-27 DIAGNOSIS — R197 Diarrhea, unspecified: Secondary | ICD-10-CM | POA: Diagnosis not present

## 2017-07-27 DIAGNOSIS — Z87891 Personal history of nicotine dependence: Secondary | ICD-10-CM

## 2017-07-27 DIAGNOSIS — R531 Weakness: Secondary | ICD-10-CM | POA: Insufficient documentation

## 2017-07-27 DIAGNOSIS — Z79899 Other long term (current) drug therapy: Secondary | ICD-10-CM

## 2017-07-27 DIAGNOSIS — J449 Chronic obstructive pulmonary disease, unspecified: Secondary | ICD-10-CM

## 2017-07-27 DIAGNOSIS — I10 Essential (primary) hypertension: Secondary | ICD-10-CM

## 2017-07-27 DIAGNOSIS — I872 Venous insufficiency (chronic) (peripheral): Secondary | ICD-10-CM

## 2017-07-27 DIAGNOSIS — C3491 Malignant neoplasm of unspecified part of right bronchus or lung: Secondary | ICD-10-CM

## 2017-07-27 DIAGNOSIS — R0602 Shortness of breath: Secondary | ICD-10-CM | POA: Diagnosis not present

## 2017-07-27 DIAGNOSIS — E039 Hypothyroidism, unspecified: Secondary | ICD-10-CM | POA: Insufficient documentation

## 2017-07-27 DIAGNOSIS — C3411 Malignant neoplasm of upper lobe, right bronchus or lung: Secondary | ICD-10-CM | POA: Insufficient documentation

## 2017-07-27 DIAGNOSIS — Z5111 Encounter for antineoplastic chemotherapy: Secondary | ICD-10-CM | POA: Insufficient documentation

## 2017-07-27 DIAGNOSIS — M8580 Other specified disorders of bone density and structure, unspecified site: Secondary | ICD-10-CM

## 2017-07-27 DIAGNOSIS — R590 Localized enlarged lymph nodes: Secondary | ICD-10-CM

## 2017-07-27 DIAGNOSIS — F419 Anxiety disorder, unspecified: Secondary | ICD-10-CM | POA: Insufficient documentation

## 2017-07-27 DIAGNOSIS — K5900 Constipation, unspecified: Secondary | ICD-10-CM

## 2017-07-27 DIAGNOSIS — D649 Anemia, unspecified: Secondary | ICD-10-CM

## 2017-07-27 DIAGNOSIS — K219 Gastro-esophageal reflux disease without esophagitis: Secondary | ICD-10-CM

## 2017-07-27 DIAGNOSIS — C3490 Malignant neoplasm of unspecified part of unspecified bronchus or lung: Secondary | ICD-10-CM

## 2017-07-27 DIAGNOSIS — E871 Hypo-osmolality and hyponatremia: Secondary | ICD-10-CM

## 2017-07-27 DIAGNOSIS — R5383 Other fatigue: Secondary | ICD-10-CM | POA: Insufficient documentation

## 2017-07-27 DIAGNOSIS — D61818 Other pancytopenia: Secondary | ICD-10-CM | POA: Insufficient documentation

## 2017-07-27 DIAGNOSIS — R1900 Intra-abdominal and pelvic swelling, mass and lump, unspecified site: Secondary | ICD-10-CM | POA: Insufficient documentation

## 2017-07-27 LAB — COMPREHENSIVE METABOLIC PANEL
ALK PHOS: 50 U/L (ref 38–126)
ALT: 10 U/L (ref 0–44)
ANION GAP: 9 (ref 5–15)
AST: 17 U/L (ref 15–41)
Albumin: 2.6 g/dL — ABNORMAL LOW (ref 3.5–5.0)
BUN: 20 mg/dL (ref 8–23)
CALCIUM: 8.4 mg/dL — AB (ref 8.9–10.3)
CHLORIDE: 99 mmol/L (ref 98–111)
CO2: 19 mmol/L — AB (ref 22–32)
Creatinine, Ser: 1.06 mg/dL — ABNORMAL HIGH (ref 0.44–1.00)
GFR calc non Af Amer: 46 mL/min — ABNORMAL LOW (ref >60)
GFR, EST AFRICAN AMERICAN: 54 mL/min — AB (ref >60)
Glucose, Bld: 106 mg/dL — ABNORMAL HIGH (ref 70–99)
Potassium: 5.1 mmol/L (ref 3.5–5.1)
SODIUM: 127 mmol/L — AB (ref 135–145)
Total Bilirubin: 0.5 mg/dL (ref 0.3–1.2)
Total Protein: 6.7 g/dL (ref 6.5–8.1)

## 2017-07-27 LAB — CBC WITH DIFFERENTIAL/PLATELET
BASOS PCT: 1 %
Basophils Absolute: 0.1 10*3/uL (ref 0–0.1)
Eosinophils Absolute: 0.3 10*3/uL (ref 0–0.7)
Eosinophils Relative: 4 %
HEMATOCRIT: 30.9 % — AB (ref 35.0–47.0)
HEMOGLOBIN: 10.5 g/dL — AB (ref 12.0–16.0)
LYMPHS ABS: 0.6 10*3/uL — AB (ref 1.0–3.6)
LYMPHS PCT: 7 %
MCH: 27.5 pg (ref 26.0–34.0)
MCHC: 33.9 g/dL (ref 32.0–36.0)
MCV: 81 fL (ref 80.0–100.0)
MONO ABS: 0.8 10*3/uL (ref 0.2–0.9)
MONOS PCT: 10 %
NEUTROS ABS: 6.8 10*3/uL — AB (ref 1.4–6.5)
NEUTROS PCT: 78 %
Platelets: 420 10*3/uL (ref 150–440)
RBC: 3.82 MIL/uL (ref 3.80–5.20)
RDW: 13.1 % (ref 11.5–14.5)
WBC: 8.7 10*3/uL (ref 3.6–11.0)

## 2017-07-27 MED ORDER — DIPHENHYDRAMINE HCL 50 MG/ML IJ SOLN
25.0000 mg | Freq: Once | INTRAMUSCULAR | Status: AC
  Administered 2017-07-27: 25 mg via INTRAVENOUS
  Filled 2017-07-27: qty 1

## 2017-07-27 MED ORDER — SODIUM CHLORIDE 0.9 % IV SOLN: 10.0000 mg | Freq: Once | INTRAVENOUS | Status: DC

## 2017-07-27 MED ORDER — SODIUM CHLORIDE 0.9 % IV SOLN
Freq: Once | INTRAVENOUS | Status: AC
  Administered 2017-07-27: 13:00:00 via INTRAVENOUS
  Filled 2017-07-27: qty 1000

## 2017-07-27 MED ORDER — PALONOSETRON HCL INJECTION 0.25 MG/5ML
0.2500 mg | Freq: Once | INTRAVENOUS | Status: AC
  Administered 2017-07-27: 0.25 mg via INTRAVENOUS
  Filled 2017-07-27: qty 5

## 2017-07-27 MED ORDER — CARBOPLATIN CHEMO INJECTION 450 MG/45ML
154.2000 mg | Freq: Once | INTRAVENOUS | Status: AC
  Administered 2017-07-27: 150 mg via INTRAVENOUS
  Filled 2017-07-27: qty 15

## 2017-07-27 MED ORDER — DEXAMETHASONE SODIUM PHOSPHATE 10 MG/ML IJ SOLN
10.0000 mg | Freq: Once | INTRAMUSCULAR | Status: AC
  Administered 2017-07-27: 10 mg via INTRAVENOUS
  Filled 2017-07-27: qty 1

## 2017-07-27 MED ORDER — SODIUM CHLORIDE 0.9 % IV SOLN
45.0000 mg/m2 | Freq: Once | INTRAVENOUS | Status: AC
  Administered 2017-07-27: 90 mg via INTRAVENOUS
  Filled 2017-07-27: qty 15

## 2017-07-27 MED ORDER — FAMOTIDINE IN NACL 20-0.9 MG/50ML-% IV SOLN
20.0000 mg | Freq: Once | INTRAVENOUS | Status: AC
  Administered 2017-07-27: 20 mg via INTRAVENOUS
  Filled 2017-07-27: qty 50

## 2017-07-27 MED ORDER — HEPARIN SOD (PORK) LOCK FLUSH 100 UNIT/ML IV SOLN
500.0000 [IU] | Freq: Once | INTRAVENOUS | Status: DC | PRN
  Filled 2017-07-27: qty 5

## 2017-07-27 MED ORDER — HEPARIN SOD (PORK) LOCK FLUSH 100 UNIT/ML IV SOLN
500.0000 [IU] | Freq: Once | INTRAVENOUS | Status: AC
  Administered 2017-07-27: 500 [IU] via INTRAVENOUS

## 2017-07-27 MED ORDER — SODIUM CHLORIDE 0.9% FLUSH
10.0000 mL | Freq: Once | INTRAVENOUS | Status: AC
  Administered 2017-07-27: 10 mL via INTRAVENOUS
  Filled 2017-07-27: qty 10

## 2017-07-27 NOTE — Progress Notes (Signed)
Here for follow up living at [facility] currently.stated she overall " feels well"

## 2017-07-29 ENCOUNTER — Encounter: Payer: Self-pay | Admitting: Oncology

## 2017-07-29 ENCOUNTER — Ambulatory Visit: Payer: Medicare Other | Admitting: Internal Medicine

## 2017-07-31 NOTE — Progress Notes (Addendum)
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  INTERVAL HISTORY: Patient returns to clinic today for further evaluation and consideration of cycle 3 of weekly carboplatinum and Taxol.  Her weakness and fatigue are improving.  She does not have any recurrence of her shortness of breath after her thoracentesis several weeks ago.  She currently feels well. She has no neurologic complaints.  She denies any recent fevers.  Her appetite has improved.  She denies any chest pain, cough, or hemoptysis.  She denies any nausea, vomiting, constipation, or diarrhea. She has no urinary complaints.  Patient offers no specific complaints today.  REVIEW OF SYSTEMS:   Review of Systems  Constitutional: Positive for malaise/fatigue. Negative for fever and weight loss.  Respiratory: Negative.  Negative for cough and shortness of breath.   Cardiovascular: Negative.  Negative for chest pain and leg swelling.  Gastrointestinal: Negative.  Negative for abdominal pain and constipation.  Genitourinary: Negative.  Negative for dysuria.  Musculoskeletal: Negative.  Negative for back pain.  Skin: Negative.  Negative for rash.  Neurological: Positive for weakness. Negative for sensory change, focal weakness and headaches.  Psychiatric/Behavioral: Negative.  Negative for depression. The patient is not nervous/anxious.     As per HPI. Otherwise, a complete review of systems is negative.  PAST MEDICAL HISTORY: Past Medical History:  Diagnosis Date  . Cataract   . Chronic constipation   . Chronic venous insufficiency   . COPD (chronic obstructive pulmonary disease) ([place])   . Essential hypertension, benign   .  GERD (gastroesophageal reflux disease)   . Hypothyroidism   . Mood disorder ([place])    mostly anxiety  . Non-small cell lung cancer with metastasis ([place])   . Osteopenia   . Pancreatic mass   . Pancreatitis since 1961   chronic    PAST SURGICAL HISTORY: Past Surgical History:  Procedure Laterality Date  . APPENDECTOMY  1949  . CATARACT EXTRACTION Right 1987  . CATARACT EXTRACTION Left 1986  . detached retina repair Right 1991  . OOPHORECTOMY  1959   right ovary, part of left  . PORTA CATH INSERTION N/A 07/18/2017   Procedure: PORTA CATH INSERTION;  Surgeon: Algernon Huxley, MD;  Location: [facility];  Service: Cardiovascular;  Laterality: N/A;  . RETINAL DETACHMENT SURGERY Left 1997  . THORACOTOMY Left 2008   due to pneumonia  . uterine hysterectomy  1978    FAMILY HISTORY: Family History  Problem Relation Age of Onset  . Breast cancer Neg Hx     ADVANCED DIRECTIVES (Y/N):  N  HEALTH MAINTENANCE: Social History   Tobacco Use  . Smoking status: Former Smoker    Packs/day: 0.50    Years: 30.00    Pack years: 15.00    Types: Cigarettes    Last attempt to quit: 01/25/1993    Years since quitting: 24.5  . Smokeless tobacco: Never Used  Substance Use Topics  . Alcohol use: No  . Drug use: No     Colonoscopy:  PAP:  Bone density:  Lipid panel:  Allergies  Allergen Reactions  . Amoxicillin     Has patient had a PCN reaction causing immediate rash, facial/tongue/throat swelling, SOB or lightheadedness with hypotension:  Yes Has patient had a PCN reaction causing severe rash involving mucus membranes or skin necrosis: No Has patient had a PCN reaction that required hospitalization: Unknown Has patient had a PCN reaction occurring within the last 10 years: Unknown If all of the above answers are "NO", then may proceed with Cephalosporin use.   . Codeine     vomiting  . Neosporin [Neomycin-Bacitracin Zn-Polymyx]   . Sulfa Antibiotics Rash    Current  Outpatient Medications  Medication Sig Dispense Refill  . ALPRAZolam (XANAX) 0.5 MG tablet Take 0.5 mg by mouth 3 (three) times daily as needed for anxiety.    . lansoprazole (PREVACID) 30 MG capsule Take 30 mg by mouth daily at 12 noon.    Marland Kitchen levothyroxine (SYNTHROID, LEVOTHROID) 50 MCG tablet Take 50 mcg by mouth daily before breakfast.    . lidocaine-prilocaine (EMLA) cream Apply to affected area once 30 g 2  . megestrol (MEGACE) 40 MG tablet Take 1 tablet (40 mg total) by mouth daily. 30 tablet 0  . MICARDIS 40 MG tablet Take 40 mg by mouth daily.    Marland Kitchen PARoxetine (PAXIL) 30 MG tablet Take 30 mg by mouth daily.     Marland Kitchen umeclidinium-vilanterol (ANORO ELLIPTA) 62.5-25 MCG/INH AEPB Inhale 1 puff daily into the lungs. 180 each 3  . ondansetron (ZOFRAN) 8 MG tablet Take 1 tablet (8 mg total) by mouth 2 (two) times daily as needed for refractory nausea / vomiting. (Patient not taking: Reported on 08/04/2017) 30 tablet 2  . prochlorperazine (COMPAZINE) 10 MG tablet Take 1 tablet (10 mg total) by mouth every 6 (six) hours as needed (Nausea or vomiting). (Patient not taking: Reported on 07/27/2017) 60 tablet 2   No current facility-administered medications for this visit.     OBJECTIVE: Vitals:   08/04/17 1046  BP: 121/67  Pulse: 73  Resp: 18  Temp: (!) 96.1 F (35.6 C)     Body mass index is 31.23 kg/m.    ECOG FS:1 - Symptomatic but completely ambulatory   General: No acute distress.  Sitting in a wheelchair. Eyes: Pink conjunctiva, anicteric sclera. HEENT: Normocephalic, moist mucous membranes, clear oropharnyx. Lungs: Clear to auscultation bilaterally. Heart: Regular rate and rhythm. No rubs, murmurs, or gallops. Abdomen: Soft, nontender, nondistended. No organomegaly noted, normoactive bowel sounds. Musculoskeletal: No edema, cyanosis, or clubbing. Neuro: Alert, answering all questions appropriately. Cranial nerves grossly intact. Skin: No rashes or petechiae noted. Psych: Normal  affect.  LAB RESULTS:  Lab Results  Component Value Date   NA 127 (L) 08/04/2017   K 4.3 08/04/2017   CL 102 08/04/2017   CO2 18 (L) 08/04/2017   GLUCOSE 114 (H) 08/04/2017   BUN 16 08/04/2017   CREATININE 0.81 08/04/2017   CALCIUM 8.3 (L) 08/04/2017   PROT 6.4 (L) 08/04/2017   ALBUMIN 2.8 (L) 08/04/2017   AST 17 08/04/2017   ALT 12 08/04/2017   ALKPHOS 55 08/04/2017   BILITOT 0.4 08/04/2017   GFRNONAA >60 08/04/2017   GFRAA >60 08/04/2017    Lab Results  Component Value Date   WBC 5.1 08/04/2017   NEUTROABS 3.9 08/04/2017   HGB 9.8 (L) 08/04/2017   HCT 28.4 (L) 08/04/2017   MCV 80.4 08/04/2017   PLT 414 08/04/2017     STUDIES: Dg Chest 2 View  Result Date: 07/20/2017 CLINICAL DATA:  Pt having SOB still after having fluid drawn off of her right lung last week. Hx of right lung cancer, pneumonia, port a cath. Former smoker.  She starts chemo tomorrow. EXAM: CHEST - 2 VIEW COMPARISON:  CT exam 07/11/2017 and chest x-ray 07/07/2017 FINDINGS: Patient has a RIGHT-sided PowerPort, tip overlying the level of the superior vena cava. No pneumothorax. RIGHT pleural effusion appears to have re-accumulated since thoracentesis, and is similar in volume compared to prior PET-CT. Persistent RIGHT UPPER lobe mass. History of LEFT thoracotomy. Multiple healed rib fractures are present. IMPRESSION: 1. RIGHT pleural effusion similar to prior PET-CT exam, consistent with reaccumulation following thoracentesis. 2. RIGHT UPPER lobe mass. Electronically Signed   By: Nolon Nations M.D.   On: 07/20/2017 16:20   Nm Pet Image Initial (pi) Skull Base To Thigh  Result Date: 07/11/2017 CLINICAL DATA:  Initial treatment strategy for non-small cell lung cancer. EXAM: NUCLEAR MEDICINE PET SKULL BASE TO THIGH TECHNIQUE: 9.37 mCi F-18 FDG was injected intravenously. Full-ring PET imaging was performed from the skull base to thigh after the radiotracer. CT data was obtained and used for attenuation  correction and anatomic localization. Fasting blood glucose: 84 mg/dl COMPARISON:  Chest CT 07/05/2017 FINDINGS: Mediastinal blood pool activity: SUV max 2.28 NECK: No hypermetabolic lymph nodes in the neck. Incidental CT findings: Carotid artery calcifications. CHEST: The large right upper lobe lung mass is markedly hypermetabolic with SUV max of 51.70. A more medial right upper lobe pulmonary nodule measures 7 mm on image number 82 and has SUV max of 8.08. 14 mm right middle lobe pulmonary nodule has SUV max of 3.4 and a slightly more posteriorly located right middle lobe lesion has an SUV max of 3.25. Other smaller pulmonary nodules are below the limits of PET CT. Moderate to large right pleural effusion with areas of mild pleural hypermetabolism highly suspicious for pleural metastasis. 16 mm lower right pleural lesion on image number 135 has an SUV max of 18.23. Right hilar and mediastinal lymphadenopathy. 14 mm right hilar lymph node has an SUV max of 4.88. 9.5 mm left hilar node has an SUV max of 5.12. 8.5 mm precarinal lymph node has an SUV max of 4.0. Incidental CT findings: none ABDOMEN/PELVIS: Large pancreatic mass projecting into the lesser sac is hypermetabolic with SUV max of 0.17. Small left adrenal gland nodule is mildly hypermetabolic with SUV max of 4.94. No evidence of hepatic metastatic disease. No enlarged or hypermetabolic mesenteric or retroperitoneal lymph nodes. Incidental CT findings: none SKELETON: No focal hypermetabolic activity to suggest skeletal metastasis. Incidental CT findings: none IMPRESSION: 1. Large right upper lobe pulmonary mass is markedly hypermetabolic and consistent with known neoplasm. 2. Other pulmonary nodules are also hypermetabolic and likely metastatic disease. 3. Mediastinal and hilar lymphadenopathy. 4. Moderate-sized right pleural effusion with pleural metastatic disease. 5. Large cystic pancreatic mass is hypermetabolic and consistent with neoplasm.  Electronically Signed   By: Marijo Sanes M.D.   On: 07/11/2017 14:21   Dg Chest Port 1 View  Result Date: 07/25/2017 CLINICAL DATA:  Status post thoracentesis. EXAM: PORTABLE CHEST 1 VIEW COMPARISON:  07/20/2017 FINDINGS: Right pleural effusion has nearly resolved and there is no pneumothorax post right thoracentesis. Heart remains normal in size. Lungs remain hyperaerated. Hazy opacity at the left base is stable. Hazy opacity in the right upper lobe is stable. IMPRESSION: No pneumothorax post thoracentesis. Electronically Signed   By: Marybelle Killings M.D.   On: 07/25/2017 10:59   US Thoracentesis Asp Pleural Space W/img Guide  Result Date: 07/25/2017 INDICATION: Right pleural effusion EXAM: ULTRASOUND GUIDED RIGHT THORACENTESIS MEDICATIONS: None. COMPLICATIONS: None immediate. PROCEDURE: An ultrasound guided thoracentesis was  thoroughly discussed with the patient and questions answered. The benefits, risks, alternatives and complications were also discussed. The patient understands and wishes to proceed with the procedure. Written consent was obtained. Ultrasound was performed to localize and mark an adequate pocket of fluid in the right chest. The area was then prepped and draped in the normal sterile fashion. 1% Lidocaine was used for local anesthesia. Under ultrasound guidance a 6 Fr Safe-T-Centesis catheter was introduced. Thoracentesis was performed. The catheter was removed and a dressing applied. FINDINGS: A total of approximately 1 L of dark reddish fluid was removed. IMPRESSION: Successful ultrasound guided right thoracentesis yielding 1 L of pleural fluid. Electronically Signed   By: Marybelle Killings M.D.   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  PLAN:    1.  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  Imaging and pathology results are reviewed extensively.  Patient was also discussed at cancer conference.  She will benefit from concurrent chemotherapy along with XRT.  Because of patient's age and decreased performance status, radiation oncology plans to do 10 fractions of IMRT rather than a 6-week course.  Patient will complete XRT on August 12, 2017.  Proceed with cycle 3 of weekly carboplatinum and Taxol today.  Once she completes her treatment, she will also benefit from maintenance Imfinzi every 2 weeks for up to 12 months.  Return to clinic in 1 week for further evaluation and consideration of cycle 4. 2.  Shortness of breath: Patient does not complain of this today.  Significantly improved after recent thoracentesis which removed 1 L of fluid.  If pleural effusion continues to recur, will consider Pleurx catheter in the future.   3.  Poor appetite: Improved.  Continue Megace as prescribed. 4.  Anxiety: Continue Xanax as prescribed. 5.  Hyponatremia: Sodium levels are stable 127, monitor. 6.  Anemia: Hemoglobin has trended down slightly to 9.8.  Continue to monitor.  She does not require transfusion at this time.  Patient expressed understanding and was in agreement with this plan. She also understands that She can call clinic at any time with any questions, concerns, or complaints.   Cancer Staging Squamous cell carcinoma lung, right ([place]) Staging form: Lung, AJCC 8th Edition - Clinical stage from 07/18/2017: Stage IIIA (cT2b, cN2, cM0) - Signed by Lloyd Huger, MD on 07/18/2017   Lloyd Huger, MD   08/08/2017 11:04 AM

## 2017-08-01 ENCOUNTER — Ambulatory Visit
Admission: RE | Admit: 2017-08-01 | Discharge: 2017-08-01 | Disposition: A | Discharge status: Home / Self Care | Payer: Medicare Other | Source: Ambulatory Visit | Attending: Radiation Oncology | Admitting: Radiation Oncology

## 2017-08-02 ENCOUNTER — Ambulatory Visit
Admission: RE | Admit: 2017-08-02 | Discharge: 2017-08-02 | Disposition: A | Discharge status: Home / Self Care | Payer: Medicare Other | Source: Ambulatory Visit | Attending: Radiation Oncology | Admitting: Radiation Oncology

## 2017-08-02 DIAGNOSIS — Z510 Encounter for antineoplastic radiation therapy: Secondary | ICD-10-CM | POA: Diagnosis not present

## 2017-08-03 ENCOUNTER — Ambulatory Visit
Admission: RE | Admit: 2017-08-03 | Discharge: 2017-08-03 | Disposition: A | Discharge status: Home / Self Care | Payer: Medicare Other | Source: Ambulatory Visit | Attending: Radiation Oncology | Admitting: Radiation Oncology

## 2017-08-03 DIAGNOSIS — Z510 Encounter for antineoplastic radiation therapy: Secondary | ICD-10-CM | POA: Diagnosis not present

## 2017-08-04 ENCOUNTER — Inpatient Hospital Stay: Payer: Medicare Other

## 2017-08-04 ENCOUNTER — Inpatient Hospital Stay (HOSPITAL_BASED_OUTPATIENT_CLINIC_OR_DEPARTMENT_OTHER): Payer: Medicare Other | Admitting: Oncology

## 2017-08-04 ENCOUNTER — Ambulatory Visit
Admission: RE | Admit: 2017-08-04 | Discharge: 2017-08-04 | Disposition: A | Discharge status: Home / Self Care | Payer: Medicare Other | Source: Ambulatory Visit | Attending: Radiation Oncology | Admitting: Radiation Oncology

## 2017-08-04 ENCOUNTER — Other Ambulatory Visit: Payer: Self-pay

## 2017-08-04 VITALS — BP 121/67 | HR 73 | Temp 96.1°F | Resp 18 | Wt 176.3 lb

## 2017-08-04 DIAGNOSIS — C3490 Malignant neoplasm of unspecified part of unspecified bronchus or lung: Secondary | ICD-10-CM

## 2017-08-04 DIAGNOSIS — K219 Gastro-esophageal reflux disease without esophagitis: Secondary | ICD-10-CM

## 2017-08-04 DIAGNOSIS — D649 Anemia, unspecified: Secondary | ICD-10-CM

## 2017-08-04 DIAGNOSIS — E039 Hypothyroidism, unspecified: Secondary | ICD-10-CM

## 2017-08-04 DIAGNOSIS — Z5111 Encounter for antineoplastic chemotherapy: Secondary | ICD-10-CM

## 2017-08-04 DIAGNOSIS — J439 Emphysema, unspecified: Secondary | ICD-10-CM | POA: Diagnosis not present

## 2017-08-04 DIAGNOSIS — C3491 Malignant neoplasm of unspecified part of right bronchus or lung: Secondary | ICD-10-CM

## 2017-08-04 DIAGNOSIS — F39 Unspecified mood [affective] disorder: Secondary | ICD-10-CM

## 2017-08-04 DIAGNOSIS — K5900 Constipation, unspecified: Secondary | ICD-10-CM

## 2017-08-04 DIAGNOSIS — R590 Localized enlarged lymph nodes: Secondary | ICD-10-CM

## 2017-08-04 DIAGNOSIS — R531 Weakness: Secondary | ICD-10-CM

## 2017-08-04 DIAGNOSIS — M8580 Other specified disorders of bone density and structure, unspecified site: Secondary | ICD-10-CM

## 2017-08-04 DIAGNOSIS — I872 Venous insufficiency (chronic) (peripheral): Secondary | ICD-10-CM

## 2017-08-04 DIAGNOSIS — Z79899 Other long term (current) drug therapy: Secondary | ICD-10-CM

## 2017-08-04 DIAGNOSIS — R1900 Intra-abdominal and pelvic swelling, mass and lump, unspecified site: Secondary | ICD-10-CM

## 2017-08-04 DIAGNOSIS — E871 Hypo-osmolality and hyponatremia: Secondary | ICD-10-CM

## 2017-08-04 DIAGNOSIS — I10 Essential (primary) hypertension: Secondary | ICD-10-CM

## 2017-08-04 DIAGNOSIS — C3411 Malignant neoplasm of upper lobe, right bronchus or lung: Secondary | ICD-10-CM

## 2017-08-04 DIAGNOSIS — F419 Anxiety disorder, unspecified: Secondary | ICD-10-CM

## 2017-08-04 DIAGNOSIS — Z510 Encounter for antineoplastic radiation therapy: Secondary | ICD-10-CM | POA: Diagnosis not present

## 2017-08-04 DIAGNOSIS — R5383 Other fatigue: Secondary | ICD-10-CM

## 2017-08-04 DIAGNOSIS — J449 Chronic obstructive pulmonary disease, unspecified: Secondary | ICD-10-CM

## 2017-08-04 DIAGNOSIS — Z87891 Personal history of nicotine dependence: Secondary | ICD-10-CM

## 2017-08-04 DIAGNOSIS — R0602 Shortness of breath: Secondary | ICD-10-CM

## 2017-08-04 LAB — CBC WITH DIFFERENTIAL/PLATELET
Basophils Absolute: 0 10*3/uL (ref 0–0.1)
Basophils Relative: 1 %
EOS ABS: 0.1 10*3/uL (ref 0–0.7)
Eosinophils Relative: 2 %
HEMATOCRIT: 28.4 % — AB (ref 35.0–47.0)
Hemoglobin: 9.8 g/dL — ABNORMAL LOW (ref 12.0–16.0)
Lymphocytes Relative: 8 %
Lymphs Abs: 0.4 10*3/uL — ABNORMAL LOW (ref 1.0–3.6)
MCH: 27.7 pg (ref 26.0–34.0)
MCHC: 34.5 g/dL (ref 32.0–36.0)
MCV: 80.4 fL (ref 80.0–100.0)
MONO ABS: 0.6 10*3/uL (ref 0.2–0.9)
MONOS PCT: 12 %
NEUTROS ABS: 3.9 10*3/uL (ref 1.4–6.5)
Neutrophils Relative %: 77 %
Platelets: 414 10*3/uL (ref 150–440)
RBC: 3.53 MIL/uL — ABNORMAL LOW (ref 3.80–5.20)
RDW: 13.3 % (ref 11.5–14.5)
WBC: 5.1 10*3/uL (ref 3.6–11.0)

## 2017-08-04 LAB — COMPREHENSIVE METABOLIC PANEL
ALT: 12 U/L (ref 0–44)
AST: 17 U/L (ref 15–41)
Albumin: 2.8 g/dL — ABNORMAL LOW (ref 3.5–5.0)
Alkaline Phosphatase: 55 U/L (ref 38–126)
Anion gap: 7 (ref 5–15)
BUN: 16 mg/dL (ref 8–23)
CHLORIDE: 102 mmol/L (ref 98–111)
CO2: 18 mmol/L — AB (ref 22–32)
CREATININE: 0.81 mg/dL (ref 0.44–1.00)
Calcium: 8.3 mg/dL — ABNORMAL LOW (ref 8.9–10.3)
Glucose, Bld: 114 mg/dL — ABNORMAL HIGH (ref 70–99)
POTASSIUM: 4.3 mmol/L (ref 3.5–5.1)
SODIUM: 127 mmol/L — AB (ref 135–145)
Total Bilirubin: 0.4 mg/dL (ref 0.3–1.2)
Total Protein: 6.4 g/dL — ABNORMAL LOW (ref 6.5–8.1)

## 2017-08-04 MED ORDER — HEPARIN SOD (PORK) LOCK FLUSH 100 UNIT/ML IV SOLN: 500.0000 [IU] | Freq: Once | INTRAVENOUS | Status: DC | PRN

## 2017-08-04 MED ORDER — SODIUM CHLORIDE 0.9 % IV SOLN: 10.0000 mg | Freq: Once | INTRAVENOUS | Status: DC

## 2017-08-04 MED ORDER — SODIUM CHLORIDE 0.9 % IV SOLN
45.0000 mg/m2 | Freq: Once | INTRAVENOUS | Status: AC
  Administered 2017-08-04: 90 mg via INTRAVENOUS
  Filled 2017-08-04: qty 15

## 2017-08-04 MED ORDER — SODIUM CHLORIDE 0.9% FLUSH
10.0000 mL | INTRAVENOUS | Status: DC | PRN
  Administered 2017-08-04: 10 mL via INTRAVENOUS
  Filled 2017-08-04: qty 10

## 2017-08-04 MED ORDER — DEXAMETHASONE SODIUM PHOSPHATE 10 MG/ML IJ SOLN
10.0000 mg | Freq: Once | INTRAMUSCULAR | Status: AC
  Administered 2017-08-04: 10 mg via INTRAVENOUS
  Filled 2017-08-04: qty 1

## 2017-08-04 MED ORDER — SODIUM CHLORIDE 0.9 % IV SOLN
150.0000 mg | Freq: Once | INTRAVENOUS | Status: AC
  Administered 2017-08-04: 150 mg via INTRAVENOUS
  Filled 2017-08-04: qty 15

## 2017-08-04 MED ORDER — SODIUM CHLORIDE 0.9% FLUSH
10.0000 mL | INTRAVENOUS | Status: DC | PRN
  Filled 2017-08-04: qty 10

## 2017-08-04 MED ORDER — HEPARIN SOD (PORK) LOCK FLUSH 100 UNIT/ML IV SOLN
500.0000 [IU] | Freq: Once | INTRAVENOUS | Status: AC
  Administered 2017-08-04: 500 [IU] via INTRAVENOUS
  Filled 2017-08-04: qty 5

## 2017-08-04 MED ORDER — SODIUM CHLORIDE 0.9 % IV SOLN
Freq: Once | INTRAVENOUS | Status: AC
  Administered 2017-08-04: 12:00:00 via INTRAVENOUS
  Filled 2017-08-04: qty 1000

## 2017-08-04 MED ORDER — PALONOSETRON HCL INJECTION 0.25 MG/5ML
0.2500 mg | Freq: Once | INTRAVENOUS | Status: AC
  Administered 2017-08-04: 0.25 mg via INTRAVENOUS
  Filled 2017-08-04: qty 5

## 2017-08-04 MED ORDER — DIPHENHYDRAMINE HCL 50 MG/ML IJ SOLN
25.0000 mg | Freq: Once | INTRAMUSCULAR | Status: AC
  Administered 2017-08-04: 25 mg via INTRAVENOUS
  Filled 2017-08-04: qty 1

## 2017-08-04 MED ORDER — FAMOTIDINE IN NACL 20-0.9 MG/50ML-% IV SOLN
20.0000 mg | Freq: Once | INTRAVENOUS | Status: AC
  Administered 2017-08-04: 20 mg via INTRAVENOUS

## 2017-08-04 NOTE — Progress Notes (Signed)
Okay to proceed with sodium 127, NNO per Dr. Grayland Ormond.

## 2017-08-04 NOTE — Progress Notes (Signed)
Here for follow up. Stated overall she is doing well- ( no problems w chemo or radiation ) but feeling weak in general. Lives at [facility]

## 2017-08-05 ENCOUNTER — Ambulatory Visit
Admission: RE | Admit: 2017-08-05 | Discharge: 2017-08-05 | Disposition: A | Discharge status: Home / Self Care | Payer: Medicare Other | Source: Ambulatory Visit | Attending: Radiation Oncology | Admitting: Radiation Oncology

## 2017-08-05 DIAGNOSIS — Z510 Encounter for antineoplastic radiation therapy: Secondary | ICD-10-CM | POA: Diagnosis not present

## 2017-08-08 ENCOUNTER — Ambulatory Visit
Admission: RE | Admit: 2017-08-08 | Discharge: 2017-08-08 | Disposition: A | Discharge status: Home / Self Care | Payer: Medicare Other | Source: Ambulatory Visit | Attending: Radiation Oncology | Admitting: Radiation Oncology

## 2017-08-08 DIAGNOSIS — Z510 Encounter for antineoplastic radiation therapy: Secondary | ICD-10-CM | POA: Diagnosis not present

## 2017-08-09 ENCOUNTER — Ambulatory Visit
Admission: RE | Admit: 2017-08-09 | Discharge: 2017-08-09 | Disposition: A | Discharge status: Home / Self Care | Payer: Medicare Other | Source: Ambulatory Visit | Attending: Radiation Oncology | Admitting: Radiation Oncology

## 2017-08-09 DIAGNOSIS — Z510 Encounter for antineoplastic radiation therapy: Secondary | ICD-10-CM | POA: Diagnosis not present

## 2017-08-10 ENCOUNTER — Ambulatory Visit
Admission: RE | Admit: 2017-08-10 | Discharge: 2017-08-10 | Disposition: A | Discharge status: Home / Self Care | Payer: Medicare Other | Source: Ambulatory Visit | Attending: Radiation Oncology | Admitting: Radiation Oncology

## 2017-08-10 DIAGNOSIS — Z510 Encounter for antineoplastic radiation therapy: Secondary | ICD-10-CM | POA: Diagnosis not present

## 2017-08-11 ENCOUNTER — Inpatient Hospital Stay: Payer: Medicare Other

## 2017-08-11 ENCOUNTER — Inpatient Hospital Stay (HOSPITAL_BASED_OUTPATIENT_CLINIC_OR_DEPARTMENT_OTHER): Payer: Medicare Other | Admitting: Oncology

## 2017-08-11 ENCOUNTER — Encounter: Payer: Self-pay | Admitting: Oncology

## 2017-08-11 ENCOUNTER — Ambulatory Visit
Admission: RE | Admit: 2017-08-11 | Discharge: 2017-08-11 | Disposition: A | Discharge status: Home / Self Care | Payer: Medicare Other | Source: Ambulatory Visit | Attending: Radiation Oncology | Admitting: Radiation Oncology

## 2017-08-11 VITALS — BP 112/65 | HR 91 | Temp 99.2°F | Resp 18 | Wt 174.0 lb

## 2017-08-11 DIAGNOSIS — Z5111 Encounter for antineoplastic chemotherapy: Secondary | ICD-10-CM

## 2017-08-11 DIAGNOSIS — F419 Anxiety disorder, unspecified: Secondary | ICD-10-CM

## 2017-08-11 DIAGNOSIS — C3491 Malignant neoplasm of unspecified part of right bronchus or lung: Secondary | ICD-10-CM

## 2017-08-11 DIAGNOSIS — R1900 Intra-abdominal and pelvic swelling, mass and lump, unspecified site: Secondary | ICD-10-CM

## 2017-08-11 DIAGNOSIS — D649 Anemia, unspecified: Secondary | ICD-10-CM

## 2017-08-11 DIAGNOSIS — R0602 Shortness of breath: Secondary | ICD-10-CM

## 2017-08-11 DIAGNOSIS — C3411 Malignant neoplasm of upper lobe, right bronchus or lung: Secondary | ICD-10-CM

## 2017-08-11 DIAGNOSIS — Z510 Encounter for antineoplastic radiation therapy: Secondary | ICD-10-CM | POA: Diagnosis not present

## 2017-08-11 DIAGNOSIS — R197 Diarrhea, unspecified: Secondary | ICD-10-CM | POA: Diagnosis not present

## 2017-08-11 DIAGNOSIS — I872 Venous insufficiency (chronic) (peripheral): Secondary | ICD-10-CM

## 2017-08-11 DIAGNOSIS — K219 Gastro-esophageal reflux disease without esophagitis: Secondary | ICD-10-CM

## 2017-08-11 DIAGNOSIS — Z87891 Personal history of nicotine dependence: Secondary | ICD-10-CM

## 2017-08-11 DIAGNOSIS — I10 Essential (primary) hypertension: Secondary | ICD-10-CM

## 2017-08-11 DIAGNOSIS — R5383 Other fatigue: Secondary | ICD-10-CM

## 2017-08-11 DIAGNOSIS — Z79899 Other long term (current) drug therapy: Secondary | ICD-10-CM

## 2017-08-11 DIAGNOSIS — E871 Hypo-osmolality and hyponatremia: Secondary | ICD-10-CM | POA: Diagnosis not present

## 2017-08-11 DIAGNOSIS — R531 Weakness: Secondary | ICD-10-CM

## 2017-08-11 DIAGNOSIS — E039 Hypothyroidism, unspecified: Secondary | ICD-10-CM

## 2017-08-11 DIAGNOSIS — J449 Chronic obstructive pulmonary disease, unspecified: Secondary | ICD-10-CM

## 2017-08-11 DIAGNOSIS — R590 Localized enlarged lymph nodes: Secondary | ICD-10-CM

## 2017-08-11 DIAGNOSIS — M8580 Other specified disorders of bone density and structure, unspecified site: Secondary | ICD-10-CM

## 2017-08-11 DIAGNOSIS — K5900 Constipation, unspecified: Secondary | ICD-10-CM

## 2017-08-11 LAB — COMPREHENSIVE METABOLIC PANEL
ALT: 12 U/L (ref 0–44)
AST: 18 U/L (ref 15–41)
Albumin: 3.2 g/dL — ABNORMAL LOW (ref 3.5–5.0)
Alkaline Phosphatase: 57 U/L (ref 38–126)
Anion gap: 8 (ref 5–15)
BUN: 17 mg/dL (ref 8–23)
CHLORIDE: 105 mmol/L (ref 98–111)
CO2: 18 mmol/L — AB (ref 22–32)
Calcium: 8.4 mg/dL — ABNORMAL LOW (ref 8.9–10.3)
Creatinine, Ser: 0.8 mg/dL (ref 0.44–1.00)
Glucose, Bld: 112 mg/dL — ABNORMAL HIGH (ref 70–99)
POTASSIUM: 4.4 mmol/L (ref 3.5–5.1)
SODIUM: 131 mmol/L — AB (ref 135–145)
Total Bilirubin: 0.5 mg/dL (ref 0.3–1.2)
Total Protein: 6.6 g/dL (ref 6.5–8.1)

## 2017-08-11 LAB — CBC WITH DIFFERENTIAL/PLATELET
Basophils Absolute: 0 10*3/uL (ref 0–0.1)
Basophils Relative: 1 %
Eosinophils Absolute: 0 10*3/uL (ref 0–0.7)
Eosinophils Relative: 1 %
HCT: 29.6 % — ABNORMAL LOW (ref 35.0–47.0)
HEMOGLOBIN: 9.9 g/dL — AB (ref 12.0–16.0)
LYMPHS PCT: 8 %
Lymphs Abs: 0.2 10*3/uL — ABNORMAL LOW (ref 1.0–3.6)
MCH: 27.3 pg (ref 26.0–34.0)
MCHC: 33.5 g/dL (ref 32.0–36.0)
MCV: 81.5 fL (ref 80.0–100.0)
Monocytes Absolute: 0.5 10*3/uL (ref 0.2–0.9)
Monocytes Relative: 15 %
NEUTROS PCT: 75 %
Neutro Abs: 2.3 10*3/uL (ref 1.4–6.5)
PLATELETS: 295 10*3/uL (ref 150–440)
RBC: 3.63 MIL/uL — AB (ref 3.80–5.20)
RDW: 13.8 % (ref 11.5–14.5)
WBC: 3 10*3/uL — AB (ref 3.6–11.0)

## 2017-08-11 MED ORDER — SODIUM CHLORIDE 0.9% FLUSH
10.0000 mL | Freq: Once | INTRAVENOUS | Status: DC
  Filled 2017-08-11: qty 10

## 2017-08-11 MED ORDER — SODIUM CHLORIDE 0.9 % IV SOLN: 10.0000 mg | Freq: Once | INTRAVENOUS | Status: DC

## 2017-08-11 MED ORDER — PALONOSETRON HCL INJECTION 0.25 MG/5ML
0.2500 mg | Freq: Once | INTRAVENOUS | Status: AC
  Administered 2017-08-11: 0.25 mg via INTRAVENOUS
  Filled 2017-08-11: qty 5

## 2017-08-11 MED ORDER — CARBOPLATIN CHEMO INJECTION 450 MG/45ML
150.0000 mg | Freq: Once | INTRAVENOUS | Status: AC
  Administered 2017-08-11: 150 mg via INTRAVENOUS
  Filled 2017-08-11: qty 15

## 2017-08-11 MED ORDER — SODIUM CHLORIDE 0.9 % IV SOLN
Freq: Once | INTRAVENOUS | Status: AC
  Administered 2017-08-11: 11:00:00 via INTRAVENOUS
  Filled 2017-08-11: qty 1000

## 2017-08-11 MED ORDER — FAMOTIDINE IN NACL 20-0.9 MG/50ML-% IV SOLN
20.0000 mg | Freq: Once | INTRAVENOUS | Status: AC
  Administered 2017-08-11: 20 mg via INTRAVENOUS
  Filled 2017-08-11: qty 50

## 2017-08-11 MED ORDER — DIPHENHYDRAMINE HCL 50 MG/ML IJ SOLN
25.0000 mg | Freq: Once | INTRAMUSCULAR | Status: AC
  Administered 2017-08-11: 25 mg via INTRAVENOUS
  Filled 2017-08-11: qty 1

## 2017-08-11 MED ORDER — SODIUM CHLORIDE 0.9 % IV SOLN
45.0000 mg/m2 | Freq: Once | INTRAVENOUS | Status: AC
  Administered 2017-08-11: 90 mg via INTRAVENOUS
  Filled 2017-08-11: qty 15

## 2017-08-11 MED ORDER — HEPARIN SOD (PORK) LOCK FLUSH 100 UNIT/ML IV SOLN
500.0000 [IU] | Freq: Once | INTRAVENOUS | Status: AC
  Administered 2017-08-11: 500 [IU] via INTRAVENOUS
  Filled 2017-08-11: qty 5

## 2017-08-11 MED ORDER — DEXAMETHASONE SODIUM PHOSPHATE 10 MG/ML IJ SOLN
10.0000 mg | Freq: Once | INTRAMUSCULAR | Status: AC
  Administered 2017-08-11: 10 mg via INTRAVENOUS
  Filled 2017-08-11: qty 1

## 2017-08-11 NOTE — Progress Notes (Signed)
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  INTERVAL HISTORY: Patient returns to clinic today for further evaluation and consideration of cycle 3 of carbo/Taxol.  Her weakness and fatigue have significantly improved.  She tells me she is able to get around better at the nursing facility.  She is enjoying physical therapy.  she denies shortness of breath unless she is exerting herself and it resolves with rest.  She denies any neurological complaints, fevers, chest pain, cough or hemoptysis.  She denies nausea, vomiting, constipation or urinary complaints.  She has intermittent diarrhea with rotating solid and loose stools.  This started approximately 1 week ago.  Her appetite is improving.  REVIEW OF SYSTEMS:   Review of Systems  Constitutional: Negative.  Negative for chills, fever, malaise/fatigue and weight loss.  HENT: Negative for congestion and ear pain.   Eyes: Negative.  Negative for blurred vision and double vision.  Respiratory: Negative.  Negative for cough, sputum production and shortness of breath.   Cardiovascular: Negative.  Negative for chest pain, palpitations and leg swelling.  Gastrointestinal: Positive for diarrhea. Negative for abdominal pain, constipation, nausea and vomiting.  Genitourinary: Negative for dysuria, frequency and urgency.  Musculoskeletal: Negative for back pain and falls.  Skin: Negative.  Negative for rash.  Neurological: Negative.  Negative for weakness and headaches.  Endo/Heme/Allergies: Negative.  Does not bruise/bleed easily.  Psychiatric/Behavioral: Negative.  Negative for depression. The patient is not nervous/anxious and does not have  insomnia.     As per HPI. Otherwise, a complete review of systems is negative.  PAST MEDICAL HISTORY: Past Medical History:  Diagnosis Date  . Cataract   . Chronic constipation   . Chronic venous insufficiency   . COPD (chronic obstructive pulmonary disease) ([place])   . Essential hypertension, benign   . GERD (gastroesophageal reflux disease)   . Hypothyroidism   . Mood disorder ([place])    mostly anxiety  . Non-small cell lung cancer with metastasis ([place])   . Osteopenia   . Pancreatic mass   . Pancreatitis since 1961   chronic    PAST SURGICAL HISTORY: Past Surgical History:  Procedure Laterality Date  . APPENDECTOMY  1949  . CATARACT EXTRACTION Right 1987  . CATARACT EXTRACTION Left 1986  . detached retina repair Right 1991  . OOPHORECTOMY  1959   right ovary, part of left  . PORTA CATH INSERTION N/A 07/18/2017   Procedure: PORTA CATH INSERTION;  Surgeon: Algernon Huxley, MD;  Location: [facility];  Service: Cardiovascular;  Laterality: N/A;  . RETINAL DETACHMENT SURGERY Left 1997  . THORACOTOMY Left 2008   due to pneumonia  . uterine hysterectomy  1978    FAMILY HISTORY: Family History  Problem Relation Age of Onset  . Breast cancer Neg Hx     ADVANCED DIRECTIVES (Y/N):  N  HEALTH MAINTENANCE: Social History   Tobacco Use  . Smoking status: Former Smoker    Packs/day: 0.50    Years: 30.00    Pack years: 15.00    Types: Cigarettes    Last attempt to quit: 01/25/1993    Years since quitting: 24.5  . Smokeless tobacco: Never Used  Substance Use Topics  . Alcohol use:  No  . Drug use: No     Colonoscopy:  PAP:  Bone density:  Lipid panel:  Allergies  Allergen Reactions  . Amoxicillin     Has patient had a PCN reaction causing immediate rash, facial/tongue/throat swelling, SOB or lightheadedness with hypotension: Yes Has patient had a PCN reaction causing severe rash involving mucus membranes or skin necrosis: No Has patient had a PCN reaction  that required hospitalization: Unknown Has patient had a PCN reaction occurring within the last 10 years: Unknown If all of the above answers are "NO", then may proceed with Cephalosporin use.   . Codeine     vomiting  . Neosporin [Neomycin-Bacitracin Zn-Polymyx]   . Sulfa Antibiotics Rash    Current Outpatient Medications  Medication Sig Dispense Refill  . ALPRAZolam (XANAX) 0.5 MG tablet Take 0.5 mg by mouth 3 (three) times daily as needed for anxiety.    . lansoprazole (PREVACID) 30 MG capsule Take 30 mg by mouth daily at 12 noon.    Marland Kitchen levothyroxine (SYNTHROID, LEVOTHROID) 50 MCG tablet Take 50 mcg by mouth daily before breakfast.    . lidocaine-prilocaine (EMLA) cream Apply to affected area once 30 g 2  . megestrol (MEGACE) 40 MG tablet Take 1 tablet (40 mg total) by mouth daily. 30 tablet 0  . MICARDIS 40 MG tablet Take 40 mg by mouth daily.    . ondansetron (ZOFRAN) 8 MG tablet Take 1 tablet (8 mg total) by mouth 2 (two) times daily as needed for refractory nausea / vomiting. 30 tablet 2  . PARoxetine (PAXIL) 30 MG tablet Take 30 mg by mouth daily.     . prochlorperazine (COMPAZINE) 10 MG tablet Take 1 tablet (10 mg total) by mouth every 6 (six) hours as needed (Nausea or vomiting). 60 tablet 2  . umeclidinium-vilanterol (ANORO ELLIPTA) 62.5-25 MCG/INH AEPB Inhale 1 puff daily into the lungs. 180 each 3   No current facility-administered medications for this visit.    Facility-Administered Medications Ordered in Other Visits  Medication Dose Route Frequency Provider Last Rate Last Dose  . sodium chloride flush (NS) 0.9 % injection 10 mL  10 mL Intravenous Once Lloyd Huger, MD        OBJECTIVE: Vitals:   08/11/17 0936  BP: 112/65  Pulse: 91  Resp: 18  Temp: 99.2 F (37.3 C)  SpO2: 96%     Body mass index is 30.82 kg/m.    ECOG FS:1 - Symptomatic but completely ambulatory   Physical Exam  Constitutional: She is oriented to person, place, and time and  well-developed, well-nourished, and in no distress. Vital signs are normal.  HENT:  Head: Normocephalic and atraumatic.  Eyes: Pupils are equal, round, and reactive to light.  Neck: Normal range of motion.  Cardiovascular: Normal rate, regular rhythm and normal heart sounds.  No murmur heard. Pulmonary/Chest: Effort normal and breath sounds normal. She has no wheezes.  Abdominal: Soft. Normal appearance and bowel sounds are normal. She exhibits no distension. There is no tenderness.  Musculoskeletal: Normal range of motion. She exhibits no edema.  Neurological: She is alert and oriented to person, place, and time. Gait normal.  Skin: Skin is warm and dry. No rash noted.  Psychiatric: Mood, memory, affect and judgment normal.   LAB RESULTS:  Lab Results  Component Value Date   NA 131 (L) 08/11/2017   K 4.4 08/11/2017   CL 105 08/11/2017   CO2 18 (L) 08/11/2017   GLUCOSE 112 (H) 08/11/2017  BUN 17 08/11/2017   CREATININE 0.80 08/11/2017   CALCIUM 8.4 (L) 08/11/2017   PROT 6.6 08/11/2017   ALBUMIN 3.2 (L) 08/11/2017   AST 18 08/11/2017   ALT 12 08/11/2017   ALKPHOS 57 08/11/2017   BILITOT 0.5 08/11/2017   GFRNONAA >60 08/11/2017   GFRAA >60 08/11/2017    Lab Results  Component Value Date   WBC 3.0 (L) 08/11/2017   NEUTROABS 2.3 08/11/2017   HGB 9.9 (L) 08/11/2017   HCT 29.6 (L) 08/11/2017   MCV 81.5 08/11/2017   PLT 295 08/11/2017     STUDIES: Dg Chest 2 View  Result Date: 07/20/2017 CLINICAL DATA:  Pt having SOB still after having fluid drawn off of her right lung last week. Hx of right lung cancer, pneumonia, port a cath. Former smoker. She starts chemo tomorrow. EXAM: CHEST - 2 VIEW COMPARISON:  CT exam 07/11/2017 and chest x-ray 07/07/2017 FINDINGS: Patient has a RIGHT-sided PowerPort, tip overlying the level of the superior vena cava. No pneumothorax. RIGHT pleural effusion appears to have re-accumulated since thoracentesis, and is similar in volume compared to  prior PET-CT. Persistent RIGHT UPPER lobe mass. History of LEFT thoracotomy. Multiple healed rib fractures are present. IMPRESSION: 1. RIGHT pleural effusion similar to prior PET-CT exam, consistent with reaccumulation following thoracentesis. 2. RIGHT UPPER lobe mass. Electronically Signed   By: Nolon Nations M.D.   On: 07/20/2017 16:20   Dg Chest Port 1 View  Result Date: 07/25/2017 CLINICAL DATA:  Status post thoracentesis. EXAM: PORTABLE CHEST 1 VIEW COMPARISON:  07/20/2017 FINDINGS: Right pleural effusion has nearly resolved and there is no pneumothorax post right thoracentesis. Heart remains normal in size. Lungs remain hyperaerated. Hazy opacity at the left base is stable. Hazy opacity in the right upper lobe is stable. IMPRESSION: No pneumothorax post thoracentesis. Electronically Signed   By: Marybelle Killings M.D.   On: 07/25/2017 10:59   US Thoracentesis Asp Pleural Space W/img Guide  Result Date: 07/25/2017 INDICATION: Right pleural effusion EXAM: ULTRASOUND GUIDED RIGHT THORACENTESIS MEDICATIONS: None. COMPLICATIONS: None immediate. PROCEDURE: An ultrasound guided thoracentesis was thoroughly discussed with the patient and questions answered. The benefits, risks, alternatives and complications were also discussed. The patient understands and wishes to proceed with the procedure. Written consent was obtained. Ultrasound was performed to localize and mark an adequate pocket of fluid in the right chest. The area was then prepped and draped in the normal sterile fashion. 1% Lidocaine was used for local anesthesia. Under ultrasound guidance a 6 Fr Safe-T-Centesis catheter was introduced. Thoracentesis was performed. The catheter was removed and a dressing applied. FINDINGS: A total of approximately 1 L of dark reddish fluid was removed. IMPRESSION: Successful ultrasound guided right thoracentesis yielding 1 L of pleural fluid. Electronically Signed   By: Marybelle Killings M.D.   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  Imaging and pathology results are reviewed extensively.  Patient was also discussed at cancer conference.  She will benefit from concurrent chemotherapy along with XRT.  Because of patient's age and decreased performance status, radiation oncology plans to do 10 fractions of IMRT rather than a 6-week course.  Patient will complete XRT on August 12, 2017.     Squamous cell carcinoma lung, right ([place])   07/11/2017 Initial  Diagnosis    Squamous cell carcinoma lung, right ([place])      07/18/2017 Cancer Staging    Staging form: Lung, AJCC 8th Edition - Clinical stage from 07/18/2017: Stage IIIA (cT2b, cN2, cM0) - Signed by Lloyd Huger, MD on 07/18/2017      07/18/2017 -  Chemotherapy    The patient had palonosetron (ALOXI) injection 0.25 mg, 0.25 mg, Intravenous,  Once, 4 of 8 cycles Administration: 0.25 mg (07/21/2017), 0.25 mg (07/27/2017), 0.25 mg (08/04/2017), 0.25 mg (08/11/2017) CARBOplatin (PARAPLATIN) 160 mg in sodium chloride 0.9 % 100 mL chemo infusion, 160 mg (100 % of original dose 160.4 mg), Intravenous,  Once, 4 of 8 cycles Dose modification:   (original dose 160.4 mg, Cycle 1) Administration: 150 mg (07/27/2017), 150 mg (08/04/2017), 150 mg (08/11/2017) PACLitaxel (TAXOL) 90 mg in sodium chloride 0.9 % 250 mL chemo infusion (</= 80mg /m2), 45 mg/m2 = 90 mg, Intravenous,  Once, 4 of 8 cycles Administration: 90 mg (07/21/2017), 90 mg (07/27/2017), 90 mg (08/04/2017), 90 mg (08/11/2017)  for chemotherapy treatment.        Non-small cell lung cancer with metastasis ([place])    Initial Diagnosis    Non-small cell lung cancer with metastasis ([place])       PLAN:    1.  Clinical stage IIIa squamous cell carcinoma of the right upper lobe  lung:  Proceed with cycle 3 of weekly carboplatinum and Taxol today.  She will complete radiation tomorrow.  Spoke with Dr. Grayland Ormond and he recommends beginning Imfinzi every 2 weeks beginning next week.  She will continue this for 12 months.  Return to clinic in 1 week for further evaluation and cycle 1 of Imfinzi.    2.  Shortness of breath: States this is much better.  Has not had any other issues since recent thoracentesis which removed 1 L of fluids.      3.  Poor appetite: Albumin improving.  Is meeting with dietitian today.  4.  Hyponatremia: Sodium level improved and is 131 today.  Continue to stay adequately hydrated.  5.   Anxiety: Continue Xanax as prescribed.  6.  Anemia: Hemoglobin has remained stable and is 9.9 today.  Continue to monitor.  7.  Diarrhea: Asked her to collect sample and return to cancer center.  Orders placed for C. Difficile  and GI panel. If this continues, recommended Imodium OTC.  Denies fevers or abdominal pain.  Greater than 50% was spent in counseling and coordination of care with this patient including but not limited to discussion of the relevant topics above (See A&P) including, but not limited to diagnosis and management of acute and chronic medical conditions.   Patient expressed understanding and was in agreement with this plan. She also understands that She can call clinic at any time with any questions, concerns, or complaints.   Cancer Staging Squamous cell carcinoma lung, right ([place]) Staging form: Lung, AJCC 8th Edition - Clinical stage from 07/18/2017: Stage IIIA (cT2b, cN2, cM0) - Signed by Lloyd Huger, MD on 07/18/2017   Jacquelin Hawking, NP   08/11/2017 1:47 PM

## 2017-08-11 NOTE — Progress Notes (Signed)
Nutrition Assessment   Reason for Assessment:   Patient identified on Malnutrition screening report with weight loss and poor appetite  ASSESSMENT:  84 year old female with stage III lung cancer.  Past medical history of constipation, COPD, GERD, pancreatic mass.  Patient receiving chemotherapy and radiation therapy.   Met with patient during infusion, no other family member at chairside.  Woke patient up to meet with her.  Patient reports appetite has been decreased but still tries to eat.  Patient lives at [facility] with her husband.  Reports that she likes the food and they serve large portion sizes.  Reports that she mainly snacks during the day and has a big supper.  "I don't eat much meat."  Drinks premier protein shake about 1 time per day.  Has tried ensure and prefers premier protein.    Nutrition Focused Physical Exam: deferred  Medications: megace, zofran  Labs: reviewed  Anthropometrics:   Height: 63 inches Weight: 174 lb UBW: Noted 192 lb 12/13/16 BMI: 30  9% weight loss in 8 months, not significant   Estimated Energy Needs  Kcals: 1975-2300 calories Protein: 78-104 g/d Fluid: 2.3  L/d  NUTRITION DIAGNOSIS: Inadequate oral intake related to cancer and cancer related treatment as evidenced by 9% weight loss in the last 8 months   MALNUTRITION DIAGNOSIS: continue to monitor   INTERVENTION:  Discussed strategies to increase calories and protein Discuss alternative sources of protein besides meat Encourage patient to increase premier protein to 2 times per day. Can also add ice cream for milkshake for additional calories and protein.  Contact information provided.     MONITORING, EVALUATION, GOAL: weight trends, intake   NEXT VISIT: July 25 during infusion  Tameron Lama B. Zenia Resides, [place], [place] Registered Dietitian [phone] (pager)

## 2017-08-12 ENCOUNTER — Ambulatory Visit
Admission: RE | Admit: 2017-08-12 | Discharge: 2017-08-12 | Disposition: A | Discharge status: Home / Self Care | Payer: Medicare Other | Source: Ambulatory Visit | Attending: Radiation Oncology | Admitting: Radiation Oncology

## 2017-08-12 ENCOUNTER — Other Ambulatory Visit: Payer: Self-pay

## 2017-08-12 DIAGNOSIS — Z5111 Encounter for antineoplastic chemotherapy: Secondary | ICD-10-CM | POA: Diagnosis not present

## 2017-08-12 DIAGNOSIS — R197 Diarrhea, unspecified: Secondary | ICD-10-CM

## 2017-08-12 DIAGNOSIS — Z510 Encounter for antineoplastic radiation therapy: Secondary | ICD-10-CM | POA: Diagnosis not present

## 2017-08-12 LAB — GASTROINTESTINAL PANEL BY PCR, STOOL (REPLACES STOOL CULTURE)
ADENOVIRUS F40/41: NOT DETECTED
Astrovirus: NOT DETECTED
CAMPYLOBACTER SPECIES: NOT DETECTED
CRYPTOSPORIDIUM: NOT DETECTED
CYCLOSPORA CAYETANENSIS: NOT DETECTED
ENTEROPATHOGENIC E COLI (EPEC): NOT DETECTED
Entamoeba histolytica: NOT DETECTED
Enteroaggregative E coli (EAEC): NOT DETECTED
Enterotoxigenic E coli (ETEC): NOT DETECTED
Giardia lamblia: NOT DETECTED
Norovirus GI/GII: NOT DETECTED
PLESIMONAS SHIGELLOIDES: NOT DETECTED
Rotavirus A: NOT DETECTED
SALMONELLA SPECIES: NOT DETECTED
SHIGA LIKE TOXIN PRODUCING E COLI (STEC): NOT DETECTED
Sapovirus (I, II, IV, and V): NOT DETECTED
Shigella/Enteroinvasive E coli (EIEC): NOT DETECTED
Vibrio cholerae: NOT DETECTED
Vibrio species: NOT DETECTED
YERSINIA ENTEROCOLITICA: NOT DETECTED

## 2017-08-12 LAB — FUNGAL ORGANISM REFLEX

## 2017-08-12 LAB — FUNGUS CULTURE WITH STAIN

## 2017-08-12 LAB — C DIFFICILE QUICK SCREEN W PCR REFLEX
C DIFFICILE (CDIFF) INTERP: NOT DETECTED
C Diff antigen: NEGATIVE
C Diff toxin: NEGATIVE

## 2017-08-12 LAB — FUNGUS CULTURE RESULT

## 2017-08-15 ENCOUNTER — Ambulatory Visit: Payer: Medicare Other

## 2017-08-15 ENCOUNTER — Other Ambulatory Visit: Payer: Self-pay | Admitting: Oncology

## 2017-08-15 DIAGNOSIS — C3491 Malignant neoplasm of unspecified part of right bronchus or lung: Secondary | ICD-10-CM

## 2017-08-15 NOTE — Progress Notes (Signed)
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  INTERVAL HISTORY: Patient returns to clinic today for further evaluation and initiation of maintenance immunotherapy with Imfinzi.  She had several days of diarrhea this past week, but now feels this is resolving.  She otherwise feels well.  She has no neurologic complaints.  Her weakness and fatigue are improving.  She does not have any recurrence of her shortness of breath after her thoracentesis several weeks ago.  She denies any recent fevers or illnesses.  Her appetite continues to improve. She denies any chest pain, cough, or hemoptysis.  She denies any nausea, vomiting, constipation, or diarrhea. She has no urinary complaints.  Patient offers no further specific complaints today.  REVIEW OF SYSTEMS:   Review of Systems  Constitutional: Positive for malaise/fatigue. Negative for fever and weight loss.  Respiratory: Negative.  Negative for cough and shortness of breath.   Cardiovascular: Negative.  Negative for chest pain and leg swelling.  Gastrointestinal: Positive for diarrhea. Negative for abdominal pain and constipation.  Genitourinary: Negative.  Negative for dysuria.  Musculoskeletal: Negative.  Negative for back pain.  Skin: Negative.  Negative for rash.  Neurological: Positive for weakness. Negative for sensory change, focal weakness and headaches.  Psychiatric/Behavioral: Negative.  Negative for depression. The patient is not nervous/anxious.     As per HPI. Otherwise, a complete review of systems is negative.  PAST MEDICAL HISTORY: Past Medical History:  Diagnosis Date  . Cataract   . Chronic constipation   . Chronic  venous insufficiency   . COPD (chronic obstructive pulmonary disease) ([place])   . Essential hypertension, benign   . GERD (gastroesophageal reflux disease)   . Hypothyroidism   . Mood disorder ([place])    mostly anxiety  . Non-small cell lung cancer with metastasis ([place])   . Osteopenia   . Pancreatic mass   . Pancreatitis since 1961   chronic    PAST SURGICAL HISTORY: Past Surgical History:  Procedure Laterality Date  . APPENDECTOMY  1949  . CATARACT EXTRACTION Right 1987  . CATARACT EXTRACTION Left 1986  . detached retina repair Right 1991  . OOPHORECTOMY  1959   right ovary, part of left  . PORTA CATH INSERTION N/A 07/18/2017   Procedure: PORTA CATH INSERTION;  Surgeon: Algernon Huxley, MD;  Location: [facility];  Service: Cardiovascular;  Laterality: N/A;  . RETINAL DETACHMENT SURGERY Left 1997  . THORACOTOMY Left 2008   due to pneumonia  . uterine hysterectomy  1978    FAMILY HISTORY: Family History  Problem Relation Age of Onset  . Breast cancer Neg Hx     ADVANCED DIRECTIVES (Y/N):  N  HEALTH MAINTENANCE: Social History   Tobacco Use  . Smoking status: Former Smoker    Packs/day: 0.50    Years: 30.00    Pack years: 15.00    Types: Cigarettes    Last attempt to quit: 01/25/1993    Years since quitting: 24.5  . Smokeless tobacco: Never Used  Substance Use Topics  . Alcohol use: No  . Drug use: No     Colonoscopy:  PAP:  Bone density:  Lipid panel:  Allergies  Allergen Reactions  . Amoxicillin  Has patient had a PCN reaction causing immediate rash, facial/tongue/throat swelling, SOB or lightheadedness with hypotension: Yes Has patient had a PCN reaction causing severe rash involving mucus membranes or skin necrosis: No Has patient had a PCN reaction that required hospitalization: Unknown Has patient had a PCN reaction occurring within the last 10 years: Unknown If all of the above answers are "NO", then may proceed with Cephalosporin use.   .  Codeine     vomiting  . Neosporin [Neomycin-Bacitracin Zn-Polymyx]   . Sulfa Antibiotics Rash    Current Outpatient Medications  Medication Sig Dispense Refill  . ALPRAZolam (XANAX) 0.5 MG tablet Take 0.5 mg by mouth 3 (three) times daily as needed for anxiety.    . lansoprazole (PREVACID) 30 MG capsule Take 30 mg by mouth daily at 12 noon.    Marland Kitchen levothyroxine (SYNTHROID, LEVOTHROID) 50 MCG tablet Take 50 mcg by mouth daily before breakfast.    . megestrol (MEGACE) 40 MG tablet Take 1 tablet (40 mg total) by mouth daily. 30 tablet 0  . MICARDIS 40 MG tablet Take 40 mg by mouth daily.    Marland Kitchen PARoxetine (PAXIL) 30 MG tablet Take 30 mg by mouth daily.     Marland Kitchen umeclidinium-vilanterol (ANORO ELLIPTA) 62.5-25 MCG/INH AEPB Inhale 1 puff daily into the lungs. 180 each 3   No current facility-administered medications for this visit.     OBJECTIVE: Vitals:   08/18/17 0923 08/18/17 0929  BP:  104/65  Pulse:  91  Resp: 16   Temp:  98.1 F (36.7 C)     Body mass index is 30.86 kg/m.    ECOG FS:1 - Symptomatic but completely ambulatory   General: Well-developed, well-nourished, no acute distress.  Sitting in a wheelchair. Eyes: Pink conjunctiva, anicteric sclera. HEENT: Normocephalic, moist mucous membranes. Lungs: Clear to auscultation bilaterally. Heart: Regular rate and rhythm. No rubs, murmurs, or gallops. Abdomen: Soft, nontender, nondistended. No organomegaly noted, normoactive bowel sounds. Musculoskeletal: No edema, cyanosis, or clubbing. Neuro: Alert, answering all questions appropriately. Cranial nerves grossly intact. Skin: No rashes or petechiae noted. Psych: Normal affect.  LAB RESULTS:  Lab Results  Component Value Date   NA 133 (L) 08/18/2017   K 4.3 08/18/2017   CL 106 08/18/2017   CO2 18 (L) 08/18/2017   GLUCOSE 118 (H) 08/18/2017   BUN 18 08/18/2017   CREATININE 0.93 08/18/2017   CALCIUM 8.6 (L) 08/18/2017   PROT 6.6 08/18/2017   ALBUMIN 3.2 (L) 08/18/2017   AST  20 08/18/2017   ALT 15 08/18/2017   ALKPHOS 59 08/18/2017   BILITOT 0.5 08/18/2017   GFRNONAA 54 (L) 08/18/2017   GFRAA >60 08/18/2017    Lab Results  Component Value Date   WBC 2.5 (L) 08/18/2017   NEUTROABS 2.0 08/18/2017   HGB 9.7 (L) 08/18/2017   HCT 28.9 (L) 08/18/2017   MCV 82.1 08/18/2017   PLT 121 (L) 08/18/2017     STUDIES: Dg Chest 2 View  Result Date: 07/20/2017 CLINICAL DATA:  Pt having SOB still after having fluid drawn off of her right lung last week. Hx of right lung cancer, pneumonia, port a cath. Former smoker. She starts chemo tomorrow. EXAM: CHEST - 2 VIEW COMPARISON:  CT exam 07/11/2017 and chest x-ray 07/07/2017 FINDINGS: Patient has a RIGHT-sided PowerPort, tip overlying the level of the superior vena cava. No pneumothorax. RIGHT pleural effusion appears to have re-accumulated since thoracentesis, and is similar in volume compared to prior PET-CT. Persistent RIGHT UPPER lobe mass.  History of LEFT thoracotomy. Multiple healed rib fractures are present. IMPRESSION: 1. RIGHT pleural effusion similar to prior PET-CT exam, consistent with reaccumulation following thoracentesis. 2. RIGHT UPPER lobe mass. Electronically Signed   By: Nolon Nations M.D.   On: 07/20/2017 16:20   Dg Chest Port 1 View  Result Date: 07/25/2017 CLINICAL DATA:  Status post thoracentesis. EXAM: PORTABLE CHEST 1 VIEW COMPARISON:  07/20/2017 FINDINGS: Right pleural effusion has nearly resolved and there is no pneumothorax post right thoracentesis. Heart remains normal in size. Lungs remain hyperaerated. Hazy opacity at the left base is stable. Hazy opacity in the right upper lobe is stable. IMPRESSION: No pneumothorax post thoracentesis. Electronically Signed   By: Marybelle Killings M.D.   On: 07/25/2017 10:59   US Thoracentesis Asp Pleural Space W/img Guide  Result Date: 07/25/2017 INDICATION: Right pleural effusion EXAM: ULTRASOUND GUIDED RIGHT THORACENTESIS MEDICATIONS: None. COMPLICATIONS: None  immediate. PROCEDURE: An ultrasound guided thoracentesis was thoroughly discussed with the patient and questions answered. The benefits, risks, alternatives and complications were also discussed. The patient understands and wishes to proceed with the procedure. Written consent was obtained. Ultrasound was performed to localize and mark an adequate pocket of fluid in the right chest. The area was then prepped and draped in the normal sterile fashion. 1% Lidocaine was used for local anesthesia. Under ultrasound guidance a 6 Fr Safe-T-Centesis catheter was introduced. Thoracentesis was performed. The catheter was removed and a dressing applied. FINDINGS: A total of approximately 1 L of dark reddish fluid was removed. IMPRESSION: Successful ultrasound guided right thoracentesis yielding 1 L of pleural fluid. Electronically Signed   By: Marybelle Killings M.D.   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  PLAN:    1.  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  Imaging and pathology results are reviewed extensively.  Patient was also discussed at cancer conference.  Patient completed her accelerated course of XRT on August 12, 2017 along with her concurrent chemotherapy.  Proceed with maintenance Imfinzi every 2 weeks for up to 12 months.  Return to clinic in 2 weeks for further evaluation and consideration of cycle 2.   2.  Shortness of breath: Patient does not complain of this today.  Significantly improved after recent thoracentesis which removed 1 L of fluid.  If pleural effusion continues to recur, will consider Pleurx catheter in the future.   3.  Poor appetite: Improved.  Continue Megace as prescribed. 4.  Anxiety: Continue Xanax as prescribed. 5.  Hyponatremia: Improved with  sodium levels 133 today.. 6.  Anemia: Hemoglobin is decreased, but stable at 9.7.  Monitor. 7.  Pancytopenia: Likely residual from chemotherapy, monitor. 8.  Diarrhea: Improving, continue OTC treatments as needed.    Patient expressed understanding and was in agreement with this plan. She also understands that She can call clinic at any time with any questions, concerns, or complaints.   Cancer Staging Squamous cell carcinoma lung, right ([place]) Staging form: Lung, AJCC 8th Edition - Clinical stage from 07/18/2017: Stage IIIA (cT2b, cN2, cM0) - Signed by Lloyd Huger, MD on 07/18/2017   Lloyd Huger, MD   08/18/2017 10:12 AM

## 2017-08-15 NOTE — Progress Notes (Signed)
DISCONTINUE ON PATHWAY REGIMEN - Non-Small Cell Lung     Administer weekly:     Paclitaxel      Carboplatin   **Always confirm dose/schedule in your pharmacy ordering system**  REASON: Continuation Of Treatment PRIOR TREATMENT: EFE071: Carboplatin AUC=2 + Paclitaxel 45 mg/m2 Weekly During Radiation TREATMENT RESPONSE: Partial Response (PR)  START ON PATHWAY REGIMEN - Non-Small Cell Lung     A cycle is every 14 days:     Durvalumab   **Always confirm dose/schedule in your pharmacy ordering system**  Patient Characteristics: Stage III - Unresectable, PS = 0, 1 AJCC T Category: T2b Current Disease Status: No Distant Mets or Local Recurrence AJCC N Category: N2 AJCC M Category: M0 AJCC 8 Stage Grouping: IIIA Performance Status: PS = 0, 1 Intent of Therapy: Curative Intent, Discussed with Patient

## 2017-08-17 DIAGNOSIS — K921 Melena: Secondary | ICD-10-CM | POA: Diagnosis not present

## 2017-08-18 ENCOUNTER — Inpatient Hospital Stay: Payer: Medicare Other

## 2017-08-18 ENCOUNTER — Encounter: Payer: Self-pay | Admitting: Oncology

## 2017-08-18 ENCOUNTER — Inpatient Hospital Stay (HOSPITAL_BASED_OUTPATIENT_CLINIC_OR_DEPARTMENT_OTHER): Payer: Medicare Other | Admitting: Oncology

## 2017-08-18 ENCOUNTER — Other Ambulatory Visit: Payer: Self-pay

## 2017-08-18 VITALS — BP 104/65 | HR 91 | Temp 98.1°F | Resp 16 | Ht 63.0 in | Wt 174.2 lb

## 2017-08-18 DIAGNOSIS — J449 Chronic obstructive pulmonary disease, unspecified: Secondary | ICD-10-CM

## 2017-08-18 DIAGNOSIS — C3491 Malignant neoplasm of unspecified part of right bronchus or lung: Secondary | ICD-10-CM

## 2017-08-18 DIAGNOSIS — R197 Diarrhea, unspecified: Secondary | ICD-10-CM | POA: Diagnosis not present

## 2017-08-18 DIAGNOSIS — M8580 Other specified disorders of bone density and structure, unspecified site: Secondary | ICD-10-CM

## 2017-08-18 DIAGNOSIS — Z87891 Personal history of nicotine dependence: Secondary | ICD-10-CM

## 2017-08-18 DIAGNOSIS — I872 Venous insufficiency (chronic) (peripheral): Secondary | ICD-10-CM

## 2017-08-18 DIAGNOSIS — C3411 Malignant neoplasm of upper lobe, right bronchus or lung: Secondary | ICD-10-CM

## 2017-08-18 DIAGNOSIS — R1900 Intra-abdominal and pelvic swelling, mass and lump, unspecified site: Secondary | ICD-10-CM

## 2017-08-18 DIAGNOSIS — F419 Anxiety disorder, unspecified: Secondary | ICD-10-CM

## 2017-08-18 DIAGNOSIS — E871 Hypo-osmolality and hyponatremia: Secondary | ICD-10-CM | POA: Diagnosis not present

## 2017-08-18 DIAGNOSIS — D649 Anemia, unspecified: Secondary | ICD-10-CM

## 2017-08-18 DIAGNOSIS — Z5111 Encounter for antineoplastic chemotherapy: Secondary | ICD-10-CM

## 2017-08-18 DIAGNOSIS — E039 Hypothyroidism, unspecified: Secondary | ICD-10-CM

## 2017-08-18 DIAGNOSIS — R590 Localized enlarged lymph nodes: Secondary | ICD-10-CM

## 2017-08-18 DIAGNOSIS — K219 Gastro-esophageal reflux disease without esophagitis: Secondary | ICD-10-CM

## 2017-08-18 DIAGNOSIS — R5383 Other fatigue: Secondary | ICD-10-CM

## 2017-08-18 DIAGNOSIS — K5900 Constipation, unspecified: Secondary | ICD-10-CM

## 2017-08-18 DIAGNOSIS — Z79899 Other long term (current) drug therapy: Secondary | ICD-10-CM

## 2017-08-18 DIAGNOSIS — I10 Essential (primary) hypertension: Secondary | ICD-10-CM

## 2017-08-18 DIAGNOSIS — R0602 Shortness of breath: Secondary | ICD-10-CM

## 2017-08-18 DIAGNOSIS — R531 Weakness: Secondary | ICD-10-CM

## 2017-08-18 DIAGNOSIS — D61818 Other pancytopenia: Secondary | ICD-10-CM

## 2017-08-18 LAB — CBC WITH DIFFERENTIAL/PLATELET
BASOS PCT: 1 %
Basophils Absolute: 0 10*3/uL (ref 0–0.1)
EOS ABS: 0 10*3/uL (ref 0–0.7)
Eosinophils Relative: 1 %
HCT: 28.9 % — ABNORMAL LOW (ref 35.0–47.0)
Hemoglobin: 9.7 g/dL — ABNORMAL LOW (ref 12.0–16.0)
Lymphocytes Relative: 7 %
Lymphs Abs: 0.2 10*3/uL — ABNORMAL LOW (ref 1.0–3.6)
MCH: 27.6 pg (ref 26.0–34.0)
MCHC: 33.6 g/dL (ref 32.0–36.0)
MCV: 82.1 fL (ref 80.0–100.0)
MONO ABS: 0.3 10*3/uL (ref 0.2–0.9)
MONOS PCT: 11 %
Neutro Abs: 2 10*3/uL (ref 1.4–6.5)
Neutrophils Relative %: 80 %
Platelets: 121 10*3/uL — ABNORMAL LOW (ref 150–440)
RBC: 3.52 MIL/uL — ABNORMAL LOW (ref 3.80–5.20)
RDW: 14.5 % (ref 11.5–14.5)
WBC: 2.5 10*3/uL — ABNORMAL LOW (ref 3.6–11.0)

## 2017-08-18 LAB — COMPREHENSIVE METABOLIC PANEL
ALBUMIN: 3.2 g/dL — AB (ref 3.5–5.0)
ALK PHOS: 59 U/L (ref 38–126)
ALT: 15 U/L (ref 0–44)
AST: 20 U/L (ref 15–41)
Anion gap: 9 (ref 5–15)
BUN: 18 mg/dL (ref 8–23)
CALCIUM: 8.6 mg/dL — AB (ref 8.9–10.3)
CO2: 18 mmol/L — AB (ref 22–32)
CREATININE: 0.93 mg/dL (ref 0.44–1.00)
Chloride: 106 mmol/L (ref 98–111)
GFR calc Af Amer: >60 mL/min (ref >60)
GFR calc non Af Amer: 54 mL/min — ABNORMAL LOW (ref >60)
GLUCOSE: 118 mg/dL — AB (ref 70–99)
Potassium: 4.3 mmol/L (ref 3.5–5.1)
SODIUM: 133 mmol/L — AB (ref 135–145)
Total Bilirubin: 0.5 mg/dL (ref 0.3–1.2)
Total Protein: 6.6 g/dL (ref 6.5–8.1)

## 2017-08-18 MED ORDER — SODIUM CHLORIDE 0.9 % IV SOLN
740.0000 mg | Freq: Once | INTRAVENOUS | Status: AC
  Administered 2017-08-18: 740 mg via INTRAVENOUS
  Filled 2017-08-18: qty 10

## 2017-08-18 MED ORDER — SODIUM CHLORIDE 0.9 % IV SOLN
Freq: Once | INTRAVENOUS | Status: AC
  Administered 2017-08-18: 11:00:00 via INTRAVENOUS
  Filled 2017-08-18: qty 1000

## 2017-08-18 MED ORDER — HEPARIN SOD (PORK) LOCK FLUSH 100 UNIT/ML IV SOLN
500.0000 [IU] | Freq: Once | INTRAVENOUS | Status: AC | PRN
  Administered 2017-08-18: 500 [IU]
  Filled 2017-08-18: qty 5

## 2017-08-18 NOTE — Progress Notes (Signed)
Nutrition Follow-up:  Patient with lung cancer.  Current treatment of immunotherapy with imfinzi.    Met with patient today during infusion.  Patient reports that several days at the beginning of the week had diarrhea.  Reports that is has resolved.  Reports that she has been eating lighter meals this week (cottage cheese and jello) due to diarrhea.  Has not been drinking premier protein shakes.    Medications: reviewedc  Labs: reviewed  Anthropometrics:   Weight stable at 174lb  UBW of 192 lb 12/13/16   NUTRITION DIAGNOSIS: Inadequate oral intake continues   MALNUTRITION DIAGNOSIS: continue to monitor   INTERVENTION:   Encouraged patient to drink premier protein shake at least 2 daily for added nutrition. Discussed foods to avoid during diarrhea. Discussed current dosing of megace with MD for appetite stimulation.    MONITORING, EVALUATION, GOAL: weight trends, intake   NEXT VISIT: August 8 during infusion  Krisandra Bueno B. Zenia Resides, [place], [place] Registered Dietitian [phone] (pager)

## 2017-08-18 NOTE — Progress Notes (Signed)
Patient here for pre treatment check. No changes since her last visit.

## 2017-08-27 LAB — ACID FAST CULTURE WITH REFLEXED SENSITIVITIES: ACID FAST CULTURE - AFSCU3: NEGATIVE

## 2017-08-27 LAB — ACID FAST CULTURE WITH REFLEXED SENSITIVITIES (MYCOBACTERIA)

## 2017-08-29 ENCOUNTER — Encounter: Payer: Self-pay | Admitting: Oncology

## 2017-08-29 DIAGNOSIS — R0789 Other chest pain: Secondary | ICD-10-CM | POA: Diagnosis not present

## 2017-08-29 NOTE — Progress Notes (Signed)
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  INTERVAL HISTORY: Patient returns to clinic today for further evaluation and consideration of maintenance Imfinzi.  She tolerated her first infusion well without significant side effects.  She continues to have weakness and fatigue, but states this is improving and is able to tolerate more physical therapy.  She has no neurologic complaints.  She denies any recent fevers or illnesses.  She does not complain of shortness of breath today.  Her appetite continues to improve. She denies any chest pain, cough, or hemoptysis.  She denies any nausea, vomiting, constipation, or diarrhea. She has no urinary complaints.  Patient offers no further specific complaints today.  REVIEW OF SYSTEMS:   Review of Systems  Constitutional: Positive for malaise/fatigue. Negative for fever and weight loss.  Respiratory: Negative.  Negative for cough and shortness of breath.   Cardiovascular: Negative.  Negative for chest pain and leg swelling.  Gastrointestinal: Negative for abdominal pain, constipation and diarrhea.  Genitourinary: Negative.  Negative for dysuria.  Musculoskeletal: Negative.  Negative for back pain.  Skin: Negative.  Negative for rash.  Neurological: Positive for weakness. Negative for sensory change, focal weakness and headaches.  Psychiatric/Behavioral: Negative.  Negative for depression. The patient is not nervous/anxious.     As per HPI. Otherwise, a complete review of systems is negative.  PAST MEDICAL HISTORY: Past Medical History:  Diagnosis Date  . Cataract   . Chronic constipation   . Chronic venous insufficiency   . COPD  (chronic obstructive pulmonary disease) ([place])   . Essential hypertension, benign   . GERD (gastroesophageal reflux disease)   . Hypothyroidism   . Mood disorder ([place])    mostly anxiety  . Non-small cell lung cancer with metastasis ([place])   . Osteopenia   . Pancreatic mass   . Pancreatitis since 1961   chronic    PAST SURGICAL HISTORY: Past Surgical History:  Procedure Laterality Date  . APPENDECTOMY  1949  . CATARACT EXTRACTION Right 1987  . CATARACT EXTRACTION Left 1986  . detached retina repair Right 1991  . OOPHORECTOMY  1959   right ovary, part of left  . PORTA CATH INSERTION N/A 07/18/2017   Procedure: PORTA CATH INSERTION;  Surgeon: Algernon Huxley, MD;  Location: [facility];  Service: Cardiovascular;  Laterality: N/A;  . RETINAL DETACHMENT SURGERY Left 1997  . THORACOTOMY Left 2008   due to pneumonia  . uterine hysterectomy  1978    FAMILY HISTORY: Family History  Problem Relation Age of Onset  . Breast cancer Neg Hx     ADVANCED DIRECTIVES (Y/N):  N  HEALTH MAINTENANCE: Social History   Tobacco Use  . Smoking status: Former Smoker    Packs/day: 0.50    Years: 30.00    Pack years: 15.00    Types: Cigarettes    Last attempt to quit: 01/25/1993    Years since quitting: 24.6  . Smokeless tobacco: Never Used  Substance Use Topics  . Alcohol use: No  . Drug use: No     Colonoscopy:  PAP:  Bone density:  Lipid panel:  Allergies  Allergen Reactions  . Amoxicillin     Has patient had a PCN reaction  causing immediate rash, facial/tongue/throat swelling, SOB or lightheadedness with hypotension: Yes Has patient had a PCN reaction causing severe rash involving mucus membranes or skin necrosis: No Has patient had a PCN reaction that required hospitalization: Unknown Has patient had a PCN reaction occurring within the last 10 years: Unknown If all of the above answers are "NO", then may proceed with Cephalosporin use.   . Codeine     vomiting  .  Neosporin [Neomycin-Bacitracin Zn-Polymyx]   . Sulfa Antibiotics Rash    Current Outpatient Medications  Medication Sig Dispense Refill  . acetaminophen (TYLENOL) 325 MG tablet Take 650 mg by mouth every 4 (four) hours as needed.    . ALPRAZolam (XANAX) 0.5 MG tablet Take 0.5 mg by mouth 3 (three) times daily as needed for anxiety.    . lansoprazole (PREVACID) 30 MG capsule Take 30 mg by mouth daily at 12 noon.    Marland Kitchen levothyroxine (SYNTHROID, LEVOTHROID) 50 MCG tablet Take 50 mcg by mouth daily before breakfast.    . lidocaine-prilocaine (EMLA) cream Apply 1 application topically as needed.    . megestrol (MEGACE) 40 MG tablet Take 1 tablet (40 mg total) by mouth daily. 30 tablet 0  . MICARDIS 40 MG tablet Take 40 mg by mouth daily.    . ondansetron (ZOFRAN) 8 MG tablet Take 8 mg by mouth 2 (two) times daily as needed for nausea or vomiting.    Marland Kitchen PARoxetine (PAXIL) 30 MG tablet Take 30 mg by mouth daily.     . traMADol (ULTRAM) 50 MG tablet Take 50 mg by mouth every 4 (four) hours as needed.    . umeclidinium-vilanterol (ANORO ELLIPTA) 62.5-25 MCG/INH AEPB Inhale 1 puff daily into the lungs. 180 each 3   No current facility-administered medications for this visit.     OBJECTIVE: Vitals:   09/01/17 0842  BP: 116/67  Pulse: 88  Resp: 18  Temp: 98.6 F (37 C)  SpO2: 97%     Body mass index is 31.3 kg/m.    ECOG FS:1 - Symptomatic but completely ambulatory   General: Well-developed, well-nourished, no acute distress.  Sitting in a wheelchair. Eyes: Pink conjunctiva, anicteric sclera. HEENT: Normocephalic, moist mucous membranes. Lungs: Clear to auscultation bilaterally. Heart: Regular rate and rhythm. No rubs, murmurs, or gallops. Abdomen: Soft, nontender, nondistended. No organomegaly noted, normoactive bowel sounds. Musculoskeletal: No edema, cyanosis, or clubbing. Neuro: Alert, answering all questions appropriately. Cranial nerves grossly intact. Skin: No rashes or petechiae  noted. Psych: Normal affect.   LAB RESULTS:  Lab Results  Component Value Date   NA 133 (L) 09/01/2017   K 4.4 09/01/2017   CL 105 09/01/2017   CO2 18 (L) 09/01/2017   GLUCOSE 111 (H) 09/01/2017   BUN 16 09/01/2017   CREATININE 1.04 (H) 09/01/2017   CALCIUM 8.5 (L) 09/01/2017   PROT 7.0 09/01/2017   ALBUMIN 3.2 (L) 09/01/2017   AST 19 09/01/2017   ALT 14 09/01/2017   ALKPHOS 55 09/01/2017   BILITOT 0.4 09/01/2017   GFRNONAA 47 (L) 09/01/2017   GFRAA 54 (L) 09/01/2017    Lab Results  Component Value Date   WBC 2.1 (L) 09/01/2017   NEUTROABS 1.1 (L) 09/01/2017   HGB 9.2 (L) 09/01/2017   HCT 27.0 (L) 09/01/2017   MCV 84.6 09/01/2017   PLT 327 09/01/2017     STUDIES: No results found.  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  PLAN:  1.  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  Imaging and pathology results are reviewed extensively.  Patient was also discussed at cancer conference.  Patient completed her accelerated course of XRT on August 12, 2017 along with her concurrent chemotherapy.  Proceed with cycle 2 of maintenance Imfinzi every 2 weeks for up to 12 months.  Return to clinic in 2 weeks for further evaluation and consideration of cycle 3.  Plan to reimage with PET scan in mid September 2019.   2.  Shortness of breath: Patient does not complain of this today.  Patient's most recent thoracentesis on July 25, 2017 removed 1 L of fluid.     3.  Poor appetite: Improving.  Continue Megace as prescribed. 4.  Anxiety: Continue Xanax as prescribed. 5.  Hyponatremia: Chronic and unchanged, monitor. 6.  Anemia: Hemoglobin slowly declining and is now 9.2.  Monitor. 7.  Leukopenia: Mild, monitor. 8.  Diarrhea: Patient does not complain of this today.  Continue  OTC treatments as needed.    Patient expressed understanding and was in agreement with this plan. She also understands that She can call clinic at any time with any questions, concerns, or complaints.   Cancer Staging Squamous cell carcinoma lung, right ([place]) Staging form: Lung, AJCC 8th Edition - Clinical stage from 07/18/2017: Stage IIIA (cT2b, cN2, cM0) - Signed by Lloyd Huger, MD on 07/18/2017   Lloyd Huger, MD   09/02/2017 6:11 PM

## 2017-09-01 ENCOUNTER — Inpatient Hospital Stay: Payer: Medicare Other

## 2017-09-01 ENCOUNTER — Inpatient Hospital Stay (HOSPITAL_BASED_OUTPATIENT_CLINIC_OR_DEPARTMENT_OTHER): Payer: Medicare Other | Admitting: Oncology

## 2017-09-01 ENCOUNTER — Other Ambulatory Visit: Payer: Self-pay

## 2017-09-01 ENCOUNTER — Inpatient Hospital Stay: Payer: Medicare Other | Attending: Oncology

## 2017-09-01 ENCOUNTER — Encounter: Payer: Self-pay | Admitting: Oncology

## 2017-09-01 VITALS — BP 116/67 | HR 88 | Temp 98.6°F | Resp 18 | Wt 176.7 lb

## 2017-09-01 VITALS — BP 124/74 | HR 84 | Resp 20

## 2017-09-01 DIAGNOSIS — C3491 Malignant neoplasm of unspecified part of right bronchus or lung: Secondary | ICD-10-CM

## 2017-09-01 DIAGNOSIS — I872 Venous insufficiency (chronic) (peripheral): Secondary | ICD-10-CM

## 2017-09-01 DIAGNOSIS — I10 Essential (primary) hypertension: Secondary | ICD-10-CM

## 2017-09-01 DIAGNOSIS — D72819 Decreased white blood cell count, unspecified: Secondary | ICD-10-CM | POA: Diagnosis not present

## 2017-09-01 DIAGNOSIS — C3411 Malignant neoplasm of upper lobe, right bronchus or lung: Secondary | ICD-10-CM | POA: Insufficient documentation

## 2017-09-01 DIAGNOSIS — F419 Anxiety disorder, unspecified: Secondary | ICD-10-CM | POA: Insufficient documentation

## 2017-09-01 DIAGNOSIS — E871 Hypo-osmolality and hyponatremia: Secondary | ICD-10-CM | POA: Diagnosis not present

## 2017-09-01 DIAGNOSIS — J449 Chronic obstructive pulmonary disease, unspecified: Secondary | ICD-10-CM | POA: Diagnosis not present

## 2017-09-01 DIAGNOSIS — E039 Hypothyroidism, unspecified: Secondary | ICD-10-CM | POA: Insufficient documentation

## 2017-09-01 DIAGNOSIS — Z79899 Other long term (current) drug therapy: Secondary | ICD-10-CM

## 2017-09-01 DIAGNOSIS — K5909 Other constipation: Secondary | ICD-10-CM

## 2017-09-01 DIAGNOSIS — M8580 Other specified disorders of bone density and structure, unspecified site: Secondary | ICD-10-CM | POA: Insufficient documentation

## 2017-09-01 DIAGNOSIS — D649 Anemia, unspecified: Secondary | ICD-10-CM | POA: Diagnosis not present

## 2017-09-01 DIAGNOSIS — Z5112 Encounter for antineoplastic immunotherapy: Secondary | ICD-10-CM | POA: Insufficient documentation

## 2017-09-01 DIAGNOSIS — Z87891 Personal history of nicotine dependence: Secondary | ICD-10-CM | POA: Diagnosis not present

## 2017-09-01 DIAGNOSIS — J189 Pneumonia, unspecified organism: Secondary | ICD-10-CM | POA: Diagnosis not present

## 2017-09-01 LAB — CBC WITH DIFFERENTIAL/PLATELET
Basophils Absolute: 0 10*3/uL (ref 0–0.1)
Basophils Relative: 1 %
Eosinophils Absolute: 0.1 10*3/uL (ref 0–0.7)
Eosinophils Relative: 6 %
HEMATOCRIT: 27 % — AB (ref 35.0–47.0)
HEMOGLOBIN: 9.2 g/dL — AB (ref 12.0–16.0)
Lymphocytes Relative: 24 %
Lymphs Abs: 0.5 10*3/uL — ABNORMAL LOW (ref 1.0–3.6)
MCH: 28.9 pg (ref 26.0–34.0)
MCHC: 34.1 g/dL (ref 32.0–36.0)
MCV: 84.6 fL (ref 80.0–100.0)
MONOS PCT: 19 %
Monocytes Absolute: 0.4 10*3/uL (ref 0.2–0.9)
NEUTROS ABS: 1.1 10*3/uL — AB (ref 1.4–6.5)
NEUTROS PCT: 50 %
Platelets: 327 10*3/uL (ref 150–440)
RBC: 3.2 MIL/uL — ABNORMAL LOW (ref 3.80–5.20)
RDW: 20.9 % — ABNORMAL HIGH (ref 11.5–14.5)
WBC: 2.1 10*3/uL — ABNORMAL LOW (ref 3.6–11.0)

## 2017-09-01 LAB — COMPREHENSIVE METABOLIC PANEL
ALBUMIN: 3.2 g/dL — AB (ref 3.5–5.0)
ALT: 14 U/L (ref 0–44)
AST: 19 U/L (ref 15–41)
Alkaline Phosphatase: 55 U/L (ref 38–126)
Anion gap: 10 (ref 5–15)
BUN: 16 mg/dL (ref 8–23)
CHLORIDE: 105 mmol/L (ref 98–111)
CO2: 18 mmol/L — AB (ref 22–32)
Calcium: 8.5 mg/dL — ABNORMAL LOW (ref 8.9–10.3)
Creatinine, Ser: 1.04 mg/dL — ABNORMAL HIGH (ref 0.44–1.00)
GFR calc non Af Amer: 47 mL/min — ABNORMAL LOW (ref >60)
GFR, EST AFRICAN AMERICAN: 54 mL/min — AB (ref >60)
Glucose, Bld: 111 mg/dL — ABNORMAL HIGH (ref 70–99)
Potassium: 4.4 mmol/L (ref 3.5–5.1)
SODIUM: 133 mmol/L — AB (ref 135–145)
Total Bilirubin: 0.4 mg/dL (ref 0.3–1.2)
Total Protein: 7 g/dL (ref 6.5–8.1)

## 2017-09-01 MED ORDER — SODIUM CHLORIDE 0.9 % IV SOLN
740.0000 mg | Freq: Once | INTRAVENOUS | Status: AC
  Administered 2017-09-01: 740 mg via INTRAVENOUS
  Filled 2017-09-01: qty 10

## 2017-09-01 MED ORDER — SODIUM CHLORIDE 0.9 % IV SOLN
Freq: Once | INTRAVENOUS | Status: AC
  Administered 2017-09-01: 10:00:00 via INTRAVENOUS
  Filled 2017-09-01: qty 1000

## 2017-09-01 MED ORDER — HEPARIN SOD (PORK) LOCK FLUSH 100 UNIT/ML IV SOLN
500.0000 [IU] | Freq: Once | INTRAVENOUS | Status: AC | PRN
  Administered 2017-09-01: 500 [IU]

## 2017-09-01 NOTE — Progress Notes (Signed)
Patient here for follow up. She is c/o pain underneath right ribcage that has been going on for about 1 week. It get worse when she pushes.

## 2017-09-01 NOTE — Progress Notes (Signed)
Nutrition Follow-up:  Patient with lung cancer and pancreatic mass.  Current treatment of immunotherapy with imfinzi.  Followed by Dr. Grayland Arnold.  Met with patient during infusion.  Patient reports appetite is good.  Reports that she is eating 3 meals per day and including good sources of protein.  Patient reports that the food at [facility] is good and they have plenty of choices.  Drinking about 1/2 premier protein shake per day.    Reports diarrhea usually 1 episode for several days in row then no diarrhea or normal stool then diarrhea again.   No nausea per patient   Medications: reviewed  Labs: Na 133, glucose 111, creatinine 1.04  Anthropometrics:   Weight increased to 176 lb 11.2 oz today from 174 lb on 7/25.    NUTRITION DIAGNOSIS: Inadequate oral intake improved   MALNUTRITION DIAGNOSIS: continue to monitor   INTERVENTION:  Encouraged patient to continue to eat well balanced diet.   Patient reports will start taking imodium with diarrhea per MD recommendations Encouraged patient if weight starts to decline would recommend increasing premier protein shake    MONITORING, EVALUATION, GOAL: weight trends, intake   NEXT VISIT: as needed  Nicole Arnold, [place], [place] Registered Dietitian [phone] (pager)

## 2017-09-09 DIAGNOSIS — F39 Unspecified mood [affective] disorder: Secondary | ICD-10-CM

## 2017-09-09 DIAGNOSIS — I10 Essential (primary) hypertension: Secondary | ICD-10-CM | POA: Diagnosis not present

## 2017-09-09 DIAGNOSIS — E039 Hypothyroidism, unspecified: Secondary | ICD-10-CM

## 2017-09-09 DIAGNOSIS — J449 Chronic obstructive pulmonary disease, unspecified: Secondary | ICD-10-CM

## 2017-09-09 DIAGNOSIS — K219 Gastro-esophageal reflux disease without esophagitis: Secondary | ICD-10-CM

## 2017-09-09 DIAGNOSIS — C7800 Secondary malignant neoplasm of unspecified lung: Secondary | ICD-10-CM

## 2017-09-12 DIAGNOSIS — R0602 Shortness of breath: Secondary | ICD-10-CM | POA: Diagnosis not present

## 2017-09-13 NOTE — Progress Notes (Signed)
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  INTERVAL HISTORY: Patient returns to clinic today for further evaluation and consideration of cycle 3 of maintenance Imfinzi.  She is tolerating her treatments well without significant side effects.  She was recently diagnosed with pneumonia and is currently on antibiotics.  She has increased weakness and fatigue and has missed several treatments with physical therapy in the past week.  She has no neurologic complaints.  She denies any fevers.  She has noted increased shortness of breath, but denies any cough or chest pain.  Her appetite continues to improve.  She denies any nausea, vomiting, constipation, or diarrhea. She has no urinary complaints.  Patient offers no further specific complaints today.  REVIEW OF SYSTEMS:   Review of Systems  Constitutional: Positive for malaise/fatigue. Negative for fever and weight loss.  Respiratory: Positive for shortness of breath. Negative for cough.   Cardiovascular: Negative.  Negative for chest pain and leg swelling.  Gastrointestinal: Negative for abdominal pain, constipation and diarrhea.  Genitourinary: Negative.  Negative for dysuria.  Musculoskeletal: Negative.  Negative for back pain.  Skin: Negative.  Negative for rash.  Neurological: Positive for weakness. Negative for sensory change, focal weakness and headaches.  Psychiatric/Behavioral: Negative.  Negative for depression. The patient is not nervous/anxious.     As per HPI. Otherwise, a complete review of systems is negative.  PAST MEDICAL HISTORY: Past Medical History:  Diagnosis Date  . Cataract   . Chronic constipation   .  Chronic venous insufficiency   . COPD (chronic obstructive pulmonary disease) ([place])   . Essential hypertension, benign   . GERD (gastroesophageal reflux disease)   . Hypothyroidism   . Mood disorder ([place])    mostly anxiety  . Non-small cell lung cancer with metastasis ([place])   . Osteopenia   . Pancreatic mass   . Pancreatitis since 1961   chronic    PAST SURGICAL HISTORY: Past Surgical History:  Procedure Laterality Date  . APPENDECTOMY  1949  . CATARACT EXTRACTION Right 1987  . CATARACT EXTRACTION Left 1986  . detached retina repair Right 1991  . OOPHORECTOMY  1959   right ovary, part of left  . PORTA CATH INSERTION N/A 07/18/2017   Procedure: PORTA CATH INSERTION;  Surgeon: Algernon Huxley, MD;  Location: [facility];  Service: Cardiovascular;  Laterality: N/A;  . RETINAL DETACHMENT SURGERY Left 1997  . THORACOTOMY Left 2008   due to pneumonia  . uterine hysterectomy  1978    FAMILY HISTORY: Family History  Problem Relation Age of Onset  . Breast cancer Neg Hx     ADVANCED DIRECTIVES (Y/N):  N  HEALTH MAINTENANCE: Social History   Tobacco Use  . Smoking status: Former Smoker    Packs/day: 0.50    Years: 30.00    Pack years: 15.00    Types: Cigarettes    Last attempt to quit: 01/25/1993    Years since quitting: 24.6  . Smokeless tobacco: Never Used  Substance Use Topics  . Alcohol use: No  . Drug use: No     Colonoscopy:  PAP:  Bone density:  Lipid panel:  Allergies  Allergen Reactions  . Amoxicillin  Has patient had a PCN reaction causing immediate rash, facial/tongue/throat swelling, SOB or lightheadedness with hypotension: Yes Has patient had a PCN reaction causing severe rash involving mucus membranes or skin necrosis: No Has patient had a PCN reaction that required hospitalization: Unknown Has patient had a PCN reaction occurring within the last 10 years: Unknown If all of the above answers are "NO", then may proceed with Cephalosporin  use.   . Codeine     vomiting  . Neosporin [Neomycin-Bacitracin Zn-Polymyx]   . Sulfa Antibiotics Rash    Current Outpatient Medications  Medication Sig Dispense Refill  . ALPRAZolam (XANAX) 0.5 MG tablet Take 0.5 mg by mouth 3 (three) times daily as needed for anxiety.    . lansoprazole (PREVACID) 30 MG capsule Take 30 mg by mouth daily at 12 noon.    Marland Kitchen levofloxacin (LEVAQUIN) 250 MG tablet Take 250 mg by mouth daily.    Marland Kitchen levothyroxine (SYNTHROID, LEVOTHROID) 50 MCG tablet Take 50 mcg by mouth daily before breakfast.    . lidocaine-prilocaine (EMLA) cream Apply 1 application topically as needed.    . megestrol (MEGACE) 40 MG tablet Take 1 tablet (40 mg total) by mouth daily. 30 tablet 0  . MICARDIS 40 MG tablet Take 40 mg by mouth daily.    Marland Kitchen PARoxetine (PAXIL) 30 MG tablet Take 30 mg by mouth daily.     Marland Kitchen umeclidinium-vilanterol (ANORO ELLIPTA) 62.5-25 MCG/INH AEPB Inhale 1 puff daily into the lungs. 180 each 3  . acetaminophen (TYLENOL) 325 MG tablet Take 650 mg by mouth every 4 (four) hours as needed.    . furosemide (LASIX) 40 MG tablet Take 40 mg by mouth.    . levofloxacin (LEVAQUIN) 500 MG tablet Take 500 mg by mouth daily.    . ondansetron (ZOFRAN) 8 MG tablet Take 8 mg by mouth 2 (two) times daily as needed for nausea or vomiting.    . traMADol (ULTRAM) 50 MG tablet Take 50 mg by mouth every 4 (four) hours as needed.     No current facility-administered medications for this visit.    Facility-Administered Medications Ordered in Other Visits  Medication Dose Route Frequency Provider Last Rate Last Dose  . heparin lock flush 100 unit/mL  500 Units Intravenous Once Lloyd Huger, MD        OBJECTIVE: Vitals:   09/15/17 1005 09/15/17 1015  BP: 103/63 103/63  Pulse: 97 97  Resp: 18 18  Temp: (!) 97.3 F (36.3 C) (!) 97.3 F (36.3 C)     Body mass index is 31.16 kg/m.    ECOG FS:1 - Symptomatic but completely ambulatory   General: Well-developed,  well-nourished, no acute distress.  Sitting in wheelchair. Eyes: Pink conjunctiva, anicteric sclera. HEENT: Normocephalic, moist mucous membranes. Lungs: Clear to auscultation bilaterally. Heart: Regular rate and rhythm. No rubs, murmurs, or gallops. Abdomen: Soft, nontender, nondistended. No organomegaly noted, normoactive bowel sounds. Musculoskeletal: No edema, cyanosis, or clubbing. Neuro: Alert, answering all questions appropriately. Cranial nerves grossly intact. Skin: No rashes or petechiae noted. Psych: Normal affect.   LAB RESULTS:  Lab Results  Component Value Date   NA 130 (L) 09/15/2017   K 4.4 09/15/2017   CL 100 09/15/2017   CO2 19 (L) 09/15/2017   GLUCOSE 133 (H) 09/15/2017   BUN 18 09/15/2017   CREATININE 1.13 (H) 09/15/2017   CALCIUM 8.6 (L) 09/15/2017   PROT 7.4 09/15/2017   ALBUMIN 3.1 (L) 09/15/2017   AST 19 09/15/2017   ALT 13  09/15/2017   ALKPHOS 54 09/15/2017   BILITOT 0.7 09/15/2017   GFRNONAA 42 (L) 09/15/2017   GFRAA 49 (L) 09/15/2017    Lab Results  Component Value Date   WBC 7.1 09/15/2017   NEUTROABS 5.4 09/15/2017   HGB 9.5 (L) 09/15/2017   HCT 27.7 (L) 09/15/2017   MCV 84.9 09/15/2017   PLT 490 (H) 09/15/2017     STUDIES: No results found.  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  PLAN:    1.  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  Imaging and pathology results are reviewed extensively.  Patient was also discussed at cancer conference.  Patient completed her accelerated course of XRT on August 12, 2017 along with her concurrent chemotherapy.  Delay cycle 3 of maintenance Imfinzi today given her recent diagnosis of pneumonia.  Return to clinic in 1 week for further evaluation and reconsideration of treatment.  Restaging  PET scan has been scheduled for September 27, 2017 with follow-up on September 5th to discuss the results. 2.  Shortness of breath: Likely secondary to recent diagnosis of pneumonia.  Continue antibiotics as prescribed.    3.  Poor appetite: Improving.  Continue Megace as prescribed. 4.  Anxiety: Chronic and unchanged.  Continue Xanax as prescribed. 5.  Hyponatremia: Sodium slightly decreased at 130 today.  Monitor.   6.  Anemia: Hemoglobin decreased, but essentially stable at 9.5.  Monitor. 7.  Leukopenia: Resolved.   8.  Diarrhea: Patient does not complain of this today.  Continue OTC treatments as needed.    Patient expressed understanding and was in agreement with this plan. She also understands that She can call clinic at any time with any questions, concerns, or complaints.   Cancer Staging Squamous cell carcinoma lung, right ([place]) Staging form: Lung, AJCC 8th Edition - Clinical stage from 07/18/2017: Stage IIIA (cT2b, cN2, cM0) - Signed by Lloyd Huger, MD on 07/18/2017   Lloyd Huger, MD   09/16/2017 1:24 PM

## 2017-09-14 DIAGNOSIS — C3491 Malignant neoplasm of unspecified part of right bronchus or lung: Secondary | ICD-10-CM

## 2017-09-14 DIAGNOSIS — R0602 Shortness of breath: Secondary | ICD-10-CM | POA: Diagnosis not present

## 2017-09-14 DIAGNOSIS — F411 Generalized anxiety disorder: Secondary | ICD-10-CM | POA: Diagnosis not present

## 2017-09-15 ENCOUNTER — Inpatient Hospital Stay: Payer: Medicare Other

## 2017-09-15 ENCOUNTER — Other Ambulatory Visit: Payer: Self-pay

## 2017-09-15 ENCOUNTER — Inpatient Hospital Stay (HOSPITAL_BASED_OUTPATIENT_CLINIC_OR_DEPARTMENT_OTHER): Payer: Medicare Other | Admitting: Oncology

## 2017-09-15 VITALS — BP 103/63 | HR 97 | Temp 97.3°F | Resp 18 | Wt 175.9 lb

## 2017-09-15 DIAGNOSIS — K5909 Other constipation: Secondary | ICD-10-CM

## 2017-09-15 DIAGNOSIS — I10 Essential (primary) hypertension: Secondary | ICD-10-CM

## 2017-09-15 DIAGNOSIS — E871 Hypo-osmolality and hyponatremia: Secondary | ICD-10-CM | POA: Diagnosis not present

## 2017-09-15 DIAGNOSIS — D72819 Decreased white blood cell count, unspecified: Secondary | ICD-10-CM

## 2017-09-15 DIAGNOSIS — Z87891 Personal history of nicotine dependence: Secondary | ICD-10-CM

## 2017-09-15 DIAGNOSIS — J189 Pneumonia, unspecified organism: Secondary | ICD-10-CM

## 2017-09-15 DIAGNOSIS — I872 Venous insufficiency (chronic) (peripheral): Secondary | ICD-10-CM

## 2017-09-15 DIAGNOSIS — D649 Anemia, unspecified: Secondary | ICD-10-CM

## 2017-09-15 DIAGNOSIS — C3411 Malignant neoplasm of upper lobe, right bronchus or lung: Secondary | ICD-10-CM | POA: Diagnosis not present

## 2017-09-15 DIAGNOSIS — Z5112 Encounter for antineoplastic immunotherapy: Secondary | ICD-10-CM | POA: Diagnosis not present

## 2017-09-15 DIAGNOSIS — C3491 Malignant neoplasm of unspecified part of right bronchus or lung: Secondary | ICD-10-CM

## 2017-09-15 DIAGNOSIS — J449 Chronic obstructive pulmonary disease, unspecified: Secondary | ICD-10-CM

## 2017-09-15 DIAGNOSIS — Z79899 Other long term (current) drug therapy: Secondary | ICD-10-CM

## 2017-09-15 DIAGNOSIS — M8580 Other specified disorders of bone density and structure, unspecified site: Secondary | ICD-10-CM

## 2017-09-15 DIAGNOSIS — E039 Hypothyroidism, unspecified: Secondary | ICD-10-CM

## 2017-09-15 DIAGNOSIS — F419 Anxiety disorder, unspecified: Secondary | ICD-10-CM

## 2017-09-15 LAB — CBC WITH DIFFERENTIAL/PLATELET
BASOS ABS: 0 10*3/uL (ref 0–0.1)
BASOS PCT: 1 %
EOS ABS: 0.1 10*3/uL (ref 0–0.7)
Eosinophils Relative: 1 %
HEMATOCRIT: 27.7 % — AB (ref 35.0–47.0)
Hemoglobin: 9.5 g/dL — ABNORMAL LOW (ref 12.0–16.0)
Lymphocytes Relative: 8 %
Lymphs Abs: 0.6 10*3/uL — ABNORMAL LOW (ref 1.0–3.6)
MCH: 28.9 pg (ref 26.0–34.0)
MCHC: 34.1 g/dL (ref 32.0–36.0)
MCV: 84.9 fL (ref 80.0–100.0)
MONO ABS: 1 10*3/uL — AB (ref 0.2–0.9)
Monocytes Relative: 14 %
NEUTROS ABS: 5.4 10*3/uL (ref 1.4–6.5)
Neutrophils Relative %: 76 %
PLATELETS: 490 10*3/uL — AB (ref 150–440)
RBC: 3.27 MIL/uL — ABNORMAL LOW (ref 3.80–5.20)
RDW: 22.5 % — AB (ref 11.5–14.5)
WBC: 7.1 10*3/uL (ref 3.6–11.0)

## 2017-09-15 LAB — COMPREHENSIVE METABOLIC PANEL
ALBUMIN: 3.1 g/dL — AB (ref 3.5–5.0)
ALT: 13 U/L (ref 0–44)
ANION GAP: 11 (ref 5–15)
AST: 19 U/L (ref 15–41)
Alkaline Phosphatase: 54 U/L (ref 38–126)
BILIRUBIN TOTAL: 0.7 mg/dL (ref 0.3–1.2)
BUN: 18 mg/dL (ref 8–23)
CO2: 19 mmol/L — ABNORMAL LOW (ref 22–32)
CREATININE: 1.13 mg/dL — AB (ref 0.44–1.00)
Calcium: 8.6 mg/dL — ABNORMAL LOW (ref 8.9–10.3)
Chloride: 100 mmol/L (ref 98–111)
GFR calc non Af Amer: 42 mL/min — ABNORMAL LOW (ref >60)
GFR, EST AFRICAN AMERICAN: 49 mL/min — AB (ref >60)
GLUCOSE: 133 mg/dL — AB (ref 70–99)
Potassium: 4.4 mmol/L (ref 3.5–5.1)
Sodium: 130 mmol/L — ABNORMAL LOW (ref 135–145)
TOTAL PROTEIN: 7.4 g/dL (ref 6.5–8.1)

## 2017-09-15 MED ORDER — HEPARIN SOD (PORK) LOCK FLUSH 100 UNIT/ML IV SOLN: 500.0000 [IU] | Freq: Once | INTRAVENOUS | Status: AC

## 2017-09-15 MED ORDER — SODIUM CHLORIDE 0.9% FLUSH
10.0000 mL | Freq: Once | INTRAVENOUS | Status: AC
  Administered 2017-09-15: 10 mL via INTRAVENOUS
  Filled 2017-09-15: qty 10

## 2017-09-15 NOTE — Progress Notes (Signed)
Here for follow up. Per notes from [facility] pt dx w pneumonia -given meds -feeling better per pt

## 2017-09-18 NOTE — Progress Notes (Deleted)
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  INTERVAL HISTORY: Patient returns to clinic today for further evaluation and consideration of cycle 3 of maintenance Imfinzi.  She is tolerating her treatments well without significant side effects.  She was recently diagnosed with pneumonia and is currently on antibiotics.  She has increased weakness and fatigue and has missed several treatments with physical therapy in the past week.  She has no neurologic complaints.  She denies any fevers.  She has noted increased shortness of breath, but denies any cough or chest pain.  Her appetite continues to improve.  She denies any nausea, vomiting, constipation, or diarrhea. She has no urinary complaints.  Patient offers no further specific complaints today.  REVIEW OF SYSTEMS:   Review of Systems  Constitutional: Positive for malaise/fatigue. Negative for fever and weight loss.  Respiratory: Positive for shortness of breath. Negative for cough.   Cardiovascular: Negative.  Negative for chest pain and leg swelling.  Gastrointestinal: Negative for abdominal pain, constipation and diarrhea.  Genitourinary: Negative.  Negative for dysuria.  Musculoskeletal: Negative.  Negative for back pain.  Skin: Negative.  Negative for rash.  Neurological: Positive for weakness. Negative for sensory change, focal weakness and headaches.  Psychiatric/Behavioral: Negative.  Negative for depression. The patient is not nervous/anxious.     As per HPI. Otherwise, a complete review of systems is negative.  PAST MEDICAL HISTORY: Past Medical History:  Diagnosis Date  . Cataract   . Chronic constipation   .  Chronic venous insufficiency   . COPD (chronic obstructive pulmonary disease) ([place])   . Essential hypertension, benign   . GERD (gastroesophageal reflux disease)   . Hypothyroidism   . Mood disorder ([place])    mostly anxiety  . Non-small cell lung cancer with metastasis ([place])   . Osteopenia   . Pancreatic mass   . Pancreatitis since 1961   chronic    PAST SURGICAL HISTORY: Past Surgical History:  Procedure Laterality Date  . APPENDECTOMY  1949  . CATARACT EXTRACTION Right 1987  . CATARACT EXTRACTION Left 1986  . detached retina repair Right 1991  . OOPHORECTOMY  1959   right ovary, part of left  . PORTA CATH INSERTION N/A 07/18/2017   Procedure: PORTA CATH INSERTION;  Surgeon: Algernon Huxley, MD;  Location: [facility];  Service: Cardiovascular;  Laterality: N/A;  . RETINAL DETACHMENT SURGERY Left 1997  . THORACOTOMY Left 2008   due to pneumonia  . uterine hysterectomy  1978    FAMILY HISTORY: Family History  Problem Relation Age of Onset  . Breast cancer Neg Hx     ADVANCED DIRECTIVES (Y/N):  N  HEALTH MAINTENANCE: Social History   Tobacco Use  . Smoking status: Former Smoker    Packs/day: 0.50    Years: 30.00    Pack years: 15.00    Types: Cigarettes    Last attempt to quit: 01/25/1993    Years since quitting: 24.6  . Smokeless tobacco: Never Used  Substance Use Topics  . Alcohol use: No  . Drug use: No     Colonoscopy:  PAP:  Bone density:  Lipid panel:  Allergies  Allergen Reactions  . Amoxicillin  Has patient had a PCN reaction causing immediate rash, facial/tongue/throat swelling, SOB or lightheadedness with hypotension: Yes Has patient had a PCN reaction causing severe rash involving mucus membranes or skin necrosis: No Has patient had a PCN reaction that required hospitalization: Unknown Has patient had a PCN reaction occurring within the last 10 years: Unknown If all of the above answers are "NO", then may proceed with Cephalosporin  use.   . Codeine     vomiting  . Neosporin [Neomycin-Bacitracin Zn-Polymyx]   . Sulfa Antibiotics Rash    Current Outpatient Medications  Medication Sig Dispense Refill  . acetaminophen (TYLENOL) 325 MG tablet Take 650 mg by mouth every 4 (four) hours as needed.    . ALPRAZolam (XANAX) 0.5 MG tablet Take 0.5 mg by mouth 3 (three) times daily as needed for anxiety.    . furosemide (LASIX) 40 MG tablet Take 40 mg by mouth.    . lansoprazole (PREVACID) 30 MG capsule Take 30 mg by mouth daily at 12 noon.    Marland Kitchen levofloxacin (LEVAQUIN) 250 MG tablet Take 250 mg by mouth daily.    Marland Kitchen levofloxacin (LEVAQUIN) 500 MG tablet Take 500 mg by mouth daily.    Marland Kitchen levothyroxine (SYNTHROID, LEVOTHROID) 50 MCG tablet Take 50 mcg by mouth daily before breakfast.    . lidocaine-prilocaine (EMLA) cream Apply 1 application topically as needed.    . megestrol (MEGACE) 40 MG tablet Take 1 tablet (40 mg total) by mouth daily. 30 tablet 0  . MICARDIS 40 MG tablet Take 40 mg by mouth daily.    . ondansetron (ZOFRAN) 8 MG tablet Take 8 mg by mouth 2 (two) times daily as needed for nausea or vomiting.    Marland Kitchen PARoxetine (PAXIL) 30 MG tablet Take 30 mg by mouth daily.     . traMADol (ULTRAM) 50 MG tablet Take 50 mg by mouth every 4 (four) hours as needed.    . umeclidinium-vilanterol (ANORO ELLIPTA) 62.5-25 MCG/INH AEPB Inhale 1 puff daily into the lungs. 180 each 3   No current facility-administered medications for this visit.    Facility-Administered Medications Ordered in Other Visits  Medication Dose Route Frequency Provider Last Rate Last Dose  . heparin lock flush 100 unit/mL  500 Units Intravenous Once Grayland Ormond Kathlene November, MD        OBJECTIVE: There were no vitals filed for this visit.   There is no height or weight on file to calculate BMI.    ECOG FS:1 - Symptomatic but completely ambulatory   General: Well-developed, well-nourished, no acute distress.  Sitting in wheelchair. Eyes: Pink conjunctiva,  anicteric sclera. HEENT: Normocephalic, moist mucous membranes. Lungs: Clear to auscultation bilaterally. Heart: Regular rate and rhythm. No rubs, murmurs, or gallops. Abdomen: Soft, nontender, nondistended. No organomegaly noted, normoactive bowel sounds. Musculoskeletal: No edema, cyanosis, or clubbing. Neuro: Alert, answering all questions appropriately. Cranial nerves grossly intact. Skin: No rashes or petechiae noted. Psych: Normal affect.   LAB RESULTS:  Lab Results  Component Value Date   NA 130 (L) 09/15/2017   K 4.4 09/15/2017   CL 100 09/15/2017   CO2 19 (L) 09/15/2017   GLUCOSE 133 (H) 09/15/2017   BUN 18 09/15/2017   CREATININE 1.13 (H) 09/15/2017   CALCIUM 8.6 (L) 09/15/2017   PROT 7.4 09/15/2017   ALBUMIN 3.1 (L) 09/15/2017   AST 19 09/15/2017   ALT 13 09/15/2017   ALKPHOS 54 09/15/2017   BILITOT 0.7 09/15/2017   GFRNONAA 42 (L) 09/15/2017   GFRAA  49 (L) 09/15/2017    Lab Results  Component Value Date   WBC 7.1 09/15/2017   NEUTROABS 5.4 09/15/2017   HGB 9.5 (L) 09/15/2017   HCT 27.7 (L) 09/15/2017   MCV 84.9 09/15/2017   PLT 490 (H) 09/15/2017     STUDIES: No results found.  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  PLAN:    1.  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  Imaging and pathology results are reviewed extensively.  Patient was also discussed at cancer conference.  Patient completed her accelerated course of XRT on August 12, 2017 along with her concurrent chemotherapy.  Delay cycle 3 of maintenance Imfinzi today given her recent diagnosis of pneumonia.  Return to clinic in 1 week for further evaluation and reconsideration of treatment.  Restaging PET scan has been scheduled for September 27, 2017 with follow-up on September 5th to  discuss the results. 2.  Shortness of breath: Likely secondary to recent diagnosis of pneumonia.  Continue antibiotics as prescribed.    3.  Poor appetite: Improving.  Continue Megace as prescribed. 4.  Anxiety: Chronic and unchanged.  Continue Xanax as prescribed. 5.  Hyponatremia: Sodium slightly decreased at 130 today.  Monitor.   6.  Anemia: Hemoglobin decreased, but essentially stable at 9.5.  Monitor. 7.  Leukopenia: Resolved.   8.  Diarrhea: Patient does not complain of this today.  Continue OTC treatments as needed.    Patient expressed understanding and was in agreement with this plan. She also understands that She can call clinic at any time with any questions, concerns, or complaints.   Cancer Staging Squamous cell carcinoma lung, right ([place]) Staging form: Lung, AJCC 8th Edition - Clinical stage from 07/18/2017: Stage IIIA (cT2b, cN2, cM0) - Signed by Lloyd Huger, MD on 07/18/2017   Lloyd Huger, MD   09/18/2017 11:19 AM

## 2017-09-20 DIAGNOSIS — R0789 Other chest pain: Secondary | ICD-10-CM

## 2017-09-22 ENCOUNTER — Other Ambulatory Visit: Payer: Self-pay

## 2017-09-22 ENCOUNTER — Ambulatory Visit: Payer: Medicare Other | Admitting: Radiation Oncology

## 2017-09-22 ENCOUNTER — Inpatient Hospital Stay
Admission: EM | Admit: 2017-09-22 | Discharge: 2017-09-26 | DRG: 194 | Disposition: A | Discharge status: Skilled Nursing Facility | Payer: Medicare Other | Attending: Internal Medicine | Admitting: Internal Medicine

## 2017-09-22 ENCOUNTER — Inpatient Hospital Stay: Payer: Medicare Other | Admitting: Oncology

## 2017-09-22 ENCOUNTER — Inpatient Hospital Stay: Payer: Medicare Other

## 2017-09-22 ENCOUNTER — Emergency Department: Payer: Medicare Other

## 2017-09-22 ENCOUNTER — Encounter: Payer: Self-pay | Admitting: Emergency Medicine

## 2017-09-22 DIAGNOSIS — M8580 Other specified disorders of bone density and structure, unspecified site: Secondary | ICD-10-CM | POA: Diagnosis not present

## 2017-09-22 DIAGNOSIS — K219 Gastro-esophageal reflux disease without esophagitis: Secondary | ICD-10-CM | POA: Diagnosis not present

## 2017-09-22 DIAGNOSIS — C3431 Malignant neoplasm of lower lobe, right bronchus or lung: Secondary | ICD-10-CM | POA: Diagnosis present

## 2017-09-22 DIAGNOSIS — E039 Hypothyroidism, unspecified: Secondary | ICD-10-CM | POA: Diagnosis not present

## 2017-09-22 DIAGNOSIS — I872 Venous insufficiency (chronic) (peripheral): Secondary | ICD-10-CM

## 2017-09-22 DIAGNOSIS — K5900 Constipation, unspecified: Secondary | ICD-10-CM

## 2017-09-22 DIAGNOSIS — R630 Anorexia: Secondary | ICD-10-CM | POA: Diagnosis not present

## 2017-09-22 DIAGNOSIS — C3491 Malignant neoplasm of unspecified part of right bronchus or lung: Secondary | ICD-10-CM | POA: Diagnosis not present

## 2017-09-22 DIAGNOSIS — R0902 Hypoxemia: Secondary | ICD-10-CM

## 2017-09-22 DIAGNOSIS — F419 Anxiety disorder, unspecified: Secondary | ICD-10-CM | POA: Diagnosis not present

## 2017-09-22 DIAGNOSIS — R1900 Intra-abdominal and pelvic swelling, mass and lump, unspecified site: Secondary | ICD-10-CM

## 2017-09-22 DIAGNOSIS — Z9889 Other specified postprocedural states: Secondary | ICD-10-CM

## 2017-09-22 DIAGNOSIS — W1811XA Fall from or off toilet without subsequent striking against object, initial encounter: Secondary | ICD-10-CM | POA: Diagnosis present

## 2017-09-22 DIAGNOSIS — J449 Chronic obstructive pulmonary disease, unspecified: Secondary | ICD-10-CM | POA: Diagnosis not present

## 2017-09-22 DIAGNOSIS — K861 Other chronic pancreatitis: Secondary | ICD-10-CM | POA: Diagnosis present

## 2017-09-22 DIAGNOSIS — I10 Essential (primary) hypertension: Secondary | ICD-10-CM

## 2017-09-22 DIAGNOSIS — Z79899 Other long term (current) drug therapy: Secondary | ICD-10-CM

## 2017-09-22 DIAGNOSIS — Z87891 Personal history of nicotine dependence: Secondary | ICD-10-CM

## 2017-09-22 DIAGNOSIS — Z90710 Acquired absence of both cervix and uterus: Secondary | ICD-10-CM

## 2017-09-22 DIAGNOSIS — J440 Chronic obstructive pulmonary disease with acute lower respiratory infection: Secondary | ICD-10-CM | POA: Diagnosis present

## 2017-09-22 DIAGNOSIS — I509 Heart failure, unspecified: Secondary | ICD-10-CM

## 2017-09-22 DIAGNOSIS — Z66 Do not resuscitate: Secondary | ICD-10-CM | POA: Diagnosis present

## 2017-09-22 DIAGNOSIS — Z888 Allergy status to other drugs, medicaments and biological substances status: Secondary | ICD-10-CM | POA: Diagnosis not present

## 2017-09-22 DIAGNOSIS — I5030 Unspecified diastolic (congestive) heart failure: Secondary | ICD-10-CM | POA: Diagnosis not present

## 2017-09-22 DIAGNOSIS — Z9841 Cataract extraction status, right eye: Secondary | ICD-10-CM | POA: Diagnosis not present

## 2017-09-22 DIAGNOSIS — J189 Pneumonia, unspecified organism: Secondary | ICD-10-CM | POA: Diagnosis not present

## 2017-09-22 DIAGNOSIS — Z923 Personal history of irradiation: Secondary | ICD-10-CM

## 2017-09-22 DIAGNOSIS — J90 Pleural effusion, not elsewhere classified: Secondary | ICD-10-CM | POA: Diagnosis present

## 2017-09-22 DIAGNOSIS — E222 Syndrome of inappropriate secretion of antidiuretic hormone: Secondary | ICD-10-CM | POA: Diagnosis present

## 2017-09-22 DIAGNOSIS — Z9842 Cataract extraction status, left eye: Secondary | ICD-10-CM

## 2017-09-22 DIAGNOSIS — Z79890 Hormone replacement therapy (postmenopausal): Secondary | ICD-10-CM | POA: Diagnosis not present

## 2017-09-22 DIAGNOSIS — Z885 Allergy status to narcotic agent status: Secondary | ICD-10-CM | POA: Diagnosis not present

## 2017-09-22 DIAGNOSIS — Z9221 Personal history of antineoplastic chemotherapy: Secondary | ICD-10-CM

## 2017-09-22 DIAGNOSIS — Z882 Allergy status to sulfonamides status: Secondary | ICD-10-CM

## 2017-09-22 DIAGNOSIS — Z801 Family history of malignant neoplasm of trachea, bronchus and lung: Secondary | ICD-10-CM

## 2017-09-22 LAB — COMPREHENSIVE METABOLIC PANEL
ALBUMIN: 2.9 g/dL — AB (ref 3.5–5.0)
ALT: 11 U/L (ref 0–44)
AST: 16 U/L (ref 15–41)
Alkaline Phosphatase: 52 U/L (ref 38–126)
Anion gap: 11 (ref 5–15)
BUN: 15 mg/dL (ref 8–23)
CHLORIDE: 93 mmol/L — AB (ref 98–111)
CO2: 20 mmol/L — AB (ref 22–32)
Calcium: 8.4 mg/dL — ABNORMAL LOW (ref 8.9–10.3)
Creatinine, Ser: 0.74 mg/dL (ref 0.44–1.00)
GFR calc Af Amer: >60 mL/min (ref >60)
GFR calc non Af Amer: >60 mL/min (ref >60)
GLUCOSE: 89 mg/dL (ref 70–99)
Potassium: 4.2 mmol/L (ref 3.5–5.1)
SODIUM: 124 mmol/L — AB (ref 135–145)
TOTAL PROTEIN: 7 g/dL (ref 6.5–8.1)
Total Bilirubin: 0.6 mg/dL (ref 0.3–1.2)

## 2017-09-22 LAB — GLUCOSE, PLEURAL OR PERITONEAL FLUID: Glucose, Fluid: 94 mg/dL

## 2017-09-22 LAB — CBC
HCT: 27.8 % — ABNORMAL LOW (ref 35.0–47.0)
HEMOGLOBIN: 10 g/dL — AB (ref 12.0–16.0)
MCH: 29.8 pg (ref 26.0–34.0)
MCHC: 35.8 g/dL (ref 32.0–36.0)
MCV: 83.1 fL (ref 80.0–100.0)
Platelets: 451 10*3/uL — ABNORMAL HIGH (ref 150–440)
RBC: 3.35 MIL/uL — AB (ref 3.80–5.20)
RDW: 22.4 % — ABNORMAL HIGH (ref 11.5–14.5)
WBC: 9.4 10*3/uL (ref 3.6–11.0)

## 2017-09-22 LAB — PROTIME-INR
INR: 1.13
Prothrombin Time: 14.4 seconds (ref 11.4–15.2)

## 2017-09-22 LAB — LACTIC ACID, PLASMA: LACTIC ACID, VENOUS: 1 mmol/L (ref 0.5–1.9)

## 2017-09-22 LAB — LACTATE DEHYDROGENASE, PLEURAL OR PERITONEAL FLUID: LD FL: 192 U/L — AB (ref 3–23)

## 2017-09-22 LAB — APTT: APTT: 38 s — AB (ref 24–36)

## 2017-09-22 LAB — AMYLASE, PLEURAL OR PERITONEAL FLUID: AMYLASE FL: 107 U/L

## 2017-09-22 LAB — BODY FLUID CELL COUNT WITH DIFFERENTIAL
Eos, Fluid: 1 %
Lymphs, Fluid: 53 %
Monocyte-Macrophage-Serous Fluid: 18 %
NEUTROPHIL FLUID: 28 %
Other Cells, Fluid: 0 %
Total Nucleated Cell Count, Fluid: 3385 cu mm

## 2017-09-22 LAB — PROTEIN, PLEURAL OR PERITONEAL FLUID: Total protein, fluid: 4.8 g/dL

## 2017-09-22 LAB — ALBUMIN, PLEURAL OR PERITONEAL FLUID: Albumin, Fluid: 2.7 g/dL

## 2017-09-22 LAB — TROPONIN I

## 2017-09-22 LAB — STREP PNEUMONIAE URINARY ANTIGEN: Strep Pneumo Urinary Antigen: NEGATIVE

## 2017-09-22 LAB — MRSA PCR SCREENING: MRSA by PCR: NEGATIVE

## 2017-09-22 LAB — BRAIN NATRIURETIC PEPTIDE: B Natriuretic Peptide: 83 pg/mL (ref 0.0–100.0)

## 2017-09-22 LAB — TSH: TSH: 4.941 u[IU]/mL — ABNORMAL HIGH (ref 0.350–4.500)

## 2017-09-22 LAB — PROCALCITONIN: PROCALCITONIN: 0.1 ng/mL

## 2017-09-22 MED ORDER — PAROXETINE HCL 30 MG PO TABS
30.0000 mg | ORAL_TABLET | Freq: Every day | ORAL | Status: DC
  Administered 2017-09-22 – 2017-09-26 (×5): 30 mg via ORAL
  Filled 2017-09-22 (×5): qty 1

## 2017-09-22 MED ORDER — TRAMADOL HCL 50 MG PO TABS
50.0000 mg | ORAL_TABLET | Freq: Four times a day (QID) | ORAL | Status: DC | PRN
  Administered 2017-09-22: 16:00:00 50 mg via ORAL
  Filled 2017-09-22: qty 1

## 2017-09-22 MED ORDER — ONDANSETRON HCL 4 MG PO TABS: 4.0000 mg | ORAL_TABLET | Freq: Four times a day (QID) | ORAL | Status: DC | PRN

## 2017-09-22 MED ORDER — MORPHINE SULFATE (PF) 2 MG/ML IV SOLN
2.0000 mg | Freq: Once | INTRAVENOUS | Status: AC
  Administered 2017-09-22: 2 mg via INTRAVENOUS
  Filled 2017-09-22: qty 1

## 2017-09-22 MED ORDER — ACETAMINOPHEN 325 MG PO TABS
650.0000 mg | ORAL_TABLET | Freq: Four times a day (QID) | ORAL | Status: DC | PRN
  Administered 2017-09-25: 14:00:00 650 mg via ORAL
  Filled 2017-09-22: qty 2

## 2017-09-22 MED ORDER — LEVOTHYROXINE SODIUM 50 MCG PO TABS
50.0000 ug | ORAL_TABLET | Freq: Every day | ORAL | Status: DC
  Administered 2017-09-23 – 2017-09-26 (×4): 50 ug via ORAL
  Filled 2017-09-22 (×4): qty 1

## 2017-09-22 MED ORDER — SODIUM CHLORIDE 0.9 % IV SOLN
1.0000 g | Freq: Three times a day (TID) | INTRAVENOUS | Status: DC
  Administered 2017-09-22 – 2017-09-23 (×3): 1 g via INTRAVENOUS
  Filled 2017-09-22 (×5): qty 1

## 2017-09-22 MED ORDER — ONDANSETRON HCL 4 MG/2ML IJ SOLN
4.0000 mg | Freq: Once | INTRAMUSCULAR | Status: AC
  Administered 2017-09-22: 4 mg via INTRAVENOUS
  Filled 2017-09-22: qty 2

## 2017-09-22 MED ORDER — PANTOPRAZOLE SODIUM 40 MG PO TBEC
40.0000 mg | DELAYED_RELEASE_TABLET | Freq: Every day | ORAL | Status: DC
  Administered 2017-09-22 – 2017-09-26 (×5): 40 mg via ORAL
  Filled 2017-09-22 (×5): qty 1

## 2017-09-22 MED ORDER — ENOXAPARIN SODIUM 40 MG/0.4ML ~~LOC~~ SOLN
40.0000 mg | SUBCUTANEOUS | Status: DC
  Administered 2017-09-22: 40 mg via SUBCUTANEOUS
  Filled 2017-09-22: qty 0.4

## 2017-09-22 MED ORDER — ALPRAZOLAM 0.5 MG PO TABS
0.5000 mg | ORAL_TABLET | Freq: Four times a day (QID) | ORAL | Status: DC | PRN
  Administered 2017-09-22 – 2017-09-23 (×2): 0.5 mg via ORAL
  Filled 2017-09-22 (×2): qty 1

## 2017-09-22 MED ORDER — IRBESARTAN 150 MG PO TABS
150.0000 mg | ORAL_TABLET | Freq: Every day | ORAL | Status: DC
  Administered 2017-09-22 – 2017-09-26 (×4): 150 mg via ORAL
  Filled 2017-09-22 (×5): qty 1

## 2017-09-22 MED ORDER — LEVOFLOXACIN IN D5W 500 MG/100ML IV SOLN
500.0000 mg | Freq: Once | INTRAVENOUS | Status: AC
  Administered 2017-09-22: 500 mg via INTRAVENOUS
  Filled 2017-09-22: qty 100

## 2017-09-22 MED ORDER — SODIUM CHLORIDE 0.9 % IV SOLN
INTRAVENOUS | Status: DC
  Administered 2017-09-22 – 2017-09-23 (×2): via INTRAVENOUS

## 2017-09-22 MED ORDER — IPRATROPIUM-ALBUTEROL 0.5-2.5 (3) MG/3ML IN SOLN
3.0000 mL | Freq: Once | RESPIRATORY_TRACT | Status: AC
  Administered 2017-09-22: 3 mL via RESPIRATORY_TRACT
  Filled 2017-09-22: qty 3

## 2017-09-22 MED ORDER — TRAMADOL HCL 50 MG PO TABS
50.0000 mg | ORAL_TABLET | ORAL | Status: DC | PRN
  Administered 2017-09-22 – 2017-09-24 (×4): 50 mg via ORAL
  Filled 2017-09-22 (×6): qty 1

## 2017-09-22 MED ORDER — VANCOMYCIN HCL 10 G IV SOLR
1250.0000 mg | INTRAVENOUS | Status: DC
  Filled 2017-09-22: qty 1250

## 2017-09-22 MED ORDER — ACETAMINOPHEN 650 MG RE SUPP: 650.0000 mg | Freq: Four times a day (QID) | RECTAL | Status: DC | PRN

## 2017-09-22 MED ORDER — ALPRAZOLAM 0.5 MG PO TABS
0.5000 mg | ORAL_TABLET | Freq: Three times a day (TID) | ORAL | Status: DC | PRN
  Administered 2017-09-22: 16:00:00 0.5 mg via ORAL
  Filled 2017-09-22: qty 1

## 2017-09-22 MED ORDER — UMECLIDINIUM-VILANTEROL 62.5-25 MCG/INH IN AEPB
1.0000 | INHALATION_SPRAY | Freq: Every day | RESPIRATORY_TRACT | Status: DC
  Administered 2017-09-22 – 2017-09-26 (×5): 1 via RESPIRATORY_TRACT
  Filled 2017-09-22: qty 14

## 2017-09-22 MED ORDER — ONDANSETRON HCL 4 MG/2ML IJ SOLN: 4.0000 mg | Freq: Four times a day (QID) | INTRAMUSCULAR | Status: DC | PRN

## 2017-09-22 MED ORDER — VANCOMYCIN HCL IN DEXTROSE 1-5 GM/200ML-% IV SOLN
1000.0000 mg | Freq: Once | INTRAVENOUS | Status: AC
  Administered 2017-09-22: 10:00:00 1000 mg via INTRAVENOUS
  Filled 2017-09-22: qty 200

## 2017-09-22 MED ORDER — LIDOCAINE-PRILOCAINE 2.5-2.5 % EX CREA
TOPICAL_CREAM | Freq: Once | CUTANEOUS | Status: AC
  Administered 2017-09-22: 16:00:00 via TOPICAL
  Filled 2017-09-22: qty 5

## 2017-09-22 MED ORDER — ALPRAZOLAM 0.5 MG PO TABS
0.5000 mg | ORAL_TABLET | Freq: Three times a day (TID) | ORAL | Status: DC
Start: 2017-09-23 — End: 2017-09-22

## 2017-09-22 MED ORDER — MEGESTROL ACETATE 20 MG PO TABS
40.0000 mg | ORAL_TABLET | Freq: Every day | ORAL | Status: DC
  Administered 2017-09-22 – 2017-09-26 (×5): 40 mg via ORAL
  Filled 2017-09-22 (×5): qty 2

## 2017-09-22 NOTE — Consult Note (Signed)
[place]  [phone] [phone]  ID: Nicole Arnold OB: 1928-08-07  MR#: [id number]  LZJ#:[id number]  Patient Care Team: Venia Carbon, MD as PCP - General (Internal Medicine) Telford Nab, RN as Registered Nurse  CHIEF COMPLAINT: Squamous cell carcinoma of the lung, shortness of breath secondary to pleural effusion.  INTERVAL HISTORY: Patient is an 84 year old female actively retrieving treatment with immunotherapy for the above-stated breast cancer.  Patient states she became progressively more short of breath over the last several days.  Upon evaluation in the ER was noted to have a large pleural effusion.  She has undergone thoracentesis removing 1.3 L of bloody fluid.  Patient is symptomatically improved and does not complain of any further shortness of breath.  She continues to have increased weakness and fatigue.  She has a poor appetite, but states this is improving as well.  She has no neurologic complaints.  She denies any recent fevers.  She has no chest pain, cough, or hemoptysis.  She denies any nausea, vomiting, constipation, or diarrhea.  She has no urinary complaints.  Patient offers no further specific complaints today.   REVIEW OF SYSTEMS:   Review of Systems  Constitutional: Positive for malaise/fatigue. Negative for fever and weight loss.  Respiratory: Positive for shortness of breath. Negative for cough and hemoptysis.   Cardiovascular: Negative.  Negative for chest pain and leg swelling.  Gastrointestinal: Negative.  Negative for abdominal pain.  Genitourinary: Negative.  Negative for dysuria.  Musculoskeletal: Negative.  Negative for back pain.  Skin: Negative.  Negative for rash.  Neurological: Negative.  Negative for dizziness, focal weakness, weakness and headaches.  Psychiatric/Behavioral: Negative.  The patient is not nervous/anxious.     As per HPI. Otherwise, a complete review of systems is negative.  PAST  MEDICAL HISTORY: Past Medical History:  Diagnosis Date  . Cataract   . Chronic constipation   . Chronic venous insufficiency   . COPD (chronic obstructive pulmonary disease) ([place])   . Essential hypertension, benign   . GERD (gastroesophageal reflux disease)   . Hypothyroidism   . Mood disorder ([place])    mostly anxiety  . Non-small cell lung cancer with metastasis ([place])   . Osteopenia   . Pancreatic mass   . Pancreatitis since 1961   chronic    PAST SURGICAL HISTORY: Past Surgical History:  Procedure Laterality Date  . APPENDECTOMY  1949  . CATARACT EXTRACTION Right 1987  . CATARACT EXTRACTION Left 1986  . detached retina repair Right 1991  . OOPHORECTOMY  1959   right ovary, part of left  . PORTA CATH INSERTION N/A 07/18/2017   Procedure: PORTA CATH INSERTION;  Surgeon: Algernon Huxley, MD;  Location: [facility];  Service: Cardiovascular;  Laterality: N/A;  . RETINAL DETACHMENT SURGERY Left 1997  . THORACOTOMY Left 2008   due to pneumonia  . uterine hysterectomy  1978    FAMILY HISTORY: Family History  Problem Relation Age of Onset  . Lung cancer Sister   . Breast cancer Neg Hx     ADVANCED DIRECTIVES (Y/N):  @ADVDIR @  HEALTH MAINTENANCE: Social History   Tobacco Use  . Smoking status: Former Smoker    Packs/day: 0.50    Years: 30.00    Pack years: 15.00    Types: Cigarettes    Last attempt to quit: 01/25/1993    Years since quitting: 24.6  . Smokeless tobacco: Never Used  Substance Use Topics  . Alcohol use: No  .  Drug use: No     Colonoscopy:  PAP:  Bone density:  Lipid panel:  Allergies  Allergen Reactions  . Amoxicillin     Has patient had a PCN reaction causing immediate rash, facial/tongue/throat swelling, SOB or lightheadedness with hypotension: Yes Has patient had a PCN reaction causing severe rash involving mucus membranes or skin necrosis: No Has patient had a PCN reaction that required hospitalization: Unknown Has patient had a  PCN reaction occurring within the last 10 years: Unknown If all of the above answers are "NO", then may proceed with Cephalosporin use.   . Codeine     vomiting  . Neosporin [Neomycin-Bacitracin Zn-Polymyx]   . Sulfa Antibiotics Rash    Current Facility-Administered Medications  Medication Dose Route Frequency Provider Last Rate Last Dose  . 0.9 %  sodium chloride infusion   Intravenous Continuous Gladstone Lighter, MD 75 mL/hr at 09/22/17 1723    . acetaminophen (TYLENOL) tablet 650 mg  650 mg Oral Q6H PRN Gladstone Lighter, MD       Or  . acetaminophen (TYLENOL) suppository 650 mg  650 mg Rectal Q6H PRN Gladstone Lighter, MD      . ALPRAZolam Duanne Moron) tablet 0.5 mg  0.5 mg Oral QID PRN Gouru, Aruna, MD   0.5 mg at 09/22/17 2144  . enoxaparin (LOVENOX) injection 40 mg  40 mg Subcutaneous Q24H Gladstone Lighter, MD   40 mg at 09/22/17 2144  . irbesartan (AVAPRO) tablet 150 mg  150 mg Oral Daily Gladstone Lighter, MD   150 mg at 09/22/17 0959  . levothyroxine (SYNTHROID, LEVOTHROID) tablet 50 mcg  50 mcg Oral QAC breakfast Gladstone Lighter, MD      . megestrol (MEGACE) tablet 40 mg  40 mg Oral Daily Gladstone Lighter, MD   40 mg at 09/22/17 1000  . meropenem (MERREM) 1 g in sodium chloride 0.9 % 100 mL IVPB  1 g Intravenous Q8H Paticia Stack, RPH 200 mL/hr at 09/22/17 2151 1 g at 09/22/17 2151  . ondansetron (ZOFRAN) tablet 4 mg  4 mg Oral Q6H PRN Gladstone Lighter, MD       Or  . ondansetron (ZOFRAN) injection 4 mg  4 mg Intravenous Q6H PRN Gladstone Lighter, MD      . pantoprazole (PROTONIX) EC tablet 40 mg  40 mg Oral Daily Gladstone Lighter, MD   40 mg at 09/22/17 0959  . PARoxetine (PAXIL) tablet 30 mg  30 mg Oral Daily Gladstone Lighter, MD   30 mg at 09/22/17 1000  . traMADol (ULTRAM) tablet 50 mg  50 mg Oral Q4H PRN Gouru, Aruna, MD   50 mg at 09/22/17 2144  . umeclidinium-vilanterol (ANORO ELLIPTA) 62.5-25 MCG/INH 1 puff  1 puff Inhalation Daily Gladstone Lighter,  MD   1 puff at 09/22/17 1000   Facility-Administered Medications Ordered in Other Encounters  Medication Dose Route Frequency Provider Last Rate Last Dose  . heparin lock flush 100 unit/mL  500 Units Intravenous Once Lloyd Huger, MD        OBJECTIVE: Vitals:   09/22/17 1938 09/22/17 2053  BP: (!) 198/83 (!) 149/68  Pulse: 90 81  Resp: 18   Temp: 97.6 F (36.4 C)   SpO2: 95%      Body mass index is 30.66 kg/m.    ECOG FS:2 - Symptomatic, <50% confined to bed  General: Well-developed, well-nourished, no acute distress. Eyes: Pink conjunctiva, anicteric sclera. HEENT: Normocephalic, moist mucous membranes, clear oropharnyx. Lungs: Clear to auscultation bilaterally. Heart: Regular  rate and rhythm. No rubs, murmurs, or gallops. Abdomen: Soft, nontender, nondistended. No organomegaly noted, normoactive bowel sounds. Musculoskeletal: No edema, cyanosis, or clubbing. Neuro: Alert, answering all questions appropriately. Cranial nerves grossly intact. Skin: No rashes or petechiae noted. Psych: Normal affect. Lymphatics: No cervical, calvicular, axillary or inguinal LAD.   LAB RESULTS:  Lab Results  Component Value Date   NA 124 (L) 09/22/2017   K 4.2 09/22/2017   CL 93 (L) 09/22/2017   CO2 20 (L) 09/22/2017   GLUCOSE 89 09/22/2017   BUN 15 09/22/2017   CREATININE 0.74 09/22/2017   CALCIUM 8.4 (L) 09/22/2017   PROT 7.0 09/22/2017   ALBUMIN 2.9 (L) 09/22/2017   AST 16 09/22/2017   ALT 11 09/22/2017   ALKPHOS 52 09/22/2017   BILITOT 0.6 09/22/2017   GFRNONAA >60 09/22/2017   GFRAA >60 09/22/2017    Lab Results  Component Value Date   WBC 9.4 09/22/2017   NEUTROABS 5.4 09/15/2017   HGB 10.0 (L) 09/22/2017   HCT 27.8 (L) 09/22/2017   MCV 83.1 09/22/2017   PLT 451 (H) 09/22/2017     STUDIES: Dg Chest 1 View  Result Date: 09/22/2017 CLINICAL DATA:  Status post right thoracentesis. EXAM: CHEST  1 VIEW COMPARISON:  09/22/2017 FINDINGS: The heart size and  mediastinal contours are within normal limits. After thoracentesis, there is significant reduction in pleural fluid volume and improved aeration of the right lung. A small amount of basilar pleural fluid remains. No pneumothorax is identified. There is no evidence of pulmonary edema, airspace consolidation or focal nodule. The visualized skeletal structures are unremarkable. IMPRESSION: Significant reduction in right pleural fluid volume after thoracentesis. No pneumothorax identified. Electronically Signed   By: Aletta Edouard M.D.   On: 09/22/2017 13:42   Dg Chest 2 View  Result Date: 09/22/2017 CLINICAL DATA:  Fall.  Chest pain.  Recent pneumonia.  Lung cancer. EXAM: CHEST - 2 VIEW COMPARISON:  07/25/2017. FINDINGS: PowerPort catheter with tip over superior vena cava. Heart size stable. No pulmonary venous congestion. Bilateral pulmonary infiltrates/edema, new from prior exam. Progressive bilateral pleural effusions. Atelectatic changes right upper lung. Right upper lung mass obscured by infiltrates and effusion. No acute bony abnormality. Old right rib fractures. IMPRESSION: 1. PowerPort catheter noted with tip over superior vena cava. 2. Bilateral pulmonary infiltrates/edema, new from prior exam. Progressive bilateral pleural effusions. Atelectatic changes right upper lung. Right upper lung mass obscured by infiltrates and effusion. Electronically Signed   By: Marcello Moores  Register   On: 09/22/2017 07:26   Ct Chest Wo Contrast  Result Date: 09/22/2017 CLINICAL DATA:  Dyspnea, history of non-small cell lung cancer. EXAM: CT CHEST WITHOUT CONTRAST TECHNIQUE: Multidetector CT imaging of the chest was performed following the standard protocol without IV contrast. COMPARISON:  Radiographs of September 22, 2017. CT scan of July 05, 2017. FINDINGS: Cardiovascular: Atherosclerosis of thoracic aorta is noted without aneurysm formation. Dissection cannot be excluded due the lack of intravenous contrast. Normal cardiac  size. No pericardial effusion. Coronary artery calcifications are noted. Right internal jugular catheter is noted with tip in SVC. Mediastinum/Nodes: Thyroid gland and esophagus are unremarkable. Precarinal lymph node measuring 9 mm is noted on image number 51. Stable subcarinal lymph node measuring 1 cm is noted on image number 64. Hilar adenopathy noted on prior exam is not well evaluated currently due to lack of intravenous contrast. Lungs/Pleura: Large right pleural effusion is noted which is increased in size compared to prior exam. No pneumothorax is noted. Right  upper lobe mass is again noted measuring 4.1 x 2.3 cm which is smaller compared to prior exam. There appears to be crescent-shaped scarring or fibrosis or atelectasis in right upper lobe. 12 x 6 mm subpleural nodule is noted posteriorly in the left lung apex which is not significantly changed compared to prior exam. Probable focal atelectasis or scarring is noted in lateral portion of lingular segment of left upper lobe. Upper Abdomen: Large complex mass containing calcifications involving the pancreatic head is again noted and not significantly changed compared to prior exam. It currently measures 8.6 x 8.3 cm. Musculoskeletal: No chest wall mass or suspicious bone lesions identified. IMPRESSION: Large right pleural effusion is noted which is increased in size compared to prior exam, with adjacent subsegmental atelectasis. 4.1 x 2.3 cm right upper lobe mass is noted which is smaller compared to prior exam. There is interval development of atelectasis, scarring or fibrosis in the right upper lobe. Stable subcarinal and precarinal adenopathy is noted compared to prior exam. Hilar adenopathy noted on prior exam is not well visualized currently due to lack of intravenous contrast. Stable 12 x 6 mm subpleural nodule is noted posteriorly in the left lung apex. Stable large complex mass with calcifications is noted in the pancreatic head. Aortic  Atherosclerosis (ICD10-I70.0) and Emphysema (ICD10-J43.9). Electronically Signed   By: Marijo Conception, M.D.   On: 09/22/2017 08:32   US Thoracentesis Asp Pleural Space W/img Guide  Result Date: 09/22/2017 CLINICAL DATA:  Right pleural effusion and history of lung carcinoma. EXAM: ULTRASOUND GUIDED RIGHT THORACENTESIS COMPARISON:  CT of the chest on 09/22/2017 PROCEDURE: An ultrasound guided thoracentesis was thoroughly discussed with the patient and questions answered. The benefits, risks, alternatives and complications were also discussed. The patient understands and wishes to proceed with the procedure. Written consent was obtained. A time-out was performed prior to initiating the procedure. Ultrasound was performed to localize and mark an adequate pocket of fluid in the right chest. The area was then prepped and draped in the normal sterile fashion. 1% Lidocaine was used for local anesthesia. Under ultrasound guidance a 6 French Safe-T-Centesis catheter was introduced. Thoracentesis was performed. The catheter was removed and a dressing applied. COMPLICATIONS: None FINDINGS: A total of approximately 1.3 L of blood tinged fluid was removed. A fluid sample was sent for laboratory analysis. IMPRESSION: Successful ultrasound guided right thoracentesis yielding 1.3 L of pleural fluid. Electronically Signed   By: Aletta Edouard M.D.   On: 09/22/2017 13:41    ASSESSMENT: Squamous cell carcinoma of the lung, shortness of breath secondary to pleural effusion.  PLAN:    1.  Squamous cell carcinoma of the lung: Although patient is clinically stage II-III yea, it is possible that this is a malignant pleural effusion increasing her to stage IV.  Patient completed concurrent chemotherapy and XRT on August 12, 2017.  Since that time she has been receiving immunotherapy with Imfinzi every 2 weeks.  Her most recent infusion was delayed secondary to pneumonia.  She has a PET scan scheduled for next week with follow-up 1  to 2 days later.  She has been instructed to keep these appointments as scheduled for further evaluation and consideration of continuation of treatment. 2.  Pleural effusion: Thoracentesis revealed 1.3 L of blood-tinged fluid.  Possibly malignant.  Patient is symptomatically improved.  She does not require Pleurx catheter at this time, but this may be consideration in the future if fluid quickly re-accumulates.  No further intervention is needed.  Keep follow-up as above. 3.  Pneumonia: Agree with current antibiotics. 4.  Weakness and fatigue: Physical therapy evaluation pending. 5.  Disposition: Okay from oncology standpoint for discharge.  Patient has been instructed to keep her follow-up next week as above.  Appreciate consult, call with questions.   Cancer Staging Squamous cell carcinoma lung, right ([place]) Staging form: Lung, AJCC 8th Edition - Clinical stage from 07/18/2017: Stage IIIA (cT2b, cN2, cM0) - Signed by Lloyd Huger, MD on 07/18/2017   Lloyd Huger, MD   09/22/2017 10:42 PM

## 2017-09-22 NOTE — Progress Notes (Signed)
   [place] at [facility] Day: 0 days Nicole Arnold is a 84 y.o. female presenting with Respiratory Distress and Fall .   Advance care planning discussed with patient and her daughter at bedside. All questions in regards to overall condition and expected prognosis answered. Patient understands her diagnosis and makes it clear that she would want all the care until her heart stops and at that point- does not want to be resuscitated. The decision was made to change her code status to DNR  CODE STATUS: DNR Time spent: 18 minutes

## 2017-09-22 NOTE — H&P (Addendum)
[facility] NAME: Nicole Arnold    MR#:  [id number]  DATE OF BIRTH:  Aug 14, 1928  DATE OF ADMISSION:  09/22/2017  PRIMARY CARE PHYSICIAN: Venia Carbon, MD   REQUESTING/REFERRING PHYSICIAN: Dr. Marjean Donna  CHIEF COMPLAINT:   Chief Complaint  Patient presents with  . Respiratory Distress  . Fall    HISTORY OF PRESENT ILLNESS:  Nicole Arnold  is a 84 y.o. female with a known history of non-small cell lung cancer diagnosed in June 2019 status post chemoradiation and currently on immunotherapy, chronic pancreatitis, hypertension and hypothyroidism presents to hospital secondary to worsening shortness of breath since yesterday. Patient had a accidental fall while she was sitting on the commode couple of days ago, denies hitting head or losing consciousness.  She was being treated for pneumonia with Levaquin as an outpatient for the past week.  She has chronic cough since the diagnosis of her lung cancer.  Has been complaining of left-sided chest pain with movement and deep breaths for the last couple of days.  Her breathing has become more worse since yesterday.  This morning she felt like she was drowning, could not breathe and so presented to the emergency room.  Chest x-ray and CT chest done showing worsening right pleural effusion and is being admitted for the same.  She is currently on room air.  Denies any fevers or chills.  PAST MEDICAL HISTORY:   Past Medical History:  Diagnosis Date  . Cataract   . Chronic constipation   . Chronic venous insufficiency   . COPD (chronic obstructive pulmonary disease) ([place])   . Essential hypertension, benign   . GERD (gastroesophageal reflux disease)   . Hypothyroidism   . Mood disorder ([place])    mostly anxiety  . Non-small cell lung cancer with metastasis ([place])   . Osteopenia   . Pancreatic mass   . Pancreatitis since 1961   chronic    PAST SURGICAL HISTORY:   Past  Surgical History:  Procedure Laterality Date  . APPENDECTOMY  1949  . CATARACT EXTRACTION Right 1987  . CATARACT EXTRACTION Left 1986  . detached retina repair Right 1991  . OOPHORECTOMY  1959   right ovary, part of left  . PORTA CATH INSERTION N/A 07/18/2017   Procedure: PORTA CATH INSERTION;  Surgeon: Algernon Huxley, MD;  Location: [facility];  Service: Cardiovascular;  Laterality: N/A;  . RETINAL DETACHMENT SURGERY Left 1997  . THORACOTOMY Left 2008   due to pneumonia  . uterine hysterectomy  1978    SOCIAL HISTORY:   Social History   Tobacco Use  . Smoking status: Former Smoker    Packs/day: 0.50    Years: 30.00    Pack years: 15.00    Types: Cigarettes    Last attempt to quit: 01/25/1993    Years since quitting: 24.6  . Smokeless tobacco: Never Used  Substance Use Topics  . Alcohol use: No    FAMILY HISTORY:   Family History  Problem Relation Age of Onset  . Lung cancer Sister   . Breast cancer Neg Hx     DRUG ALLERGIES:   Allergies  Allergen Reactions  . Amoxicillin     Has patient had a PCN reaction causing immediate rash, facial/tongue/throat swelling, SOB or lightheadedness with hypotension: Yes Has patient had a PCN reaction causing severe rash involving mucus membranes or skin necrosis: No Has patient had a PCN reaction that required  hospitalization: Unknown Has patient had a PCN reaction occurring within the last 10 years: Unknown If all of the above answers are "NO", then may proceed with Cephalosporin use.   . Codeine     vomiting  . Neosporin [Neomycin-Bacitracin Zn-Polymyx]   . Sulfa Antibiotics Rash    REVIEW OF SYSTEMS:   Review of Systems  Constitutional: Positive for malaise/fatigue. Negative for chills, fever and weight loss.  HENT: Negative for ear discharge, ear pain, hearing loss and nosebleeds.   Eyes: Negative for blurred vision, double vision and photophobia.  Respiratory: Positive for cough and shortness of breath.  Negative for hemoptysis and wheezing.   Cardiovascular: Negative for chest pain, palpitations, orthopnea and leg swelling.  Gastrointestinal: Positive for abdominal pain. Negative for constipation, diarrhea, heartburn, melena, nausea and vomiting.  Genitourinary: Negative for dysuria, frequency, hematuria and urgency.  Musculoskeletal: Positive for falls and myalgias. Negative for back pain and neck pain.  Skin: Negative for rash.  Neurological: Negative for dizziness, tingling, tremors, sensory change, speech change, focal weakness and headaches.  Endo/Heme/Allergies: Does not bruise/bleed easily.  Psychiatric/Behavioral: Negative for depression.    MEDICATIONS AT HOME:   Prior to Admission medications   Medication Sig Start Date End Date Taking? Authorizing Provider  acetaminophen (TYLENOL) 325 MG tablet Take 650 mg by mouth every 4 (four) hours as needed for mild pain or fever.    Yes [provider]  ALPRAZolam Duanne Moron) 0.5 MG tablet Take 0.5 mg by mouth 3 (three) times daily.    Yes [provider]  alum hydroxide-mag trisilicate (GAVISCON) 78-46 MG CHEW chewable tablet Chew 2 tablets by mouth 3 (three) times daily as needed for indigestion or heartburn.   Yes [provider]  feeding supplement (BOOST HIGH PROTEIN) LIQD Take 1 Container by mouth 2 (two) times daily between meals.   Yes [provider]  lansoprazole (PREVACID) 30 MG capsule Take 30 mg by mouth daily at 12 noon.   Yes [provider]  levothyroxine (SYNTHROID, LEVOTHROID) 50 MCG tablet Take 50 mcg by mouth daily before breakfast.   Yes [provider]  lidocaine-prilocaine (EMLA) cream Apply 1 application topically as needed.   Yes [provider]  loperamide (IMODIUM) 2 MG capsule Take 2 capsules (4MG ) by mouth and 1 capsule (2MG ) after each subsequent loose stool - max 8 capsules daily   Yes [provider]  megestrol (MEGACE) 40 MG tablet Take 1  tablet (40 mg total) by mouth daily. 07/14/17  Yes Lloyd Huger, MD  MICARDIS 40 MG tablet Take 40 mg by mouth daily. 07/04/17  Yes [provider]  ondansetron (ZOFRAN) 8 MG tablet Take 8 mg by mouth 2 (two) times daily as needed for nausea or vomiting.   Yes [provider]  PARoxetine (PAXIL) 30 MG tablet Take 30 mg by mouth daily.    Yes [provider]  traMADol (ULTRAM) 50 MG tablet Take 50 mg by mouth every 4 (four) hours as needed for moderate pain.    Yes [provider]  umeclidinium-vilanterol (ANORO ELLIPTA) 62.5-25 MCG/INH AEPB Inhale 1 puff daily into the lungs. 12/02/16  Yes Juanito Doom, MD  prochlorperazine (COMPAZINE) 10 MG tablet Take 1 tablet (10 mg total) by mouth every 6 (six) hours as needed (Nausea or vomiting). 07/19/17 08/15/17  Lloyd Huger, MD      VITAL SIGNS:  Blood pressure (!) 157/72, pulse 89, temperature (!) 97.5 F (36.4 C), temperature source Oral, resp. rate 18,  height 5\' 3"  (1.6 m), weight 79.8 kg, SpO2 91 %.  PHYSICAL EXAMINATION:   Physical Exam  GENERAL:  84 y.o.-year-old elderly patient lying in the bed with no acute distress.  EYES: Pupils equal, round, reactive to light and accommodation. No scleral icterus. Extraocular muscles intact.  HEENT: Head atraumatic, normocephalic. Oropharynx and nasopharynx clear.  NECK:  Supple, no jugular venous distention. No thyroid enlargement, no tenderness.  LUNGS: Normal breath sounds bilaterally, no wheezing, rales,rhonchi or crepitation. Decreased right basilar breath sounds No use of accessory muscles of respiration.  CARDIOVASCULAR: S1, S2 normal. No  rubs, or gallops. 2/6 systolic murmur present ABDOMEN: Soft, nontender, nondistended. Bowel sounds present. No organomegaly or mass.  EXTREMITIES: No pedal edema, cyanosis, or clubbing.  NEUROLOGIC: Cranial nerves II through XII are intact. Muscle strength 5/5 in all extremities. Sensation intact. Gait not  checked.  PSYCHIATRIC: The patient is alert and oriented x 3.  SKIN: No obvious rash, lesion, or ulcer.   LABORATORY PANEL:   CBC Recent Labs  Lab 09/22/17 0621  WBC 9.4  HGB 10.0*  HCT 27.8*  PLT 451*   ------------------------------------------------------------------------------------------------------------------  Chemistries  Recent Labs  Lab 09/22/17 0621  NA 124*  K 4.2  CL 93*  CO2 20*  GLUCOSE 89  BUN 15  CREATININE 0.74  CALCIUM 8.4*  AST 16  ALT 11  ALKPHOS 52  BILITOT 0.6   ------------------------------------------------------------------------------------------------------------------  Cardiac Enzymes Recent Labs  Lab 09/22/17 0621  TROPONINI <0.03   ------------------------------------------------------------------------------------------------------------------  RADIOLOGY:  Dg Chest 2 View  Result Date: 09/22/2017 CLINICAL DATA:  Fall.  Chest pain.  Recent pneumonia.  Lung cancer. EXAM: CHEST - 2 VIEW COMPARISON:  07/25/2017. FINDINGS: PowerPort catheter with tip over superior vena cava. Heart size stable. No pulmonary venous congestion. Bilateral pulmonary infiltrates/edema, new from prior exam. Progressive bilateral pleural effusions. Atelectatic changes right upper lung. Right upper lung mass obscured by infiltrates and effusion. No acute bony abnormality. Old right rib fractures. IMPRESSION: 1. PowerPort catheter noted with tip over superior vena cava. 2. Bilateral pulmonary infiltrates/edema, new from prior exam. Progressive bilateral pleural effusions. Atelectatic changes right upper lung. Right upper lung mass obscured by infiltrates and effusion. Electronically Signed   By: Marcello Moores  Register   On: 09/22/2017 07:26   Ct Chest Wo Contrast  Result Date: 09/22/2017 CLINICAL DATA:  Dyspnea, history of non-small cell lung cancer. EXAM: CT CHEST WITHOUT CONTRAST TECHNIQUE: Multidetector CT imaging of the chest was performed following the standard  protocol without IV contrast. COMPARISON:  Radiographs of September 22, 2017. CT scan of July 05, 2017. FINDINGS: Cardiovascular: Atherosclerosis of thoracic aorta is noted without aneurysm formation. Dissection cannot be excluded due the lack of intravenous contrast. Normal cardiac size. No pericardial effusion. Coronary artery calcifications are noted. Right internal jugular catheter is noted with tip in SVC. Mediastinum/Nodes: Thyroid gland and esophagus are unremarkable. Precarinal lymph node measuring 9 mm is noted on image number 51. Stable subcarinal lymph node measuring 1 cm is noted on image number 64. Hilar adenopathy noted on prior exam is not well evaluated currently due to lack of intravenous contrast. Lungs/Pleura: Large right pleural effusion is noted which is increased in size compared to prior exam. No pneumothorax is noted. Right upper lobe mass is again noted measuring 4.1 x 2.3 cm which is smaller compared to prior exam. There appears to be crescent-shaped scarring or fibrosis or atelectasis in right upper lobe. 12 x 6 mm subpleural nodule is noted posteriorly in the left lung  apex which is not significantly changed compared to prior exam. Probable focal atelectasis or scarring is noted in lateral portion of lingular segment of left upper lobe. Upper Abdomen: Large complex mass containing calcifications involving the pancreatic head is again noted and not significantly changed compared to prior exam. It currently measures 8.6 x 8.3 cm. Musculoskeletal: No chest wall mass or suspicious bone lesions identified. IMPRESSION: Large right pleural effusion is noted which is increased in size compared to prior exam, with adjacent subsegmental atelectasis. 4.1 x 2.3 cm right upper lobe mass is noted which is smaller compared to prior exam. There is interval development of atelectasis, scarring or fibrosis in the right upper lobe. Stable subcarinal and precarinal adenopathy is noted compared to prior exam.  Hilar adenopathy noted on prior exam is not well visualized currently due to lack of intravenous contrast. Stable 12 x 6 mm subpleural nodule is noted posteriorly in the left lung apex. Stable large complex mass with calcifications is noted in the pancreatic head. Aortic Atherosclerosis (ICD10-I70.0) and Emphysema (ICD10-J43.9). Electronically Signed   By: Marijo Conception, M.D.   On: 09/22/2017 08:32    EKG:   Orders placed or performed in visit on 09/22/17  . EKG 12-Lead  . EKG 12-Lead  . EKG 12-Lead    IMPRESSION AND PLAN:   Nicole Arnold  is a 84 y.o. female with a known history of non-small cell lung cancer diagnosed in June 2019 status post chemoradiation and currently on immunotherapy, chronic pancreatitis, hypertension and hypothyroidism presents to hospital secondary to worsening shortness of breath since yesterday.  1.  Acute respiratory distress-on presentation, currently not hypoxic and on room air -Secondary to worsening right pleural effusion.  Patient had thoracentesis done twice since her diagnosis of lung cancer in June 2019.  CT chest confirming worsening effusion now. -Ultrasound-guided thoracentesis requested for. -CT chest showing some right base pneumonia versus atelectasis.  Being covered with vancomycin and meropenem.  Check MRSA PCR. -Supplemental oxygen as needed. -Right lung mass appears smaller than her previous CT.  2.  Hyponatremia-likely hypovolemic hyponatremia.  Also has lung cancer which can cause hyponatremia. -DC Lasix and give gentle fluids and monitor  3.  Stage III squamous cell carcinoma of right lung-status post radiation and chemotherapy and currently on immunotherapy every 2 weeks. -Oncology consulted.  CT of the chest showing shrinking in the mass size.  But recurrent pleural effusions.  4.  Thyroidism-continue Synthroid  5.  GERD-on PPI  6.  DVT prophylaxis-we will start Lovenox after the tap today  Physical therapy consult    All  the records are reviewed and case discussed with ED provider. Management plans discussed with the patient, family and they are in agreement.  CODE STATUS: DNR  TOTAL TIME TAKING CARE OF THIS PATIENT: 50 minutes.    Gladstone Lighter M.D on 09/22/2017 at 10:50 AM  Between 7am to 6pm - Pager - [phone]  After 6pm go to [email or web address] - password [email or web address]  Office  [phone]  CC: Primary care physician; Venia Carbon, MD

## 2017-09-22 NOTE — ED Provider Notes (Signed)
[facility] Emergency Department Provider Note   None    (approximate)  I have reviewed the triage vital signs and the nursing notes.   HISTORY  Chief Complaint Respiratory Distress and Fall   HPI Nicole Arnold is a 84 y.o. female below list of chronic medical conditions including non-small cell lung cancer with metastases presenting to the emergency department via [facility] with dyspnea left-sided chest discomfort.  Patient states that she had a fall on Monday with resultant left lateral chest discomfort however patient states that pain is progressively worsened and dyspnea likewise is worsened. In addition the patient admits to   Past Medical History:  Diagnosis Date  . Cataract   . Chronic constipation   . Chronic venous insufficiency   . COPD (chronic obstructive pulmonary disease) ([place])   . Essential hypertension, benign   . GERD (gastroesophageal reflux disease)   . Hypothyroidism   . Mood disorder ([place])    mostly anxiety  . Non-small cell lung cancer with metastasis ([place])   . Osteopenia   . Pancreatic mass   . Pancreatitis since 1961   chronic    Patient Active Problem List   Diagnosis Date Noted  . Non-small cell lung cancer with metastasis ([place])   . Pancreatic mass   . Hypothyroidism   . GERD (gastroesophageal reflux disease)   . COPD (chronic obstructive pulmonary disease) ([place])   . Mood disorder ([place])   . Essential hypertension, benign   . Squamous cell carcinoma lung, right ([place]) 07/11/2017  . SOB (shortness of breath) 07/05/2017  . Lymphedema 04/03/2016  . Chronic venous insufficiency 04/03/2016  . Pulmonary nodules 07/07/2015  . Pain of finger of left hand 01/07/2014  . OSA (obstructive sleep apnea) 09/07/2013  . Obesity, unspecified 09/07/2013  . Dyspnea 07/24/2013  . Emphysema lung ([place]) 07/24/2013  . Pulmonary hypertension ([place]) 07/24/2013  . Fatigue 07/24/2013    Past Surgical  History:  Procedure Laterality Date  . APPENDECTOMY  1949  . CATARACT EXTRACTION Right 1987  . CATARACT EXTRACTION Left 1986  . detached retina repair Right 1991  . OOPHORECTOMY  1959   right ovary, part of left  . PORTA CATH INSERTION N/A 07/18/2017   Procedure: PORTA CATH INSERTION;  Surgeon: Algernon Huxley, MD;  Location: [facility];  Service: Cardiovascular;  Laterality: N/A;  . RETINAL DETACHMENT SURGERY Left 1997  . THORACOTOMY Left 2008   due to pneumonia  . uterine hysterectomy  1978    Prior to Admission medications   Medication Sig Start Date End Date Taking? Authorizing Provider  acetaminophen (TYLENOL) 325 MG tablet Take 650 mg by mouth every 4 (four) hours as needed.    [provider]  ALPRAZolam Duanne Moron) 0.5 MG tablet Take 0.5 mg by mouth 3 (three) times daily as needed for anxiety.    [provider]  furosemide (LASIX) 40 MG tablet Take 40 mg by mouth.    [provider]  lansoprazole (PREVACID) 30 MG capsule Take 30 mg by mouth daily at 12 noon.    [provider]  levofloxacin (LEVAQUIN) 250 MG tablet Take 250 mg by mouth daily.    [provider]  levofloxacin (LEVAQUIN) 500 MG tablet Take 500 mg by mouth daily.    [provider]  levothyroxine (SYNTHROID, LEVOTHROID) 50 MCG tablet Take 50 mcg by mouth daily before breakfast.    [provider]  lidocaine-prilocaine (EMLA) cream Apply 1 application  topically as needed.    [provider]  megestrol (MEGACE) 40 MG tablet Take 1 tablet (40 mg total) by mouth daily. 07/14/17   Lloyd Huger, MD  MICARDIS 40 MG tablet Take 40 mg by mouth daily. 07/04/17   [provider]  ondansetron (ZOFRAN) 8 MG tablet Take 8 mg by mouth 2 (two) times daily as needed for nausea or vomiting.    [provider]  PARoxetine (PAXIL) 30 MG tablet Take 30 mg by mouth daily.     [provider]  traMADol (ULTRAM) 50 MG tablet Take 50  mg by mouth every 4 (four) hours as needed.    [provider]  umeclidinium-vilanterol (ANORO ELLIPTA) 62.5-25 MCG/INH AEPB Inhale 1 puff daily into the lungs. 12/02/16   Juanito Doom, MD  prochlorperazine (COMPAZINE) 10 MG tablet Take 1 tablet (10 mg total) by mouth every 6 (six) hours as needed (Nausea or vomiting). 07/19/17 08/15/17  Lloyd Huger, MD    Allergies Amoxicillin; Codeine; Neosporin [neomycin-bacitracin zn-polymyx]; and Sulfa antibiotics  Family History  Problem Relation Age of Onset  . Breast cancer Neg Hx     Social History Social History   Tobacco Use  . Smoking status: Former Smoker    Packs/day: 0.50    Years: 30.00    Pack years: 15.00    Types: Cigarettes    Last attempt to quit: 01/25/1993    Years since quitting: 24.6  . Smokeless tobacco: Never Used  Substance Use Topics  . Alcohol use: No  . Drug use: No    Review of Systems Constitutional: No fever/chills Eyes: No visual changes. ENT: No sore throat. Cardiovascular: Positive for left lateral chest pain Respiratory: Positive for dyspnea shortness of breath. Gastrointestinal: No abdominal pain.  No nausea, no vomiting.  No diarrhea.  No constipation. Genitourinary: Negative for dysuria. Musculoskeletal: Negative for neck pain.  Negative for back pain. Integumentary: Negative for rash. Neurological: Negative for headaches, focal weakness or numbness.   ____________________________________________   PHYSICAL EXAM:  VITAL SIGNS: ED Triage Vitals  Enc Vitals Group     BP      Pulse      Resp      Temp      Temp src      SpO2      Weight      Height      Head Circumference      Peak Flow      Pain Score      Pain Loc      Pain Edu?      Excl. in [place]?     Constitutional: Alert and oriented.  Apparent discomfort and respiratory difficulty  eyes: Conjunctivae are normal.  Head: Atraumatic. Mouth/Throat: Mucous membranes are moist. Oropharynx  non-erythematous. Neck: No stridor.   Cardiovascular: Normal rate, regular rhythm. Good peripheral circulation. Grossly normal heart sounds. Respiratory: Tachypnea, positive accessory respiratory muscle use, diffuse rales bilaterally. Gastrointestinal: Soft and nontender.  Abdominal distention with ascites fluid wave.   Musculoskeletal: No lower extremity tenderness.  1+ bilateral lower extremity pitting edema.. Neurologic:  Normal speech and language. No gross focal neurologic deficits are appreciated.  Skin:  Skin is warm, dry and intact. No rash noted. Psychiatric: Mood and affect are normal. Speech and behavior are normal.  ____________________________________________   LABS (all labs ordered are listed, but only abnormal results are displayed)  Labs Reviewed  CBC - Abnormal; Notable for the following components:  Result Value   RBC 3.35 (*)    Hemoglobin 10.0 (*)    HCT 27.8 (*)    RDW 22.4 (*)    Platelets 451 (*)    All other components within normal limits  COMPREHENSIVE METABOLIC PANEL - Abnormal; Notable for the following components:   Sodium 124 (*)    Chloride 93 (*)    CO2 20 (*)    Calcium 8.4 (*)    Albumin 2.9 (*)    All other components within normal limits  TROPONIN I  BRAIN NATRIURETIC PEPTIDE  LACTIC ACID, PLASMA   ____________________________________________  EKG  ED ECG REPORT I, [place] N BROWN, the attending physician, personally viewed and interpreted this ECG.   Date: 09/22/2017  EKG Time: 6:19 AM  Rate: 87  Rhythm: Normal sinus rhythm  Axis: Normal  Intervals: Normal  ST&T Change: None  ____________________________________________  RADIOLOGY  I, [place] N BROWN, personally viewed and evaluated these images (plain radiographs) as part of my medical decision making, as well as reviewing the written report by the radiologist.  ED MD interpretation: Diffuse bilateral infiltrates/edema  Official radiology report(s): Dg Chest 2  View  Result Date: 09/22/2017 CLINICAL DATA:  Fall.  Chest pain.  Recent pneumonia.  Lung cancer. EXAM: CHEST - 2 VIEW COMPARISON:  07/25/2017. FINDINGS: PowerPort catheter with tip over superior vena cava. Heart size stable. No pulmonary venous congestion. Bilateral pulmonary infiltrates/edema, new from prior exam. Progressive bilateral pleural effusions. Atelectatic changes right upper lung. Right upper lung mass obscured by infiltrates and effusion. No acute bony abnormality. Old right rib fractures. IMPRESSION: 1. PowerPort catheter noted with tip over superior vena cava. 2. Bilateral pulmonary infiltrates/edema, new from prior exam. Progressive bilateral pleural effusions. Atelectatic changes right upper lung. Right upper lung mass obscured by infiltrates and effusion. Electronically Signed   By: Marcello Moores  Register   On: 09/22/2017 07:26      .Critical Care Performed by: Gregor Hams, MD Authorized by: Gregor Hams, MD   Critical care provider statement:    Critical care time (minutes):  30   Critical care time was exclusive of:  Separately billable procedures and treating other patients   Critical care was necessary to treat or prevent imminent or life-threatening deterioration of the following conditions:  Sepsis and respiratory failure   Critical care was time spent personally by me on the following activities:  Development of treatment plan with patient or surrogate, discussions with consultants, evaluation of patient's response to treatment, examination of patient, obtaining history from patient or surrogate, ordering and performing treatments and interventions, ordering and review of laboratory studies, ordering and review of radiographic studies, pulse oximetry, re-evaluation of patient's condition and review of old charts   I assumed direction of critical care for this patient from another provider in my specialty: no        ____________________________________________   INITIAL IMPRESSION / ASSESSMENT AND PLAN / ED COURSE  As part of my medical decision making, I reviewed the following data within the electronic MEDICAL RECORD NUMBER   84 year old female presenting with above-stated history and physical exam secondary to dyspnea.  Chest x-ray consistent with pneumonia versus pulmonary edema possible malignant effusion.  Given IV antibiotics given concern for possible pneumonia.  Patient discussed with Dr. Tressia Miners ____________________________________________  FINAL CLINICAL IMPRESSION(S) / ED DIAGNOSES  Final diagnoses:  Pleural effusion  Community acquired pneumonia, unspecified laterality     MEDICATIONS GIVEN DURING THIS VISIT:  Medications  vancomycin (VANCOCIN) IVPB 1000 mg/200  mL premix (has no administration in time range)  levofloxacin (LEVAQUIN) IVPB 500 mg (has no administration in time range)  ipratropium-albuterol (DUONEB) 0.5-2.5 (3) MG/3ML nebulizer solution 3 mL (has no administration in time range)  morphine 2 MG/ML injection 2 mg (2 mg Intravenous Given 09/22/17 0643)  ondansetron (ZOFRAN) injection 4 mg (4 mg Intravenous Given 09/22/17 0813)     ED Discharge Orders    None       Note:  This document was prepared using Dragon voice recognition software and may include unintentional dictation errors.    Gregor Hams, MD 09/22/17 [phone]

## 2017-09-22 NOTE — Clinical Social Work Note (Signed)
Clinical Social Work Assessment  Patient Details  Name: Nicole Arnold MRN: [id number] Date of Birth: 1929/01/04  Date of referral:  09/22/17               Reason for consult:  Facility Placement                Permission sought to share information with:  Case Manager, Customer service manager, Family Supports Permission granted to share information::  Yes, Verbal Permission Granted  Name::        Agency::     Relationship::     Contact Information:     Housing/Transportation Living arrangements for the past 2 months:  [place] of Information:  Patient Patient Interpreter Needed:  None Criminal Activity/Legal Involvement Pertinent to Current Situation/Hospitalization:  No - Comment as needed Significant Relationships:  Adult Children, Spouse, Friend Lives with:  Facility Resident Do you feel safe going back to the place where you live?  Yes Need for family participation in patient care:  Yes (Comment)  Care giving concerns:  Patient is a long term resident at [facility]    Social Worker assessment / plan:  CSW found in chart review that patient is from [facility]. CSW met with patient to discuss discharge plan. CSW introduced self and explained role. Patient states that she has been a resident at [facility] for about 9 years and lives with her husband there. Patient states that her husband fell a while back and had to move to SNF. Per patient she moved to SNF around May of 2019 to SNF as well due to pneumonia. CSW spoke with Seth Bake at [facility] and she confirmed that patient is from SNF at [facility]. CSW will continue to follow for discharge planning.   Employment status:  Retired Forensic scientist:  Medicare PT Recommendations:  Not assessed at this time Information / Referral to community resources:     Patient/Family's Response to care:  Patient thanked CSW for assistance   Patient/Family's Understanding of and Emotional Response to  Diagnosis, Current Treatment, and Prognosis:  Patient is in agreement with discharge plan   Emotional Assessment Appearance:  Appears stated age Attitude/Demeanor/Rapport:    Affect (typically observed):  Accepting, Pleasant, Hopeful Orientation:  Oriented to Self, Oriented to Place, Oriented to  Time Alcohol / Substance use:  Not Applicable Psych involvement (Current and /or in the community):  No (Comment)  Discharge Needs  Concerns to be addressed:  Discharge Planning Concerns Readmission within the last 30 days:  No Current discharge risk:  None Barriers to Discharge:  Continued Medical Work up   [employer], [place] 09/22/2017, 10:30 AM

## 2017-09-22 NOTE — ED Notes (Signed)
Patient is conversing with visitor.  Initially sat 89 on room air, but as she is talking to visitor with difficulty in full sentences her sat is up to 93%.

## 2017-09-22 NOTE — ED Notes (Signed)
Up to toilet to void.  willt ake to floor.

## 2017-09-22 NOTE — Procedures (Signed)
Interventional Radiology Procedure Note  Procedure: US guided right thoracentesis  Complications: None  Estimated Blood Loss: None  Findings: 1.3 L blood-tinged fluid removed from right pleural space.  Venetia Night. Kathlene Cote, M.D Pager:  [phone]

## 2017-09-22 NOTE — Progress Notes (Signed)
PT Cancellation Note  Patient Details Name: Nicole Arnold MRN: [id number] DOB: 1928/06/19   Cancelled Treatment:    Reason Eval/Treat Not Completed: Patient declined, no reason specified PT chart reviewed and attempted EVAL, but pt refuses to "do rehab" stating she is "too tired" and pain 9/10 NPS with movement. PT educated on the importance of activity, and pt still declines. Pt states she "will work with PT tomorrow". PT will reattempt tomorrow.  Rosario Adie, SPT     Rosario Adie 09/22/2017, 1:48 PM

## 2017-09-22 NOTE — ED Triage Notes (Signed)
Patient coming in via [facility] from [facility]. Patient had a fall on Monday and now has left sided pain and did recently have pneumonia and just finished antibiotics. Also has right sided Lung Cancer and going through treatment. BP was elevated for EMS at 200/94.

## 2017-09-22 NOTE — NC FL2 (Signed)
[place] LEVEL OF CARE SCREENING TOOL     IDENTIFICATION  Patient Name: Nicole Arnold Birthdate: [birth year] Sex: female Admission Date (Current Location): 09/22/2017  [place] and [place] Number:  Engineering geologist and Address:  [facility], [address], [place], [place] [address]      Provider Number: [id number]  Attending Physician Name and Address:  Gladstone Lighter, MD  Relative Name and Phone Number:  Lequita Asal- Daughter [phone]    Current Level of Care: Hospital Recommended Level of Care: [place] Prior Approval Number:    Date Approved/Denied:   PASRR Number:    Discharge Plan: SNF    Current Diagnoses: Patient Active Problem List   Diagnosis Date Noted  . Pneumonia 09/22/2017  . Non-small cell lung cancer with metastasis ([place])   . Pancreatic mass   . Hypothyroidism   . GERD (gastroesophageal reflux disease)   . COPD (chronic obstructive pulmonary disease) ([place])   . Mood disorder ([place])   . Essential hypertension, benign   . Squamous cell carcinoma lung, right ([place]) 07/11/2017  . SOB (shortness of breath) 07/05/2017  . Lymphedema 04/03/2016  . Chronic venous insufficiency 04/03/2016  . Pulmonary nodules 07/07/2015  . Pain of finger of left hand 01/07/2014  . OSA (obstructive sleep apnea) 09/07/2013  . Obesity, unspecified 09/07/2013  . Dyspnea 07/24/2013  . Emphysema lung ([place]) 07/24/2013  . Pulmonary hypertension ([place]) 07/24/2013  . Fatigue 07/24/2013    Orientation RESPIRATION BLADDER Height & Weight     Self, Time, Place  Normal Continent Weight: 175 lb 14.4 oz (79.8 kg) Height:  5\' 3"  (160 cm)  BEHAVIORAL SYMPTOMS/MOOD NEUROLOGICAL BOWEL NUTRITION STATUS  (none) (None) Continent Diet(Heart Healthy )  AMBULATORY STATUS COMMUNICATION OF NEEDS Skin   Extensive Assist Verbally Normal                       Personal Care Assistance Level of Assistance  Bathing,  Feeding, Dressing Bathing Assistance: Limited assistance Feeding assistance: Independent Dressing Assistance: Limited assistance     Functional Limitations Info  Sight, Hearing, Speech Sight Info: Adequate Hearing Info: Adequate Speech Info: Adequate    SPECIAL CARE FACTORS FREQUENCY  PT (By licensed PT), OT (By licensed OT)                    Contractures Contractures Info: Not present    Additional Factors Info  Code Status, Allergies Code Status Info: Full Code  Allergies Info: Amoxicillin, Codeine, Neosporin Neomycin-bacitracin Zn-polymyx, Sulfa Antibiotics           Current Medications (09/22/2017):  This is the current hospital active medication list Current Facility-Administered Medications  Medication Dose Route Frequency Provider Last Rate Last Dose  . 0.9 %  sodium chloride infusion   Intravenous Continuous Gladstone Lighter, MD      . acetaminophen (TYLENOL) tablet 650 mg  650 mg Oral Q6H PRN Gladstone Lighter, MD       Or  . acetaminophen (TYLENOL) suppository 650 mg  650 mg Rectal Q6H PRN Gladstone Lighter, MD      . ALPRAZolam Duanne Moron) tablet 0.5 mg  0.5 mg Oral TID PRN Gladstone Lighter, MD      . irbesartan (AVAPRO) tablet 150 mg  150 mg Oral Daily Gladstone Lighter, MD      . levothyroxine (SYNTHROID, LEVOTHROID) tablet 50 mcg  50 mcg Oral QAC breakfast Gladstone Lighter, MD      . megestrol (  MEGACE) tablet 40 mg  40 mg Oral Daily Gladstone Lighter, MD      . meropenem (MERREM) 1 g in sodium chloride 0.9 % 100 mL IVPB  1 g Intravenous Q8H Cyndee Brightly M, RPH      . ondansetron [facility]) tablet 4 mg  4 mg Oral Q6H PRN Gladstone Lighter, MD       Or  . ondansetron (ZOFRAN) injection 4 mg  4 mg Intravenous Q6H PRN Gladstone Lighter, MD      . pantoprazole (PROTONIX) EC tablet 40 mg  40 mg Oral Daily Gladstone Lighter, MD      . PARoxetine (PAXIL) tablet 30 mg  30 mg Oral Daily Gladstone Lighter, MD      . traMADol Veatrice Bourbon) tablet 50 mg  50 mg  Oral Q6H PRN Gladstone Lighter, MD      . umeclidinium-vilanterol (ANORO ELLIPTA) 62.5-25 MCG/INH 1 puff  1 puff Inhalation Daily Gladstone Lighter, MD      . vancomycin (VANCOCIN) IVPB 1000 mg/200 mL premix  1,000 mg Intravenous Once Gladstone Lighter, MD       Facility-Administered Medications Ordered in Other Encounters  Medication Dose Route Frequency Provider Last Rate Last Dose  . heparin lock flush 100 unit/mL  500 Units Intravenous Once Lloyd Huger, MD         Discharge Medications: Please see discharge summary for a list of discharge medications.  Relevant Imaging Results:  Relevant Lab Results:   Additional Information    Nicole Arnold  Louretta Shorten, LCSWA

## 2017-09-22 NOTE — Consult Note (Signed)
Pharmacy Antibiotic Note  Nicole Arnold is a 84 y.o. female admitted on 09/22/2017 with pneumonia.  Pharmacy has been consulted for Meropenem + Vancomycin dosing.  Plan: Patiently recently had recently finished therapy for pneumonia; has right-sided lung cancer currently going through treatment. She presented with left sided pain. Vancomycin 1250 IV every 24 hours.  Goal trough 15-20 mcg/mL. 1st dose of Vancomycin 1000 grams was given @ 0933. Will check trough prior to 5th dose (0901 @ 1530).  Meropenem 1 gram every 8 hours.   Kinetics Using adjusted body weight of 63.3 kg Ke 0.046 Vd 44.3 T1/2 15.07 Cmin 18.2  Height: 5\' 3"  (160 cm) Weight: 175 lb 14.4 oz (79.8 kg) IBW/kg (Calculated) : 52.4  Temp (24hrs), Avg:97.9 F (36.6 C), Min:97.9 F (36.6 C), Max:97.9 F (36.6 C)  Recent Labs  Lab 09/15/17 0933 09/22/17 0621  WBC 7.1 9.4  CREATININE 1.13* 0.74  LATICACIDVEN  --  1.0    Estimated Creatinine Clearance: 49.6 mL/min (by C-G formula based on SCr of 0.74 mg/dL).    Allergies  Allergen Reactions  . Amoxicillin     Has patient had a PCN reaction causing immediate rash, facial/tongue/throat swelling, SOB or lightheadedness with hypotension: Yes Has patient had a PCN reaction causing severe rash involving mucus membranes or skin necrosis: No Has patient had a PCN reaction that required hospitalization: Unknown Has patient had a PCN reaction occurring within the last 10 years: Unknown If all of the above answers are "NO", then may proceed with Cephalosporin use.   . Codeine     vomiting  . Neosporin [Neomycin-Bacitracin Zn-Polymyx]   . Sulfa Antibiotics Rash    Antimicrobials this admission: 0829 Levofloxacin x 1 0829 Vancomycin >>  0829 Merrem >>   Thank you for allowing pharmacy to be a part of this patient's care.   Paticia Stack, PharmD Pharmacy Resident  09/22/2017 8:42 AM

## 2017-09-23 ENCOUNTER — Inpatient Hospital Stay: Payer: Medicare Other

## 2017-09-23 ENCOUNTER — Inpatient Hospital Stay (HOSPITAL_COMMUNITY)
Admit: 2017-09-23 | Discharge: 2017-09-23 | Disposition: A | Discharge status: Home / Self Care | Payer: Medicare Other | Attending: Internal Medicine | Admitting: Internal Medicine

## 2017-09-23 DIAGNOSIS — I5030 Unspecified diastolic (congestive) heart failure: Secondary | ICD-10-CM

## 2017-09-23 LAB — CBC
HCT: 26.9 % — ABNORMAL LOW (ref 35.0–47.0)
Hemoglobin: 9.4 g/dL — ABNORMAL LOW (ref 12.0–16.0)
MCH: 29.3 pg (ref 26.0–34.0)
MCHC: 34.8 g/dL (ref 32.0–36.0)
MCV: 84.2 fL (ref 80.0–100.0)
Platelets: 441 10*3/uL — ABNORMAL HIGH (ref 150–440)
RBC: 3.19 MIL/uL — ABNORMAL LOW (ref 3.80–5.20)
RDW: 22 % — ABNORMAL HIGH (ref 11.5–14.5)
WBC: 8.3 10*3/uL (ref 3.6–11.0)

## 2017-09-23 LAB — BASIC METABOLIC PANEL
Anion gap: 4 — ABNORMAL LOW (ref 5–15)
BUN: 11 mg/dL (ref 8–23)
CALCIUM: 8.1 mg/dL — AB (ref 8.9–10.3)
CO2: 23 mmol/L (ref 22–32)
Chloride: 98 mmol/L (ref 98–111)
Creatinine, Ser: 0.65 mg/dL (ref 0.44–1.00)
GFR calc non Af Amer: >60 mL/min (ref >60)
Glucose, Bld: 82 mg/dL (ref 70–99)
Potassium: 4.1 mmol/L (ref 3.5–5.1)
SODIUM: 125 mmol/L — AB (ref 135–145)

## 2017-09-23 LAB — PH, BODY FLUID: PH, BODY FLUID: 7.5

## 2017-09-23 LAB — HIV ANTIBODY (ROUTINE TESTING W REFLEX): HIV Screen 4th Generation wRfx: NONREACTIVE

## 2017-09-23 LAB — ECHOCARDIOGRAM COMPLETE
Height: 63 in
Weight: 2769.6 oz

## 2017-09-23 MED ORDER — PREMIER PROTEIN SHAKE
11.0000 [oz_av] | Freq: Two times a day (BID) | ORAL | Status: DC
  Administered 2017-09-24 – 2017-09-26 (×2): 11 [oz_av] via ORAL

## 2017-09-23 MED ORDER — HYDROCOD POLST-CPM POLST ER 10-8 MG/5ML PO SUER
5.0000 mL | Freq: Two times a day (BID) | ORAL | Status: DC | PRN
  Filled 2017-09-23: qty 5

## 2017-09-23 MED ORDER — DOXYCYCLINE HYCLATE 100 MG PO TABS
100.0000 mg | ORAL_TABLET | Freq: Two times a day (BID) | ORAL | Status: DC
  Administered 2017-09-23 – 2017-09-26 (×6): 100 mg via ORAL
  Filled 2017-09-23 (×7): qty 1

## 2017-09-23 MED ORDER — ADULT MULTIVITAMIN W/MINERALS CH
1.0000 | ORAL_TABLET | Freq: Every day | ORAL | Status: DC
  Administered 2017-09-23 – 2017-09-26 (×4): 1 via ORAL
  Filled 2017-09-23 (×4): qty 1

## 2017-09-23 MED ORDER — POLYETHYLENE GLYCOL 3350 17 G PO PACK
17.0000 g | PACK | Freq: Every day | ORAL | Status: DC | PRN
  Administered 2017-09-26: 17 g via ORAL
  Filled 2017-09-23: qty 1

## 2017-09-23 MED ORDER — ALPRAZOLAM 0.5 MG PO TABS
0.5000 mg | ORAL_TABLET | Freq: Three times a day (TID) | ORAL | Status: DC
  Administered 2017-09-23 – 2017-09-26 (×10): 0.5 mg via ORAL
  Filled 2017-09-23 (×10): qty 1

## 2017-09-23 MED ORDER — ENOXAPARIN SODIUM 40 MG/0.4ML ~~LOC~~ SOLN
40.0000 mg | SUBCUTANEOUS | Status: DC
  Administered 2017-09-23 – 2017-09-25 (×3): 40 mg via SUBCUTANEOUS
  Filled 2017-09-23 (×3): qty 0.4

## 2017-09-23 MED ORDER — CEFDINIR 300 MG PO CAPS: 300.0000 mg | ORAL_CAPSULE | Freq: Two times a day (BID) | ORAL | Status: DC

## 2017-09-23 MED ORDER — DOCUSATE SODIUM 100 MG PO CAPS
100.0000 mg | ORAL_CAPSULE | Freq: Two times a day (BID) | ORAL | Status: DC
  Administered 2017-09-23 – 2017-09-26 (×7): 100 mg via ORAL
  Filled 2017-09-23 (×8): qty 1

## 2017-09-23 MED ORDER — LIDOCAINE 5 % EX PTCH
1.0000 | MEDICATED_PATCH | CUTANEOUS | Status: DC
  Administered 2017-09-23 – 2017-09-26 (×4): 1 via TRANSDERMAL
  Filled 2017-09-23 (×5): qty 1

## 2017-09-23 MED ORDER — FUROSEMIDE 10 MG/ML IJ SOLN
40.0000 mg | Freq: Every day | INTRAMUSCULAR | Status: DC
  Administered 2017-09-23: 12:00:00 40 mg via INTRAVENOUS
  Filled 2017-09-23: qty 4

## 2017-09-23 NOTE — Progress Notes (Signed)
Initial Nutrition Assessment  DOCUMENTATION CODES:   Obesity unspecified  INTERVENTION:   -MVI with minerals daily -Premier Protein BID, each supplement provides 160 kcals and 30 grams protein -Liberalize diet to regular  NUTRITION DIAGNOSIS:   Increased nutrient needs related to chronic illness(lung cancer) as evidenced by estimated needs.  GOAL:   Patient will meet greater than or equal to 90% of their needs  MONITOR:   PO intake, Supplement acceptance, Labs, Weight trends, Skin, I & O's  REASON FOR ASSESSMENT:   Malnutrition Screening Tool    ASSESSMENT:   Nicole Arnold  is a 84 y.o. female with a known history of non-small cell lung cancer diagnosed in June 2019 status post chemoradiation and currently on immunotherapy, chronic pancreatitis, hypertension and hypothyroidism presents to hospital secondary to worsening shortness of breath since yesterday.  Pt admitted with rt pleural effusion.   8/29- s/p rt thoracentesis (1.3 L removed)  Reviewed records from [facility]; pertinent PTA orders 40 mg megestrol daily and Premier Protein BID.   Spoke with pt and daughter at bedside. Pt daughter shares that pt has been through major life transitions over the past 8 months related to pt husband's health status, pt's lung cancer, and recent move to SNF. Pt prides herself on being very active and independent. She reports a general decline in health over the past 2-3 months related to lung cancer treatments. She shares that she was tolerating treatments well (although she reports losing 20# when she initially started treatments). However, pt reports she has regained most of her lost weight back, however, her appetite has been variable over the past month. She shares that she often no longer has the taste for food; she estimates she usually consumed about 50-75% of her meals and her husband and daughter often bring her snacks at the facility.   Pt reports concern over sodium  levels and inability to have salt or seasoning on her food. She plans to consume a bag of potato chips later on today.   Reviewed wt hx; ntoed pt has experienced a 10.2% wt loss over the past year, which while not significant, is concerning given decreased oral intake.   Discussed with pt and daughter importance of good meal and supplement intake to promote healing. Pt reports she tolerates Premier Protein supplement well and is willing to drink it, however, daughter states she does not drink it often. Reviewed menu choices with pt and daughter.   Labs reviewed.   NUTRITION - FOCUSED PHYSICAL EXAM:    Most Recent Value  Orbital Region  No depletion  Upper Arm Region  Mild depletion  Thoracic and Lumbar Region  No depletion  Buccal Region  No depletion  Temple Region  No depletion  Clavicle Bone Region  No depletion  Clavicle and Acromion Bone Region  No depletion  Scapular Bone Region  No depletion  Dorsal Hand  Mild depletion  Patellar Region  Mild depletion  Anterior Thigh Region  Mild depletion  Posterior Calf Region  Mild depletion  Edema (RD Assessment)  Mild  Hair  Reviewed  Eyes  Reviewed  Mouth  Reviewed  Skin  Reviewed  Nails  Reviewed       Diet Order:   Diet Order            Diet regular Room service appropriate? Yes; Fluid consistency: Thin  Diet effective now              EDUCATION NEEDS:   Education needs  have been addressed  Skin:     Last BM:  09/21/17  Height:   Ht Readings from Last 1 Encounters:  09/22/17 5\' 3"  (1.6 m)    Weight:   Wt Readings from Last 1 Encounters:  09/22/17 78.5 kg    Ideal Body Weight:  52.3 kg  BMI:  Body mass index is 30.66 kg/m.  Estimated Nutritional Needs:   Kcal:  1550-1750  Protein:  70-85 grams  Fluid:  > 1.5 L    Kairee Kozma A. Jimmye Norman, RD, LDN, CDE Pager: [phone] After hours Pager: [phone]

## 2017-09-23 NOTE — Evaluation (Signed)
Physical Therapy Evaluation Patient Details Name: Nicole Arnold MRN: [id number] DOB: 1928/08/13 Today's Date: 09/23/2017   History of Present Illness  84 y.o. female with a known history of non-small cell lung cancer diagnosed in June 2019 status post chemoradiation and currently on immunotherapy, chronic pancreatitis, hypertension and hypothyroidism presents to hospital secondary to worsening shortness of breath.  Clinical Impression  Pt did well with PT despite some pain with L ribs as well as some fatigue with ~70 ft of ambulation using walker.  Her O2 remained in the 90s t/o the effort but she did have some shortness of breath and states that she could not have done much more.  She showed good general strength but is not at her baseline and would benefit from STR.  Follow Up Recommendations SNF    Equipment Recommendations  None recommended by PT    Recommendations for Other Services       Precautions / Restrictions Precautions Precautions: Fall Restrictions Weight Bearing Restrictions: No      Mobility  Bed Mobility               General bed mobility comments: Pt up in recliner, not tested  Transfers Overall transfer level: Modified independent Equipment used: Rolling walker (2 wheeled)             General transfer comment: Pt did well with getting to standing w/o assist, cues for set up and positioning when returning to sitting  Ambulation/Gait Ambulation/Gait assistance: Min guard Gait Distance (Feet): 70 Feet Assistive device: Rolling walker (2 wheeled)       General Gait Details: Pt with slow, cautious, but safe ambulation.  Her O2 stayed in the 90s (dropped from 95 to 91%) with the effort but she did c/o being very tired with the effort.    Stairs            Wheelchair Mobility    Modified Rankin (Stroke Patients Only)       Balance Overall balance assessment: Modified Independent                                           Pertinent Vitals/Pain Pain Assessment: Faces Faces Pain Scale: Hurts even more(L rib pain with coughing)    Home Living Family/patient expects to be discharged to:: Skilled nursing facility                      Prior Function Level of Independence: Independent with assistive device(s)         Comments: recently pt has been weaker, has been more limited with ambulation distance and has had 3+ falls in the last 6 months     Hand Dominance        Extremity/Trunk Assessment   Upper Extremity Assessment Upper Extremity Assessment: Overall WFL for tasks assessed(age appropriate limitations, L slightly pain limited)    Lower Extremity Assessment Lower Extremity Assessment: Overall WFL for tasks assessed(age appropriate limitations)       Communication   Communication: No difficulties  Cognition Arousal/Alertness: Awake/alert Behavior During Therapy: WFL for tasks assessed/performed Overall Cognitive Status: Within Functional Limits for tasks assessed                                        General  Comments      Exercises     Assessment/Plan    PT Assessment Patient needs continued PT services  PT Problem List Decreased strength;Decreased range of motion;Decreased activity tolerance;Decreased balance;Decreased mobility;Decreased knowledge of use of DME;Decreased safety awareness;Decreased knowledge of precautions;Pain;Cardiopulmonary status limiting activity       PT Treatment Interventions DME instruction;Gait training;Functional mobility training;Therapeutic activities;Therapeutic exercise;Balance training;Neuromuscular re-education;Patient/family education    PT Goals (Current goals can be found in the Care Plan section)  Acute Rehab PT Goals Patient Stated Goal: Get stronger at rehab PT Goal Formulation: With patient Time For Goal Achievement: 10/07/17 Potential to Achieve Goals: Good    Frequency Min 2X/week   Barriers to  discharge        Co-evaluation               AM-PAC PT "6 Clicks" Daily Activity  Outcome Measure Difficulty turning over in bed (including adjusting bedclothes, sheets and blankets)?: A Little Difficulty moving from lying on back to sitting on the side of the bed? : A Little Difficulty sitting down on and standing up from a chair with arms (e.g., wheelchair, bedside commode, etc,.)?: None Help needed moving to and from a bed to chair (including a wheelchair)?: None Help needed walking in hospital room?: A Little Help needed climbing 3-5 steps with a railing? : A Little 6 Click Score: 20    End of Session Equipment Utilized During Treatment: Gait belt Activity Tolerance: Patient tolerated treatment well;Patient limited by fatigue Patient left: in chair;with call bell/phone within reach;with family/visitor present Nurse Communication: Mobility status PT Visit Diagnosis: Muscle weakness (generalized) (M62.81);Difficulty in walking, not elsewhere classified (R26.2)    Time: 8563-1497 PT Time Calculation (min) (ACUTE ONLY): 25 min   Charges:   PT Evaluation $PT Eval Low Complexity: 1 Low          Kreg Shropshire, DPT 09/23/2017, 4:29 PM

## 2017-09-23 NOTE — Consult Note (Signed)
Pharmacy Antibiotic Note  Nicole Arnold is a 84 y.o. female admitted on 09/22/2017 with pneumonia.  Pharmacy has been consulted for Meropenem  dosing.  Plan: Patiently recently had recently finished therapy for pneumonia; has right-sided lung cancer currently going through treatment. She presented with left sided pain. Vancomycin discontinued 0829 as MRSA PCR (-)  Continue Meropenem 1 gram every 8 hours.   Height: 5\' 3"  (160 cm) Weight: 173 lb 1.6 oz (78.5 kg) IBW/kg (Calculated) : 52.4  Temp (24hrs), Avg:97.7 F (36.5 C), Min:97.5 F (36.4 C), Max:98.2 F (36.8 C)  Recent Labs  Lab 09/22/17 0621 09/23/17 0551  WBC 9.4 8.3  CREATININE 0.74 0.65  LATICACIDVEN 1.0  --     Estimated Creatinine Clearance: 49.1 mL/min (by C-G formula based on SCr of 0.65 mg/dL).    Allergies  Allergen Reactions  . Amoxicillin     Has patient had a PCN reaction causing immediate rash, facial/tongue/throat swelling, SOB or lightheadedness with hypotension: Yes Has patient had a PCN reaction causing severe rash involving mucus membranes or skin necrosis: No Has patient had a PCN reaction that required hospitalization: Unknown Has patient had a PCN reaction occurring within the last 10 years: Unknown If all of the above answers are "NO", then may proceed with Cephalosporin use.   . Codeine     vomiting  . Neosporin [Neomycin-Bacitracin Zn-Polymyx]   . Sulfa Antibiotics Rash    Antimicrobials this admission: 0829 Levofloxacin x 1 0829 Vancomycin >> 0829 0829 Merrem >>   Thank you for allowing pharmacy to be a part of this patient's care.   Paticia Stack, PharmD Pharmacy Resident  09/23/2017 7:14 AM

## 2017-09-23 NOTE — Progress Notes (Signed)
*  PRELIMINARY RESULTS* Echocardiogram 2D Echocardiogram has been performed.  Nicole Arnold 09/23/2017, 4:39 PM

## 2017-09-23 NOTE — Progress Notes (Addendum)
Nicole Arnold NAME: Nicole Arnold    MR#:  [id number]  DATE OF BIRTH:  02/14/1928  SUBJECTIVE:  CHIEF COMPLAINT:   Chief Complaint  Patient presents with  . Respiratory Distress  . Fall   - Feels like she couldn't breathe again - left sided chest pain hurting her more- when coughing or taking deep breaths  REVIEW OF SYSTEMS:  Review of Systems  Constitutional: Negative for chills, fever and malaise/fatigue.  HENT: Negative for ear discharge, hearing loss and nosebleeds.   Respiratory: Negative for cough, shortness of breath and wheezing.   Cardiovascular: Positive for chest pain. Negative for palpitations.  Gastrointestinal: Negative for abdominal pain, constipation, diarrhea, nausea and vomiting.  Genitourinary: Negative for dysuria and urgency.  Musculoskeletal: Positive for myalgias.  Neurological: Negative for dizziness, focal weakness, seizures, weakness and headaches.  Psychiatric/Behavioral: Negative for depression.    DRUG ALLERGIES:   Allergies  Allergen Reactions  . Amoxicillin     Has patient had a PCN reaction causing immediate rash, facial/tongue/throat swelling, SOB or lightheadedness with hypotension: Yes Has patient had a PCN reaction causing severe rash involving mucus membranes or skin necrosis: No Has patient had a PCN reaction that required hospitalization: Unknown Has patient had a PCN reaction occurring within the last 10 years: Unknown If all of the above answers are "NO", then may proceed with Cephalosporin use.   . Codeine     vomiting  . Neosporin [Neomycin-Bacitracin Zn-Polymyx]   . Sulfa Antibiotics Rash    VITALS:  Blood pressure (!) 153/72, pulse 93, temperature 98.2 F (36.8 C), temperature source Oral, resp. rate 15, height 5\' 3"  (1.6 m), weight 78.5 kg, SpO2 93 %.  PHYSICAL EXAMINATION:  Physical Exam  GENERAL:  84 y.o.-year-old elderly patient lying in the bed with no acute  distress.  EYES: Pupils equal, round, reactive to light and accommodation. No scleral icterus. Extraocular muscles intact.  HEENT: Head atraumatic, normocephalic. Oropharynx and nasopharynx clear.  NECK:  Supple, no jugular venous distention. No thyroid enlargement, no tenderness.  LUNGS: Normal breath sounds bilaterally, no wheezing, rhonchi or crepitation. Decreased right basilar breath sounds and some rales at the bases No use of accessory muscles of respiration.  CARDIOVASCULAR: S1, S2 normal. No  rubs, or gallops. 2/6 systolic murmur present ABDOMEN: Soft, nontender, nondistended. Bowel sounds present. No organomegaly or mass.  EXTREMITIES: No pedal edema, cyanosis, or clubbing.  NEUROLOGIC: Cranial nerves II through XII are intact. Muscle strength 5/5 in all extremities. Sensation intact. Gait not checked.  PSYCHIATRIC: The patient is alert and oriented x 3.  SKIN: No obvious rash, lesion, or ulcer.    LABORATORY PANEL:   CBC Recent Labs  Lab 09/23/17 0551  WBC 8.3  HGB 9.4*  HCT 26.9*  PLT 441*   ------------------------------------------------------------------------------------------------------------------  Chemistries  Recent Labs  Lab 09/22/17 0621 09/23/17 0551  NA 124* 125*  K 4.2 4.1  CL 93* 98  CO2 20* 23  GLUCOSE 89 82  BUN 15 11  CREATININE 0.74 0.65  CALCIUM 8.4* 8.1*  AST 16  --   ALT 11  --   ALKPHOS 52  --   BILITOT 0.6  --    ------------------------------------------------------------------------------------------------------------------  Cardiac Enzymes Recent Labs  Lab 09/22/17 0621  TROPONINI <0.03   ------------------------------------------------------------------------------------------------------------------  RADIOLOGY:  Dg Chest 1 View  Result Date: 09/22/2017 CLINICAL DATA:  Status post right thoracentesis. EXAM: CHEST  1 VIEW COMPARISON:  09/22/2017 FINDINGS: The heart  size and mediastinal contours are within normal limits.  After thoracentesis, there is significant reduction in pleural fluid volume and improved aeration of the right lung. A small amount of basilar pleural fluid remains. No pneumothorax is identified. There is no evidence of pulmonary edema, airspace consolidation or focal nodule. The visualized skeletal structures are unremarkable. IMPRESSION: Significant reduction in right pleural fluid volume after thoracentesis. No pneumothorax identified. Electronically Signed   By: Aletta Edouard M.D.   On: 09/22/2017 13:42   Dg Chest 2 View  Result Date: 09/23/2017 CLINICAL DATA:  Fall.  Left-sided pain. EXAM: CHEST - 2 VIEW COMPARISON:  09/22/2017. FINDINGS: PowerPort catheter noted with tip at cavoatrial junction in stable position. Mild cardiomegaly with pulmonary venous congestion, bilateral interstitial prominence, and bilateral pleural effusions, right side greater than left. Findings consistent with CHF. Multiple old right and left-sided rib fractures. IMPRESSION: 1. PowerPort catheter noted with tip over cavoatrial junction in stable position. 2. Cardiomegaly with pulmonary venous congestion, bilateral interstitial prominence and bilateral pleural effusions, right side greater than left. Findings consistent with CHF. Pneumonitis cannot be excluded. 3. Bilateral old rib fractures. No acute displaced rib fracture or pneumothorax. Electronically Signed   By: Marcello Moores  Register   On: 09/23/2017 10:55   Dg Chest 2 View  Result Date: 09/22/2017 CLINICAL DATA:  Fall.  Chest pain.  Recent pneumonia.  Lung cancer. EXAM: CHEST - 2 VIEW COMPARISON:  07/25/2017. FINDINGS: PowerPort catheter with tip over superior vena cava. Heart size stable. No pulmonary venous congestion. Bilateral pulmonary infiltrates/edema, new from prior exam. Progressive bilateral pleural effusions. Atelectatic changes right upper lung. Right upper lung mass obscured by infiltrates and effusion. No acute bony abnormality. Old right rib fractures.  IMPRESSION: 1. PowerPort catheter noted with tip over superior vena cava. 2. Bilateral pulmonary infiltrates/edema, new from prior exam. Progressive bilateral pleural effusions. Atelectatic changes right upper lung. Right upper lung mass obscured by infiltrates and effusion. Electronically Signed   By: Marcello Moores  Register   On: 09/22/2017 07:26   Ct Chest Wo Contrast  Result Date: 09/22/2017 CLINICAL DATA:  Dyspnea, history of non-small cell lung cancer. EXAM: CT CHEST WITHOUT CONTRAST TECHNIQUE: Multidetector CT imaging of the chest was performed following the standard protocol without IV contrast. COMPARISON:  Radiographs of September 22, 2017. CT scan of July 05, 2017. FINDINGS: Cardiovascular: Atherosclerosis of thoracic aorta is noted without aneurysm formation. Dissection cannot be excluded due the lack of intravenous contrast. Normal cardiac size. No pericardial effusion. Coronary artery calcifications are noted. Right internal jugular catheter is noted with tip in SVC. Mediastinum/Nodes: Thyroid gland and esophagus are unremarkable. Precarinal lymph node measuring 9 mm is noted on image number 51. Stable subcarinal lymph node measuring 1 cm is noted on image number 64. Hilar adenopathy noted on prior exam is not well evaluated currently due to lack of intravenous contrast. Lungs/Pleura: Large right pleural effusion is noted which is increased in size compared to prior exam. No pneumothorax is noted. Right upper lobe mass is again noted measuring 4.1 x 2.3 cm which is smaller compared to prior exam. There appears to be crescent-shaped scarring or fibrosis or atelectasis in right upper lobe. 12 x 6 mm subpleural nodule is noted posteriorly in the left lung apex which is not significantly changed compared to prior exam. Probable focal atelectasis or scarring is noted in lateral portion of lingular segment of left upper lobe. Upper Abdomen: Large complex mass containing calcifications involving the pancreatic head  is again noted and not significantly  changed compared to prior exam. It currently measures 8.6 x 8.3 cm. Musculoskeletal: No chest wall mass or suspicious bone lesions identified. IMPRESSION: Large right pleural effusion is noted which is increased in size compared to prior exam, with adjacent subsegmental atelectasis. 4.1 x 2.3 cm right upper lobe mass is noted which is smaller compared to prior exam. There is interval development of atelectasis, scarring or fibrosis in the right upper lobe. Stable subcarinal and precarinal adenopathy is noted compared to prior exam. Hilar adenopathy noted on prior exam is not well visualized currently due to lack of intravenous contrast. Stable 12 x 6 mm subpleural nodule is noted posteriorly in the left lung apex. Stable large complex mass with calcifications is noted in the pancreatic head. Aortic Atherosclerosis (ICD10-I70.0) and Emphysema (ICD10-J43.9). Electronically Signed   By: Marijo Conception, M.D.   On: 09/22/2017 08:32   US Thoracentesis Asp Pleural Space W/img Guide  Result Date: 09/22/2017 CLINICAL DATA:  Right pleural effusion and history of lung carcinoma. EXAM: ULTRASOUND GUIDED RIGHT THORACENTESIS COMPARISON:  CT of the chest on 09/22/2017 PROCEDURE: An ultrasound guided thoracentesis was thoroughly discussed with the patient and questions answered. The benefits, risks, alternatives and complications were also discussed. The patient understands and wishes to proceed with the procedure. Written consent was obtained. A time-out was performed prior to initiating the procedure. Ultrasound was performed to localize and mark an adequate pocket of fluid in the right chest. The area was then prepped and draped in the normal sterile fashion. 1% Lidocaine was used for local anesthesia. Under ultrasound guidance a 6 French Safe-T-Centesis catheter was introduced. Thoracentesis was performed. The catheter was removed and a dressing applied. COMPLICATIONS: None FINDINGS: A  total of approximately 1.3 L of blood tinged fluid was removed. A fluid sample was sent for laboratory analysis. IMPRESSION: Successful ultrasound guided right thoracentesis yielding 1.3 L of pleural fluid. Electronically Signed   By: Aletta Edouard M.D.   On: 09/22/2017 13:41    EKG:   Orders placed or performed in visit on 09/22/17  . EKG 12-Lead  . EKG 12-Lead  . EKG 12-Lead    ASSESSMENT AND PLAN:   Lidia Clavijo  is a 84 y.o. female with a known history of non-small cell lung cancer diagnosed in June 2019 status post chemoradiation and currently on immunotherapy, chronic pancreatitis, hypertension and hypothyroidism presents to hospital secondary to worsening shortness of breath since yesterday.  1.  Acute respiratory distress-on presentation, currently not hypoxic and on room air -Secondary to worsening right pleural effusion.  s/p thoracentesis and almost 1.3L bloody fluid drained - Patient had thoracentesis done twice since her diagnosis of lung cancer in June 2019.   -CT chest this admission showing some right base pneumonia versus atelectasis. Also also moderate to large right pleural effusion.   - Negative MRSA PCR. On Meropenem- change to oral today -Supplemental oxygen as needed. -Right lung mass appears smaller than her previous CT.  2.  Hyponatremia- SIADH. Initially thought to be hypovolemic and received IV fluids, but CXR showing pulmonary vascular congestion and cardiomegaly.  Started on IV Lasix.  Check echocardiogram.. -If no improvement after diuresis, consider tolvaptan  3.  Stage III squamous cell carcinoma of right lung-status post radiation and chemotherapy (finished mid July 2019) and currently on immunotherapy every 2 weeks. -Oncology consulted.  CT of the chest showing shrinking in the mass size.  But recurrent pleural effusions. -Outpatient follow-up recommended  4.  Hypothyroidism-continue Synthroid  5.  GERD-on PPI  6.  DVT prophylaxis-  Lovenox  Physical therapy consulted Daughter updated at bedside     All the records are reviewed and case discussed with Care Management/Social Workerr. Management plans discussed with the patient, family and they are in agreement.  CODE STATUS:  DNR  TOTAL TIME TAKING CARE OF THIS PATIENT: 38 minutes.   POSSIBLE D/C IN 2 DAYS, DEPENDING ON CLINICAL CONDITION.   Nolberto Cheuvront M.D on 09/23/2017 at 11:44 AM  Between 7am to 6pm - Pager - [phone]  After 6pm go to [email or web address] - password [email or web address]  Office  [phone]  CC: Primary care physician; Venia Carbon, MD

## 2017-09-23 NOTE — Progress Notes (Signed)
Pt and daughter request explaination of medications and orders. Pt and Daughter expressed frustration and discourse stating they had been told twice that pt xanax was ordered has it is taking in facility and it wasn't ordered as taken in facility.  Reviewed with pt and daughter. Obtained print out for pt and daughter. Pharmacy consulted. Pharmacy Resident, [facility] and my self reviewed medications one by one again. Approximately two hours one on one (RN to pt and daughter)  spent reviewing medications, educating on medications and answering questions pt and family had regarding medications. Xanax ordered as given in facility scheduled times have been disclosed and agreed upon with Pt and Pt daughter. Maddie F, RN to take over care.   No further questions by pt and family at time of pt. Handoff. Maddie F RN aware of request to be given pain medication. Requested Pain medication given by Sharalyn Ink RN at [phone] am

## 2017-09-23 NOTE — Progress Notes (Signed)
PT Cancellation Note  Patient Details Name: Nicole Arnold MRN: [id number] DOB: 03/27/1928   Cancelled Treatment:    Reason Eval/Treat Not Completed: Patient declined PT evaluation this AM secondary to L side/back pain with nursing just having set up pt with a heating pad.  Pt reports may be able to participate later in the PM.  Will attempt to see pt at a future date/time as medically appropriate.     Linus Salmons PT, DPT 09/23/17, 11:42 AM

## 2017-09-24 LAB — BASIC METABOLIC PANEL
Anion gap: 6 (ref 5–15)
BUN: 15 mg/dL (ref 8–23)
CHLORIDE: 94 mmol/L — AB (ref 98–111)
CO2: 24 mmol/L (ref 22–32)
CREATININE: 0.79 mg/dL (ref 0.44–1.00)
Calcium: 8.1 mg/dL — ABNORMAL LOW (ref 8.9–10.3)
GFR calc Af Amer: >60 mL/min (ref >60)
GFR calc non Af Amer: >60 mL/min (ref >60)
Glucose, Bld: 87 mg/dL (ref 70–99)
POTASSIUM: 3.8 mmol/L (ref 3.5–5.1)
SODIUM: 124 mmol/L — AB (ref 135–145)

## 2017-09-24 LAB — CYTOLOGY - NON PAP

## 2017-09-24 MED ORDER — KETOROLAC TROMETHAMINE 30 MG/ML IJ SOLN: 15.0000 mg | Freq: Four times a day (QID) | INTRAMUSCULAR | Status: DC | PRN

## 2017-09-24 MED ORDER — FUROSEMIDE 20 MG PO TABS
20.0000 mg | ORAL_TABLET | Freq: Every day | ORAL | Status: DC
  Administered 2017-09-24 – 2017-09-26 (×3): 20 mg via ORAL
  Filled 2017-09-24 (×3): qty 1

## 2017-09-24 MED ORDER — CYCLOBENZAPRINE HCL 10 MG PO TABS
5.0000 mg | ORAL_TABLET | Freq: Two times a day (BID) | ORAL | Status: AC
  Administered 2017-09-24 – 2017-09-25 (×3): 5 mg via ORAL
  Filled 2017-09-24 (×5): qty 1

## 2017-09-24 MED ORDER — KETOROLAC TROMETHAMINE 30 MG/ML IJ SOLN
30.0000 mg | INTRAMUSCULAR | Status: AC
  Administered 2017-09-24: 10:00:00 30 mg via INTRAVENOUS
  Filled 2017-09-24: qty 1

## 2017-09-24 NOTE — Consult Note (Signed)
[facility]  CONSULT NOTE    Date: 09/24/2017                  Patient Name:  Nicole Arnold  MRN: [id number]  DOB: May 20, 1928  Age / Sex: 84 y.o., female         PCP: Venia Carbon, MD                 Service Requesting Consult: Dr. Tressia Miners                 Reason for Consult: Hyponatremia            History of Present Illness: Ms. Nicole Arnold is a 84 y.o. white female with lung cancer nonsmall cell, hypothyroidism, GERD, hypertension, COPD, who was admitted to [facility] on 09/22/2017 for Pleural effusion [J90] Hypoxia [R09.02] Community acquired pneumonia, unspecified laterality [J18.9]   Nephrology consulted for hyponatremia. 124 on admission with no improvement. Baseline sodium of 130 on 8/22.   Patient states she has been taking all her medications as prescribed. Not on diuretics.    Medications: Outpatient medications: Medications Prior to Admission  Medication Sig Dispense Refill Last Dose  . acetaminophen (TYLENOL) 325 MG tablet Take 650 mg by mouth every 4 (four) hours as needed for mild pain or fever.    PRN at PRN  . ALPRAZolam (XANAX) 0.5 MG tablet Take 0.5 mg by mouth 3 (three) times daily.    09/21/2017 at 1900  . alum hydroxide-mag trisilicate (GAVISCON) 45-80 MG CHEW chewable tablet Chew 2 tablets by mouth 3 (three) times daily as needed for indigestion or heartburn.   PRN at PRN  . feeding supplement (BOOST HIGH PROTEIN) LIQD Take 1 Container by mouth 2 (two) times daily between meals.     . lansoprazole (PREVACID) 30 MG capsule Take 30 mg by mouth daily at 12 noon.   09/21/2017 at 1030  . levothyroxine (SYNTHROID, LEVOTHROID) 50 MCG tablet Take 50 mcg by mouth daily before breakfast.   09/21/2017 at 0800  . lidocaine-prilocaine (EMLA) cream Apply 1 application topically as needed.   PRN at PRN  . loperamide (IMODIUM) 2 MG capsule Take 2 capsules (4MG ) by mouth and 1 capsule (2MG ) after each subsequent loose stool - max 8 capsules  daily   PRN at PRN  . megestrol (MEGACE) 40 MG tablet Take 1 tablet (40 mg total) by mouth daily. 30 tablet 0 09/21/2017 at 0800  . MICARDIS 40 MG tablet Take 40 mg by mouth daily.   09/21/2017 at 0800  . ondansetron (ZOFRAN) 8 MG tablet Take 8 mg by mouth 2 (two) times daily as needed for nausea or vomiting.   PRN at PRN  . PARoxetine (PAXIL) 30 MG tablet Take 30 mg by mouth daily.    09/21/2017 at 1200  . traMADol (ULTRAM) 50 MG tablet Take 50 mg by mouth every 4 (four) hours as needed for moderate pain.    PRN at PRN  . umeclidinium-vilanterol (ANORO ELLIPTA) 62.5-25 MCG/INH AEPB Inhale 1 puff daily into the lungs. 180 each 3 09/21/2017 at 0900    Current medications: Current Facility-Administered Medications  Medication Dose Route Frequency Provider Last Rate Last Dose  . acetaminophen (TYLENOL) tablet 650 mg  650 mg Oral Q6H PRN Gladstone Lighter, MD       Or  . acetaminophen (TYLENOL) suppository 650 mg  650 mg Rectal Q6H PRN Gladstone Lighter, MD      . ALPRAZolam Duanne Moron) tablet  0.5 mg  0.5 mg Oral TID Gladstone Lighter, MD   0.5 mg at 09/24/17 1244  . chlorpheniramine-HYDROcodone (TUSSIONEX) 10-8 MG/5ML suspension 5 mL  5 mL Oral Q12H PRN Gladstone Lighter, MD      . cyclobenzaprine (FLEXERIL) tablet 5 mg  5 mg Oral BID Gladstone Lighter, MD      . docusate sodium (COLACE) capsule 100 mg  100 mg Oral BID Gladstone Lighter, MD   100 mg at 09/24/17 0944  . doxycycline (VIBRA-TABS) tablet 100 mg  100 mg Oral BID WC Gladstone Lighter, MD   100 mg at 09/24/17 0945  . enoxaparin (LOVENOX) injection 40 mg  40 mg Subcutaneous Q24H Gladstone Lighter, MD   40 mg at 09/23/17 2244  . furosemide (LASIX) tablet 20 mg  20 mg Oral Daily Gladstone Lighter, MD   20 mg at 09/24/17 0945  . irbesartan (AVAPRO) tablet 150 mg  150 mg Oral Daily Gladstone Lighter, MD   150 mg at 09/24/17 0944  . ketorolac (TORADOL) 30 MG/ML injection 15 mg  15 mg Intravenous Q6H PRN Gladstone Lighter, MD      .  levothyroxine (SYNTHROID, LEVOTHROID) tablet 50 mcg  50 mcg Oral QAC breakfast Gladstone Lighter, MD   50 mcg at 09/24/17 0655  . lidocaine (LIDODERM) 5 % 1 patch  1 patch Transdermal Q24H Gladstone Lighter, MD   Stopped at 09/24/17 1159  . megestrol (MEGACE) tablet 40 mg  40 mg Oral Daily Gladstone Lighter, MD   40 mg at 09/24/17 0944  . multivitamin with minerals tablet 1 tablet  1 tablet Oral Daily Gladstone Lighter, MD   1 tablet at 09/24/17 0944  . ondansetron (ZOFRAN) tablet 4 mg  4 mg Oral Q6H PRN Gladstone Lighter, MD       Or  . ondansetron (ZOFRAN) injection 4 mg  4 mg Intravenous Q6H PRN Gladstone Lighter, MD      . pantoprazole (PROTONIX) EC tablet 40 mg  40 mg Oral Daily Gladstone Lighter, MD   40 mg at 09/24/17 0945  . PARoxetine (PAXIL) tablet 30 mg  30 mg Oral Daily Gladstone Lighter, MD   30 mg at 09/24/17 0944  . polyethylene glycol (MIRALAX / GLYCOLAX) packet 17 g  17 g Oral Daily PRN Gladstone Lighter, MD      . protein supplement (PREMIER PROTEIN) liquid - approved for s/p bariatric surgery  11 oz Oral BID BM Gladstone Lighter, MD   11 oz at 09/24/17 0947  . traMADol (ULTRAM) tablet 50 mg  50 mg Oral Q4H PRN Gouru, Aruna, MD   50 mg at 09/23/17 1652  . umeclidinium-vilanterol (ANORO ELLIPTA) 62.5-25 MCG/INH 1 puff  1 puff Inhalation Daily Gladstone Lighter, MD   1 puff at 09/24/17 0944   Facility-Administered Medications Ordered in Other Encounters  Medication Dose Route Frequency Provider Last Rate Last Dose  . heparin lock flush 100 unit/mL  500 Units Intravenous Once Lloyd Huger, MD          Allergies: Allergies  Allergen Reactions  . Amoxicillin     Has patient had a PCN reaction causing immediate rash, facial/tongue/throat swelling, SOB or lightheadedness with hypotension: Yes Has patient had a PCN reaction causing severe rash involving mucus membranes or skin necrosis: No Has patient had a PCN reaction that required hospitalization: Unknown Has  patient had a PCN reaction occurring within the last 10 years: Unknown If all of the above answers are "NO", then may proceed with Cephalosporin use.   Marland Kitchen  Codeine     vomiting  . Neosporin [Neomycin-Bacitracin Zn-Polymyx]   . Sulfa Antibiotics Rash      Past Medical History: Past Medical History:  Diagnosis Date  . Cataract   . Chronic constipation   . Chronic venous insufficiency   . COPD (chronic obstructive pulmonary disease) ([place])   . Essential hypertension, benign   . GERD (gastroesophageal reflux disease)   . Hypothyroidism   . Mood disorder ([place])    mostly anxiety  . Non-small cell lung cancer with metastasis ([place])   . Osteopenia   . Pancreatic mass   . Pancreatitis since 1961   chronic     Past Surgical History: Past Surgical History:  Procedure Laterality Date  . APPENDECTOMY  1949  . CATARACT EXTRACTION Right 1987  . CATARACT EXTRACTION Left 1986  . detached retina repair Right 1991  . OOPHORECTOMY  1959   right ovary, part of left  . PORTA CATH INSERTION N/A 07/18/2017   Procedure: PORTA CATH INSERTION;  Surgeon: Algernon Huxley, MD;  Location: [place] [facility];  Service: Cardiovascular;  Laterality: N/A;  . RETINAL DETACHMENT SURGERY Left 1997  . THORACOTOMY Left 2008   due to pneumonia  . uterine hysterectomy  1978     Family History: Family History  Problem Relation Age of Onset  . Lung cancer Sister   . Breast cancer Neg Hx      Social History: Social History   Socioeconomic History  . Marital status: Married    Spouse name: Not on file  . Number of children: Not on file  . Years of education: Not on file  . Highest education level: Not on file  Occupational History  . Occupation: Sports administrator and other jobs    Comment: Retired  Scientific laboratory technician  . Financial resource strain: Not on file  . Food insecurity:    Worry: Not on file    Inability: Not on file  . Transportation needs:    Medical: Not on file    Non-medical: Not on file   Tobacco Use  . Smoking status: Former Smoker    Packs/day: 0.50    Years: 30.00    Pack years: 15.00    Types: Cigarettes    Last attempt to quit: 01/25/1993    Years since quitting: 24.6  . Smokeless tobacco: Never Used  Substance and Sexual Activity  . Alcohol use: No  . Drug use: No  . Sexual activity: Not on file  Lifestyle  . Physical activity:    Days per week: Not on file    Minutes per session: Not on file  . Stress: Not on file  Relationships  . Social connections:    Talks on phone: Not on file    Gets together: Not on file    Attends religious service: Not on file    Active member of club or organization: Not on file    Attends meetings of clubs or organizations: Not on file    Relationship status: Not on file  . Intimate partner violence:    Fear of current or ex partner: Not on file    Emotionally abused: Not on file    Physically abused: Not on file    Forced sexual activity: Not on file  Other Topics Concern  . Not on file  Social History Narrative   Has living will   Husband then daughter and SIL (MD) have health care POA   No tube feeds if cognitively unaware  Patient is currently at [facility]     Review of Systems: Review of Systems  Constitutional: Negative.  Negative for chills, diaphoresis, fever, malaise/fatigue and weight loss.  HENT: Negative.  Negative for congestion, ear discharge, ear pain, hearing loss, nosebleeds, sinus pain, sore throat and tinnitus.   Eyes: Negative.  Negative for blurred vision, double vision, photophobia, pain, discharge and redness.  Respiratory: Positive for cough and shortness of breath. Negative for hemoptysis, sputum production, wheezing and stridor.   Cardiovascular: Positive for chest pain. Negative for palpitations, orthopnea, claudication, leg swelling and PND.  Gastrointestinal: Negative.  Negative for abdominal pain, blood in stool, constipation, diarrhea, heartburn, melena, nausea and vomiting.   Genitourinary: Negative.  Negative for dysuria, flank pain, frequency, hematuria and urgency.  Musculoskeletal: Negative.  Negative for back pain, falls, joint pain, myalgias and neck pain.  Skin: Negative.  Negative for itching and rash.  Neurological: Negative.  Negative for dizziness, tingling, tremors, sensory change, speech change, focal weakness, seizures, loss of consciousness, weakness and headaches.  Endo/Heme/Allergies: Negative.  Negative for environmental allergies and polydipsia. Does not bruise/bleed easily.  Psychiatric/Behavioral: Negative.  Negative for depression, hallucinations, memory loss, substance abuse and suicidal ideas. The patient is not nervous/anxious and does not have insomnia.     Vital Signs: Blood pressure (!) 140/55, pulse 77, temperature 98.3 F (36.8 C), temperature source Oral, resp. rate 17, height 5\' 3"  (1.6 m), weight 78.5 kg, SpO2 99 %.  Weight trends: Filed Weights   09/22/17 0626 09/22/17 1123  Weight: 79.8 kg 78.5 kg    Physical Exam: General: NAD,   Head: Normocephalic, atraumatic. Moist oral mucosal membranes  Eyes: Anicteric, PERRL  Neck: Supple, trachea midline  Lungs:  Clear to auscultation  Heart: Regular rate and rhythm  Abdomen:  Soft, nontender,   Extremities:  no peripheral edema.  Neurologic: Nonfocal, moving all four extremities  Skin: No lesions         Lab results: Basic Metabolic Panel: Recent Labs  Lab 09/22/17 0621 09/23/17 0551 09/24/17 0500  NA 124* 125* 124*  K 4.2 4.1 3.8  CL 93* 98 94*  CO2 20* 23 24  GLUCOSE 89 82 87  BUN 15 11 15   CREATININE 0.74 0.65 0.79  CALCIUM 8.4* 8.1* 8.1*    Liver Function Tests: Recent Labs  Lab 09/22/17 0621  AST 16  ALT 11  ALKPHOS 52  BILITOT 0.6  PROT 7.0  ALBUMIN 2.9*   No results for input(s): LIPASE, AMYLASE in the last 168 hours. No results for input(s): AMMONIA in the last 168 hours.  CBC: Recent Labs  Lab 09/22/17 0621 09/23/17 0551  WBC 9.4  8.3  HGB 10.0* 9.4*  HCT 27.8* 26.9*  MCV 83.1 84.2  PLT 451* 441*    Cardiac Enzymes: Recent Labs  Lab 09/22/17 0621  TROPONINI <0.03    BNP: Invalid input(s): POCBNP  CBG: No results for input(s): GLUCAP in the last 168 hours.  Microbiology: Results for orders placed or performed during the hospital encounter of 09/22/17  Culture, blood (routine x 2) Call MD if unable to obtain prior to antibiotics being given     Status: None (Preliminary result)   Collection Time: 09/22/17  9:24 AM  Result Value Ref Range Status   Specimen Description BLOOD RIGHT HAND  Final   Special Requests   Final    BOTTLES DRAWN AEROBIC AND ANAEROBIC Blood Culture results may not be optimal due to an inadequate volume of blood received  in culture bottles   Culture   Final    NO GROWTH 2 DAYS Performed at [facility], [address] [place], [place] [address]    Report Status PENDING  Incomplete  Culture, blood (routine x 2) Call MD if unable to obtain prior to antibiotics being given     Status: None (Preliminary result)   Collection Time: 09/22/17  9:24 AM  Result Value Ref Range Status   Specimen Description BLOOD LEFT HAND  Final   Special Requests   Final    BOTTLES DRAWN AEROBIC AND ANAEROBIC Blood Culture adequate volume   Culture   Final    NO GROWTH 2 DAYS Performed at [facility], [address] [place], [place] [address]    Report Status PENDING  Incomplete  MRSA PCR Screening     Status: None   Collection Time: 09/22/17 10:05 AM  Result Value Ref Range Status   MRSA by PCR NEGATIVE NEGATIVE Final    Comment:        The GeneXpert MRSA Assay (FDA approved for NASAL specimens only), is one component of a comprehensive MRSA colonization surveillance program. It is not intended to diagnose MRSA infection nor to guide or monitor treatment for MRSA infections. Performed at [facility], [address] [place], [place] [address]   Body fluid  culture     Status: None (Preliminary result)   Collection Time: 09/22/17 11:20 AM  Result Value Ref Range Status   Specimen Description   Final    PLEURAL Performed at [facility], [address] [place], [place] [address]    Special Requests   Final    PLEURAL Performed at [facility], [place], [place] [address]    Gram Stain NO WBC SEEN NO ORGANISMS SEEN   Final   Culture   Final    NO GROWTH 2 DAYS Performed at [facility], [place] [address], [place], [place] [address]    Report Status PENDING  Incomplete    Coagulation Studies: Recent Labs    09/22/17 0924  LABPROT 14.4  INR 1.13    Urinalysis: No results for input(s): COLORURINE, LABSPEC, PHURINE, GLUCOSEU, HGBUR, BILIRUBINUR, KETONESUR, PROTEINUR, UROBILINOGEN, NITRITE, LEUKOCYTESUR in the last 72 hours.  Invalid input(s): APPERANCEUR    Imaging: Dg Chest 2 View  Result Date: 09/23/2017 CLINICAL DATA:  Fall.  Left-sided pain. EXAM: CHEST - 2 VIEW COMPARISON:  09/22/2017. FINDINGS: PowerPort catheter noted with tip at cavoatrial junction in stable position. Mild cardiomegaly with pulmonary venous congestion, bilateral interstitial prominence, and bilateral pleural effusions, right side greater than left. Findings consistent with CHF. Multiple old right and left-sided rib fractures. IMPRESSION: 1. PowerPort catheter noted with tip over cavoatrial junction in stable position. 2. Cardiomegaly with pulmonary venous congestion, bilateral interstitial prominence and bilateral pleural effusions, right side greater than left. Findings consistent with CHF. Pneumonitis cannot be excluded. 3. Bilateral old rib fractures. No acute displaced rib fracture or pneumothorax. Electronically Signed   By: Marcello Moores  Register   On: 09/23/2017 10:55      Assessment & Plan: Ms. DONNICE NIELSEN is a 84 y.o. white female with lung cancer nonsmall cell, hypothyroidism, GERD, hypertension, COPD, who  was admitted to [facility] on 09/22/2017    1. Hyponatremia: acute on chronic - Continue fluid restriction  - Started on furosemide - If no improvement, will consider tolvaptan  2. Hypertension: home regimen of olmesartan, currently chanaged to irbesartan.  Blood pressure  at goal.  - Furosemide as above.      LOS: 2 Cariana Karge 8/31/20191:54 PM

## 2017-09-24 NOTE — Progress Notes (Signed)
[facility] [place] NAME: Nicole Arnold    MR#:  [id number]  DATE OF BIRTH:  [birth year]  SUBJECTIVE:  CHIEF COMPLAINT:   Chief Complaint  Patient presents with  . Respiratory Distress  . Fall   -Sodium is still low at 124.  Still complains of pleuritic left-sided chest pain  REVIEW OF SYSTEMS:  Review of Systems  Constitutional: Negative for chills, fever and malaise/fatigue.  HENT: Negative for ear discharge, hearing loss and nosebleeds.   Respiratory: Negative for cough, shortness of breath and wheezing.   Cardiovascular: Positive for chest pain. Negative for palpitations.  Gastrointestinal: Negative for abdominal pain, constipation, diarrhea, nausea and vomiting.  Genitourinary: Negative for dysuria and urgency.  Musculoskeletal: Positive for myalgias.  Neurological: Negative for dizziness, focal weakness, seizures, weakness and headaches.  Psychiatric/Behavioral: Negative for depression.    DRUG ALLERGIES:   Allergies  Allergen Reactions  . Amoxicillin     Has patient had a PCN reaction causing immediate rash, facial/tongue/throat swelling, SOB or lightheadedness with hypotension: Yes Has patient had a PCN reaction causing severe rash involving mucus membranes or skin necrosis: No Has patient had a PCN reaction that required hospitalization: Unknown Has patient had a PCN reaction occurring within the last 10 years: Unknown If all of the above answers are "NO", then may proceed with Cephalosporin use.   . Codeine     vomiting  . Neosporin [Neomycin-Bacitracin Zn-Polymyx]   . Sulfa Antibiotics Rash    VITALS:  Blood pressure (!) 147/72, pulse 79, temperature 98.2 F (36.8 C), temperature source Oral, resp. rate 17, height 5\' 3"  (1.6 m), weight 78.5 kg, SpO2 93 %.  PHYSICAL EXAMINATION:  Physical Exam  GENERAL:  84 y.o.-year-old elderly patient lying in the bed with no acute distress.  EYES: Pupils equal, round,  reactive to light and accommodation. No scleral icterus. Extraocular muscles intact.  HEENT: Head atraumatic, normocephalic. Oropharynx and nasopharynx clear.  NECK:  Supple, no jugular venous distention. No thyroid enlargement, no tenderness.  LUNGS: Normal breath sounds bilaterally, no wheezing, rhonchi or crepitation. Decreased right basilar breath sounds and some rales at the bases No use of accessory muscles of respiration.  CARDIOVASCULAR: S1, S2 normal. No  rubs, or gallops. 2/6 systolic murmur present Tender to touch along the left costal margin ABDOMEN: Soft, nontender, nondistended. Bowel sounds present. No organomegaly or mass.  EXTREMITIES: No pedal edema, cyanosis, or clubbing.  NEUROLOGIC: Cranial nerves II through XII are intact. Muscle strength 5/5 in all extremities. Sensation intact. Gait not checked.  PSYCHIATRIC: The patient is alert and oriented x 3.  SKIN: No obvious rash, lesion, or ulcer.    LABORATORY PANEL:   CBC Recent Labs  Lab 09/23/17 0551  WBC 8.3  HGB 9.4*  HCT 26.9*  PLT 441*   ------------------------------------------------------------------------------------------------------------------  Chemistries  Recent Labs  Lab 09/22/17 0621  09/24/17 0500  NA 124*   < > 124*  K 4.2   < > 3.8  CL 93*   < > 94*  CO2 20*   < > 24  GLUCOSE 89   < > 87  BUN 15   < > 15  CREATININE 0.74   < > 0.79  CALCIUM 8.4*   < > 8.1*  AST 16  --   --   ALT 11  --   --   ALKPHOS 52  --   --   BILITOT 0.6  --   --    < > =  values in this interval not displayed.   ------------------------------------------------------------------------------------------------------------------  Cardiac Enzymes Recent Labs  Lab 09/22/17 0621  TROPONINI <0.03   ------------------------------------------------------------------------------------------------------------------  RADIOLOGY:  Dg Chest 1 View  Result Date: 09/22/2017 CLINICAL DATA:  Status post right  thoracentesis. EXAM: CHEST  1 VIEW COMPARISON:  09/22/2017 FINDINGS: The heart size and mediastinal contours are within normal limits. After thoracentesis, there is significant reduction in pleural fluid volume and improved aeration of the right lung. A small amount of basilar pleural fluid remains. No pneumothorax is identified. There is no evidence of pulmonary edema, airspace consolidation or focal nodule. The visualized skeletal structures are unremarkable. IMPRESSION: Significant reduction in right pleural fluid volume after thoracentesis. No pneumothorax identified. Electronically Signed   By: Aletta Edouard M.D.   On: 09/22/2017 13:42   Dg Chest 2 View  Result Date: 09/23/2017 CLINICAL DATA:  Fall.  Left-sided pain. EXAM: CHEST - 2 VIEW COMPARISON:  09/22/2017. FINDINGS: PowerPort catheter noted with tip at cavoatrial junction in stable position. Mild cardiomegaly with pulmonary venous congestion, bilateral interstitial prominence, and bilateral pleural effusions, right side greater than left. Findings consistent with CHF. Multiple old right and left-sided rib fractures. IMPRESSION: 1. PowerPort catheter noted with tip over cavoatrial junction in stable position. 2. Cardiomegaly with pulmonary venous congestion, bilateral interstitial prominence and bilateral pleural effusions, right side greater than left. Findings consistent with CHF. Pneumonitis cannot be excluded. 3. Bilateral old rib fractures. No acute displaced rib fracture or pneumothorax. Electronically Signed   By: Marcello Moores  Register   On: 09/23/2017 10:55   US Thoracentesis Asp Pleural Space W/img Guide  Result Date: 09/22/2017 CLINICAL DATA:  Right pleural effusion and history of lung carcinoma. EXAM: ULTRASOUND GUIDED RIGHT THORACENTESIS COMPARISON:  CT of the chest on 09/22/2017 PROCEDURE: An ultrasound guided thoracentesis was thoroughly discussed with the patient and questions answered. The benefits, risks, alternatives and  complications were also discussed. The patient understands and wishes to proceed with the procedure. Written consent was obtained. A time-out was performed prior to initiating the procedure. Ultrasound was performed to localize and mark an adequate pocket of fluid in the right chest. The area was then prepped and draped in the normal sterile fashion. 1% Lidocaine was used for local anesthesia. Under ultrasound guidance a 6 French Safe-T-Centesis catheter was introduced. Thoracentesis was performed. The catheter was removed and a dressing applied. COMPLICATIONS: None FINDINGS: A total of approximately 1.3 L of blood tinged fluid was removed. A fluid sample was sent for laboratory analysis. IMPRESSION: Successful ultrasound guided right thoracentesis yielding 1.3 L of pleural fluid. Electronically Signed   By: Aletta Edouard M.D.   On: 09/22/2017 13:41    EKG:   Orders placed or performed in visit on 09/22/17  . EKG 12-Lead  . EKG 12-Lead  . EKG 12-Lead    ASSESSMENT AND PLAN:   Nicole Arnold  is a 84 y.o. female with a known history of non-small cell lung cancer diagnosed in June 2019 status post chemoradiation and currently on immunotherapy, chronic pancreatitis, hypertension and hypothyroidism presents to hospital secondary to worsening shortness of breath since yesterday.  1.  Acute respiratory distress-on presentation, currently not hypoxic and on room air -Secondary to worsening right pleural effusion.  s/p right thoracentesis and almost 1.3L bloody fluid drained - Patient had thoracentesis done twice since her diagnosis of lung cancer in June 2019.   -CT chest this admission showing some right base pneumonia versus atelectasis. Also also moderate to large right pleural effusion.   -  Negative MRSA PCR.  -On oral doxycycline - off oxygen -Right lung mass appears smaller than her previous CT.  2.  Hyponatremia- SIADH. Initially thought to be hypovolemic and received IV fluids, but CXR  showing pulmonary vascular congestion and cardiomegaly.  Started on  Lasix.   -Echocardiogram with EF of 22%, mild diastolic dysfunction. -Nephrology consulted to consider tolvaptan  3.  Stage III squamous cell carcinoma of right lung-status post radiation and chemotherapy (finished mid July 2019) and currently on immunotherapy every 2 weeks. -Oncology consulted.  CT of the chest showing shrinking in the mass size.  But recurrent pleural effusions. -Outpatient follow-up recommended  4.  Hypothyroidism-continue Synthroid  5.    Left-sided chest pain-likely musculoskeletal in origin-tender to touch costochondral margin.  Chest x-ray with old bilateral rib fractures. -Lidocaine patch, K pad, and muscle relaxants and Toradol as needed  6.  DVT prophylaxis- Lovenox  Physical therapy consulted Daughter and son-in-law updated at bedside     All the records are reviewed and case discussed with Care Management/Social Workerr. Management plans discussed with the patient, family and they are in agreement.  CODE STATUS:  DNR  TOTAL TIME TAKING CARE OF THIS PATIENT: 41 minutes.   POSSIBLE D/C IN 2 DAYS, DEPENDING ON CLINICAL CONDITION.   Gladstone Lighter M.D on 09/24/2017 at 9:28 AM  Between 7am to 6pm - Pager - [phone]  After 6pm go to [email or web address] - password [email or web address] [facility]  Office  [phone]  CC: Primary care physician; Venia Carbon, MD

## 2017-09-25 ENCOUNTER — Inpatient Hospital Stay: Payer: Medicare Other

## 2017-09-25 LAB — BODY FLUID CULTURE
CULTURE: NO GROWTH
GRAM STAIN: NONE SEEN

## 2017-09-25 LAB — BASIC METABOLIC PANEL
ANION GAP: 9 (ref 5–15)
Anion gap: 7 (ref 5–15)
BUN: 23 mg/dL (ref 8–23)
BUN: 20 mg/dL (ref 8–23)
CO2: 24 mmol/L (ref 22–32)
CO2: 22 mmol/L (ref 22–32)
Calcium: 8.2 mg/dL — ABNORMAL LOW (ref 8.9–10.3)
Calcium: 7.9 mg/dL — ABNORMAL LOW (ref 8.9–10.3)
Chloride: 94 mmol/L — ABNORMAL LOW (ref 98–111)
Chloride: 92 mmol/L — ABNORMAL LOW (ref 98–111)
Creatinine, Ser: 0.92 mg/dL (ref 0.44–1.00)
Creatinine, Ser: 0.87 mg/dL (ref 0.44–1.00)
GFR calc Af Amer: >60 mL/min (ref >60)
GFR calc non Af Amer: 54 mL/min — ABNORMAL LOW (ref >60)
GFR, EST NON AFRICAN AMERICAN: 58 mL/min — AB (ref >60)
Glucose, Bld: 89 mg/dL (ref 70–99)
Glucose, Bld: 115 mg/dL — ABNORMAL HIGH (ref 70–99)
Potassium: 4.5 mmol/L (ref 3.5–5.1)
Potassium: 4 mmol/L (ref 3.5–5.1)
SODIUM: 125 mmol/L — AB (ref 135–145)
Sodium: 123 mmol/L — ABNORMAL LOW (ref 135–145)

## 2017-09-25 LAB — SODIUM: Sodium: 126 mmol/L — ABNORMAL LOW (ref 135–145)

## 2017-09-25 MED ORDER — TOLVAPTAN 15 MG PO TABS
15.0000 mg | ORAL_TABLET | ORAL | Status: DC
  Administered 2017-09-25: 14:00:00 15 mg via ORAL
  Filled 2017-09-25 (×2): qty 1

## 2017-09-25 NOTE — Progress Notes (Signed)
[facility] [place] NAME: Nicole Arnold    MR#:  [id number]  DATE OF BIRTH:  [birth year]  SUBJECTIVE:  CHIEF COMPLAINT:   Chief Complaint  Patient presents with  . Respiratory Distress  . Fall   - coughing spell last night, didn't sleep well because of that - sodium still at 125  REVIEW OF SYSTEMS:  Review of Systems  Constitutional: Negative for chills, fever and malaise/fatigue.  HENT: Negative for ear discharge, hearing loss and nosebleeds.   Respiratory: Positive for cough. Negative for shortness of breath and wheezing.   Cardiovascular: Positive for chest pain. Negative for palpitations.  Gastrointestinal: Negative for abdominal pain, constipation, diarrhea, nausea and vomiting.  Genitourinary: Negative for dysuria and urgency.  Musculoskeletal: Positive for myalgias.  Neurological: Negative for dizziness, focal weakness, seizures, weakness and headaches.  Psychiatric/Behavioral: Negative for depression.    DRUG ALLERGIES:   Allergies  Allergen Reactions  . Amoxicillin     Has patient had a PCN reaction causing immediate rash, facial/tongue/throat swelling, SOB or lightheadedness with hypotension: Yes Has patient had a PCN reaction causing severe rash involving mucus membranes or skin necrosis: No Has patient had a PCN reaction that required hospitalization: Unknown Has patient had a PCN reaction occurring within the last 10 years: Unknown If all of the above answers are "NO", then may proceed with Cephalosporin use.   . Codeine     vomiting  . Neosporin [Neomycin-Bacitracin Zn-Polymyx]   . Sulfa Antibiotics Rash    VITALS:  Blood pressure (!) 144/62, pulse 82, temperature 98 F (36.7 C), temperature source Oral, resp. rate 19, height 5\' 3"  (1.6 m), weight 78.5 kg, SpO2 92 %.  PHYSICAL EXAMINATION:  Physical Exam  GENERAL:  84 y.o.-year-old elderly patient lying in the bed with no acute distress.  EYES: Pupils  equal, round, reactive to light and accommodation. No scleral icterus. Extraocular muscles intact.  HEENT: Head atraumatic, normocephalic. Oropharynx and nasopharynx clear.  NECK:  Supple, no jugular venous distention. No thyroid enlargement, no tenderness.  LUNGS: Normal breath sounds bilaterally, no wheezing, rhonchi or crepitation. Decreased right basilar breath sounds and some rales at the bases No use of accessory muscles of respiration.  CARDIOVASCULAR: S1, S2 normal. No  rubs, or gallops. 2/6 systolic murmur present Tender to touch along the left costal margin ABDOMEN: Soft, nontender, nondistended. Bowel sounds present. No organomegaly or mass.  EXTREMITIES: No pedal edema, cyanosis, or clubbing.  NEUROLOGIC: Cranial nerves II through XII are intact. Muscle strength 5/5 in all extremities. Sensation intact. Gait not checked.  PSYCHIATRIC: The patient is alert and oriented x 3.  SKIN: No obvious rash, lesion, or ulcer.    LABORATORY PANEL:   CBC Recent Labs  Lab 09/23/17 0551  WBC 8.3  HGB 9.4*  HCT 26.9*  PLT 441*   ------------------------------------------------------------------------------------------------------------------  Chemistries  Recent Labs  Lab 09/22/17 0621  09/25/17 1058  NA 124*   < > 123*  K 4.2   < > 4.0  CL 93*   < > 92*  CO2 20*   < > 22  GLUCOSE 89   < > 115*  BUN 15   < > 20  CREATININE 0.74   < > 0.87  CALCIUM 8.4*   < > 7.9*  AST 16  --   --   ALT 11  --   --   ALKPHOS 52  --   --   BILITOT 0.6  --   --    < > =  values in this interval not displayed.   ------------------------------------------------------------------------------------------------------------------  Cardiac Enzymes Recent Labs  Lab 09/22/17 0621  TROPONINI <0.03   ------------------------------------------------------------------------------------------------------------------  RADIOLOGY:  Dg Chest 2 View  Result Date: 09/25/2017 CLINICAL DATA:  84 year old  female currently admitted for congestive heart failure. Clinical history includes stage III squamous cell carcinoma of the right lung status post radiation and chemotherapy completed in July 2019 currently on every other week immunotherapy. EXAM: CHEST - 2 VIEW COMPARISON:  Prior chest x-ray 09/23/2017 FINDINGS: Stable right IJ approach single-lumen power injectable port catheter with the tip overlying the cavoatrial junction. The cardiac and mediastinal contours remain unchanged in within normal limits. Atherosclerotic calcifications are present in the transverse aorta. Moderate right pleural effusion with some fluid trapped in the major and minor fissures. There may also be a small left pleural effusion. Extensive background pulmonary parenchymal changes including chronic bronchitic changes and emphysema. Bibasilar atelectasis. Improved pulmonary vascular congestion. No overt pulmonary edema. No pneumothorax. No acute osseous abnormality. IMPRESSION: 1. Interval resolution of pulmonary vascular congestion. 2. Persistent moderate layering right pleural effusion with fluid in the major and minor fissures. 3. Advanced bilateral chronic bronchitic changes and emphysema. 4. Bibasilar atelectasis and scarring. 5.  Aortic Atherosclerosis (ICD10-170.0) Electronically Signed   By: Jacqulynn Cadet M.D.   On: 09/25/2017 09:17    EKG:   Orders placed or performed in visit on 09/22/17  . EKG 12-Lead  . EKG 12-Lead  . EKG 12-Lead    ASSESSMENT AND PLAN:   Nicole Arnold  is a 84 y.o. female with a known history of non-small cell lung cancer diagnosed in June 2019 status post chemoradiation and currently on immunotherapy, chronic pancreatitis, hypertension and hypothyroidism presents to hospital secondary to worsening shortness of breath since yesterday.  1.  Acute respiratory distress-on presentation, currently not hypoxic and on room air -Secondary to worsening right pleural effusion.  s/p right  thoracentesis and almost 1.3L bloody fluid drained- cytology negative for any tumor cells - Patient had thoracentesis done twice since her diagnosis of lung cancer in June 2019.   -CT chest this admission showing some right base pneumonia versus atelectasis. Also also moderate to large right pleural effusion.   - Negative MRSA PCR.  -On oral doxycycline - off oxygen -Right lung mass appears smaller than her previous CT.  2.  Hyponatremia- SIADH. Initially thought to be hypovolemic and received IV fluids, but CXR showing pulmonary vascular congestion and cardiomegaly.  Started on  Lasix.   -Echocardiogram with EF of 95%, mild diastolic dysfunction. -Nephrology consulted - started fluid restriction and also on tolvaptan today  3.  Stage III squamous cell carcinoma of right lung-status post radiation and chemotherapy (finished mid July 2019) and currently on immunotherapy every 2 weeks. -Oncology consulted.  CT of the chest showing shrinking in the mass size.  But recurrent pleural effusions. -Outpatient follow-up recommended - PET scan scheduled for this week  4.  Hypothyroidism-continue Synthroid  5.    Left-sided chest pain-likely musculoskeletal in origin-tender to touch costochondral margin. Seems like rib fracture - Chest x-ray with old bilateral rib fractures. -Lidocaine patch, K pad, and muscle relaxants and Toradol as needed  6.  DVT prophylaxis- Lovenox  Physical therapy consulted Daughter and son-in-law updated at bedside     All the records are reviewed and case discussed with Care Management/Social Workerr. Management plans discussed with the patient, family and they are in agreement.  CODE STATUS:  DNR  TOTAL TIME TAKING CARE OF THIS  PATIENT: 41 minutes.   POSSIBLE D/C IN 2 DAYS, DEPENDING ON CLINICAL CONDITION.   Gladstone Lighter M.D on 09/25/2017 at 11:50 AM  Between 7am to 6pm - Pager - [phone]  After 6pm go to [email or web address] - password [email or web address]  [facility]  Office  [phone]  CC: Primary care physician; Venia Carbon, MD

## 2017-09-25 NOTE — Progress Notes (Signed)
[facility]  ROUNDING NOTE   Subjective:   Complains of coughing. States this kept her up overnight.   Na 125  Tolvaptan administered this morning.   Objective:  Vital signs in last 24 hours:  Temp:  [98 F (36.7 C)-98.3 F (36.8 C)] 98 F (36.7 C) (09/01 0449) Pulse Rate:  [77-86] 82 (09/01 0449) Resp:  [15-19] 19 (09/01 0449) BP: (125-144)/(55-62) 144/62 (09/01 0449) SpO2:  [92 %-99 %] 92 % (09/01 0449)  Weight change:  Filed Weights   09/22/17 0626 09/22/17 1123  Weight: 79.8 kg 78.5 kg    Intake/Output: I/O last 3 completed shifts: In: 840 [P.O.:840] Out: -    Intake/Output this shift:  Total I/O In: 360 [P.O.:360] Out: -   Physical Exam: General: NAD,   Head: Normocephalic, atraumatic. Moist oral mucosal membranes  Eyes: Anicteric, PERRL  Neck: Supple, trachea midline  Lungs:  Clear to auscultation  Heart: Regular rate and rhythm  Abdomen:  Soft, nontender,   Extremities:  no peripheral edema.  Neurologic: Nonfocal, moving all four extremities  Skin: No lesions        Basic Metabolic Panel: Recent Labs  Lab 09/22/17 0621 09/23/17 0551 09/24/17 0500 09/25/17 0358 09/25/17 1058  NA 124* 125* 124* 125* 123*  K 4.2 4.1 3.8 4.5 4.0  CL 93* 98 94* 94* 92*  CO2 20* 23 24 24 22   GLUCOSE 89 82 87 89 115*  BUN 15 11 15 23 20   CREATININE 0.74 0.65 0.79 0.92 0.87  CALCIUM 8.4* 8.1* 8.1* 8.2* 7.9*    Liver Function Tests: Recent Labs  Lab 09/22/17 0621  AST 16  ALT 11  ALKPHOS 52  BILITOT 0.6  PROT 7.0  ALBUMIN 2.9*   No results for input(s): LIPASE, AMYLASE in the last 168 hours. No results for input(s): AMMONIA in the last 168 hours.  CBC: Recent Labs  Lab 09/22/17 0621 09/23/17 0551  WBC 9.4 8.3  HGB 10.0* 9.4*  HCT 27.8* 26.9*  MCV 83.1 84.2  PLT 451* 441*    Cardiac Enzymes: Recent Labs  Lab 09/22/17 0621  TROPONINI <0.03    BNP: Invalid input(s): POCBNP  CBG: No results for input(s): GLUCAP in the  last 168 hours.  Microbiology: Results for orders placed or performed during the hospital encounter of 09/22/17  Culture, blood (routine x 2) Call MD if unable to obtain prior to antibiotics being given     Status: None (Preliminary result)   Collection Time: 09/22/17  9:24 AM  Result Value Ref Range Status   Specimen Description BLOOD RIGHT HAND  Final   Special Requests   Final    BOTTLES DRAWN AEROBIC AND ANAEROBIC Blood Culture results may not be optimal due to an inadequate volume of blood received in culture bottles   Culture   Final    NO GROWTH 3 DAYS Performed at [facility], [address], [place], [place] [address]    Report Status PENDING  Incomplete  Culture, blood (routine x 2) Call MD if unable to obtain prior to antibiotics being given     Status: None (Preliminary result)   Collection Time: 09/22/17  9:24 AM  Result Value Ref Range Status   Specimen Description BLOOD LEFT HAND  Final   Special Requests   Final    BOTTLES DRAWN AEROBIC AND ANAEROBIC Blood Culture adequate volume   Culture   Final    NO GROWTH 3 DAYS Performed at [facility], [address] [place], [place] [address]    Report Status PENDING  Incomplete  MRSA PCR Screening     Status: None   Collection Time: 09/22/17 10:05 AM  Result Value Ref Range Status   MRSA by PCR NEGATIVE NEGATIVE Final    Comment:        The GeneXpert MRSA Assay (FDA approved for NASAL specimens only), is one component of a comprehensive MRSA colonization surveillance program. It is not intended to diagnose MRSA infection nor to guide or monitor treatment for MRSA infections. Performed at [facility], [address] [place], [place] [address]   Body fluid culture     Status: None (Preliminary result)   Collection Time: 09/22/17 11:20 AM  Result Value Ref Range Status   Specimen Description   Final    PLEURAL Performed at [facility], [address], [place],  [place] [address]    Special Requests   Final    PLEURAL Performed at [facility], [place], [place] [address]    Gram Stain NO WBC SEEN NO ORGANISMS SEEN   Final   Culture   Final    NO GROWTH 3 DAYS Performed at [facility], [place] [address], [place], [place] [address]    Report Status PENDING  Incomplete    Coagulation Studies: No results for input(s): LABPROT, INR in the last 72 hours.  Urinalysis: No results for input(s): COLORURINE, LABSPEC, PHURINE, GLUCOSEU, HGBUR, BILIRUBINUR, KETONESUR, PROTEINUR, UROBILINOGEN, NITRITE, LEUKOCYTESUR in the last 72 hours.  Invalid input(s): APPERANCEUR    Imaging: Dg Chest 2 View  Result Date: 09/25/2017 CLINICAL DATA:  84 year old female currently admitted for congestive heart failure. Clinical history includes stage III squamous cell carcinoma of the right lung status post radiation and chemotherapy completed in July 2019 currently on every other week immunotherapy. EXAM: CHEST - 2 VIEW COMPARISON:  Prior chest x-ray 09/23/2017 FINDINGS: Stable right IJ approach single-lumen power injectable port catheter with the tip overlying the cavoatrial junction. The cardiac and mediastinal contours remain unchanged in within normal limits. Atherosclerotic calcifications are present in the transverse aorta. Moderate right pleural effusion with some fluid trapped in the major and minor fissures. There may also be a small left pleural effusion. Extensive background pulmonary parenchymal changes including chronic bronchitic changes and emphysema. Bibasilar atelectasis. Improved pulmonary vascular congestion. No overt pulmonary edema. No pneumothorax. No acute osseous abnormality. IMPRESSION: 1. Interval resolution of pulmonary vascular congestion. 2. Persistent moderate layering right pleural effusion with fluid in the major and minor fissures. 3. Advanced bilateral chronic bronchitic changes and emphysema. 4. Bibasilar atelectasis and  scarring. 5.  Aortic Atherosclerosis (ICD10-170.0) Electronically Signed   By: Jacqulynn Cadet M.D.   On: 09/25/2017 09:17     Medications:    . ALPRAZolam  0.5 mg Oral TID  . cyclobenzaprine  5 mg Oral BID  . docusate sodium  100 mg Oral BID  . doxycycline  100 mg Oral BID WC  . enoxaparin (LOVENOX) injection  40 mg Subcutaneous Q24H  . furosemide  20 mg Oral Daily  . irbesartan  150 mg Oral Daily  . levothyroxine  50 mcg Oral QAC breakfast  . lidocaine  1 patch Transdermal Q24H  . megestrol  40 mg Oral Daily  . multivitamin with minerals  1 tablet Oral Daily  . pantoprazole  40 mg Oral Daily  . PARoxetine  30 mg Oral Daily  . protein supplement shake  11 oz Oral BID BM  .  tolvaptan  15 mg Oral Q24H  . umeclidinium-vilanterol  1 puff Inhalation Daily   acetaminophen **OR** acetaminophen, chlorpheniramine-HYDROcodone, ketorolac, ondansetron **OR** ondansetron (ZOFRAN) IV, polyethylene glycol, traMADol  Assessment/ Plan:  Nicole Arnold is a 84 y.o. white female with with lung cancer nonsmall cell, hypothyroidism, GERD, hypertension, COPD, who was admitted to [facility] on 09/22/2017    1. Hyponatremia: acute on chronic. Baseline 127-133 for last 1 year - Continue fluid restriction  - Started on furosemide - tolvaptan 15mg  x 1 today - Continue to monitor serum sodium levels.   2. Hypertension: home regimen of olmesartan, currently changed to irbesartan.  Blood pressure at goal.  - Furosemide as above.    LOS: 3 Mackinzee Roszak 9/1/201912:11 PM

## 2017-09-26 LAB — BASIC METABOLIC PANEL
Anion gap: 8 (ref 5–15)
BUN: 21 mg/dL (ref 8–23)
CALCIUM: 8.3 mg/dL — AB (ref 8.9–10.3)
CO2: 24 mmol/L (ref 22–32)
CREATININE: 0.84 mg/dL (ref 0.44–1.00)
Chloride: 100 mmol/L (ref 98–111)
GFR calc non Af Amer: >60 mL/min (ref >60)
Glucose, Bld: 88 mg/dL (ref 70–99)
Potassium: 4.1 mmol/L (ref 3.5–5.1)
SODIUM: 132 mmol/L — AB (ref 135–145)

## 2017-09-26 LAB — SODIUM: SODIUM: 131 mmol/L — AB (ref 135–145)

## 2017-09-26 MED ORDER — CYCLOBENZAPRINE HCL 10 MG PO TABS
5.0000 mg | ORAL_TABLET | Freq: Two times a day (BID) | ORAL | Status: DC
  Administered 2017-09-26: 09:00:00 5 mg via ORAL
  Filled 2017-09-26: qty 1

## 2017-09-26 MED ORDER — SODIUM CHLORIDE 0.9% FLUSH: 10.0000 mL | INTRAVENOUS | Status: DC | PRN

## 2017-09-26 MED ORDER — SODIUM CHLORIDE 0.9% FLUSH
10.0000 mL | INTRAVENOUS | Status: AC | PRN
  Administered 2017-09-26: 12:00:00 10 mL

## 2017-09-26 MED ORDER — FUROSEMIDE 20 MG PO TABS: 20.0000 mg | ORAL_TABLET | Freq: Every day | ORAL | 1 refills | Status: AC

## 2017-09-26 MED ORDER — DOXYCYCLINE HYCLATE 100 MG PO TABS: 100.0000 mg | ORAL_TABLET | Freq: Two times a day (BID) | ORAL | 0 refills | Status: AC

## 2017-09-26 MED ORDER — CYCLOBENZAPRINE HCL 5 MG PO TABS: 5.0000 mg | ORAL_TABLET | Freq: Two times a day (BID) | ORAL | 0 refills | Status: DC | PRN

## 2017-09-26 MED ORDER — POLYETHYLENE GLYCOL 3350 17 G PO PACK: 17.0000 g | PACK | Freq: Every day | ORAL | 0 refills | Status: AC | PRN

## 2017-09-26 MED ORDER — HEPARIN SOD (PORK) LOCK FLUSH 100 UNIT/ML IV SOLN
500.0000 [IU] | INTRAVENOUS | Status: AC | PRN
  Administered 2017-09-26: 12:00:00 500 [IU]
  Filled 2017-09-26: qty 5

## 2017-09-26 MED ORDER — LIDOCAINE 5 % EX PTCH: 1.0000 | MEDICATED_PATCH | CUTANEOUS | 0 refills | Status: DC

## 2017-09-26 NOTE — Progress Notes (Signed)
Mild forgetfulness noted. Alert/oriented. VSS;afebrile. Eating well. Up to BR and chair. Dgt actively participating in pt care. TC report given to Josephina Shih RN at [facility] with pt accepted. Oral and written AVS reviewed with pt and dgt with discharge packet with AVS sent with dgt who is tranporting pt to [facility]. Pt transported in pt in transport chair to private vechicle.Discharge to [place]/SNF accompanied by dgt.

## 2017-09-26 NOTE — Care Management Important Message (Signed)
Important Message  Patient Details  Name: Nicole Arnold MRN: [id number] Date of Birth: 07/20/1928   Medicare Important Message Given:  Yes    [name] [name] 09/26/2017, 12:21 PM

## 2017-09-26 NOTE — Progress Notes (Signed)
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  INTERVAL HISTORY: Patient returns to clinic today for hospital follow-up and discussion of her imaging results.  Her shortness of breath has significantly improved after recent thoracentesis.  She continues to have increased weakness and fatigue, but states this is mildly improved over the past several days. She has no neurologic complaints.  She denies any fevers.  She denies any chest pain, hemoptysis, or cough.  She has a poor appetite, but states this is improving. She denies any nausea, vomiting, constipation, or diarrhea. She has no urinary complaints.  Patient offers no further specific complaints today.  REVIEW OF SYSTEMS:   Review of Systems  Constitutional: Positive for malaise/fatigue. Negative for fever and weight loss.  Respiratory: Positive for shortness of breath. Negative for cough.   Cardiovascular: Negative.  Negative for chest pain and leg swelling.  Gastrointestinal: Negative for abdominal pain, constipation and diarrhea.  Genitourinary: Negative.  Negative for dysuria.  Musculoskeletal: Negative.  Negative for back pain.  Skin: Negative.  Negative for rash.  Neurological: Positive for weakness. Negative for sensory change, focal weakness and headaches.  Psychiatric/Behavioral: Negative.  Negative for depression. The patient is not nervous/anxious.     As per HPI. Otherwise, a complete review of systems is negative.  PAST MEDICAL HISTORY: Past Medical History:  Diagnosis Date  . Cataract   . Chronic constipation   . Chronic venous insufficiency   . COPD (chronic obstructive pulmonary disease) ([place])   .  Essential hypertension, benign   . GERD (gastroesophageal reflux disease)   . Hypothyroidism   . Mood disorder ([place])    mostly anxiety  . Non-small cell lung cancer with metastasis ([place])   . Osteopenia   . Pancreatic mass   . Pancreatitis since 1961   chronic    PAST SURGICAL HISTORY: Past Surgical History:  Procedure Laterality Date  . APPENDECTOMY  1949  . CATARACT EXTRACTION Right 1987  . CATARACT EXTRACTION Left 1986  . detached retina repair Right 1991  . OOPHORECTOMY  1959   right ovary, part of left  . PORTA CATH INSERTION N/A 07/18/2017   Procedure: PORTA CATH INSERTION;  Surgeon: Algernon Huxley, MD;  Location: [facility];  Service: Cardiovascular;  Laterality: N/A;  . RETINAL DETACHMENT SURGERY Left 1997  . THORACOTOMY Left 2008   due to pneumonia  . uterine hysterectomy  1978    FAMILY HISTORY: Family History  Problem Relation Age of Onset  . Lung cancer Sister   . Breast cancer Neg Hx     ADVANCED DIRECTIVES (Y/N):  N  HEALTH MAINTENANCE: Social History   Tobacco Use  . Smoking status: Former Smoker    Packs/day: 0.50    Years: 30.00    Pack years: 15.00    Types: Cigarettes    Last attempt to quit: 01/25/1993    Years since quitting: 24.6  . Smokeless tobacco: Never Used  Substance Use Topics  . Alcohol use: No  . Drug use: No     Colonoscopy:  PAP:  Bone density:  Lipid panel:  Allergies  Allergen Reactions  . Amoxicillin     Has patient had a PCN reaction causing immediate rash, facial/tongue/throat  swelling, SOB or lightheadedness with hypotension: Yes Has patient had a PCN reaction causing severe rash involving mucus membranes or skin necrosis: No Has patient had a PCN reaction that required hospitalization: Unknown Has patient had a PCN reaction occurring within the last 10 years: Unknown If all of the above answers are "NO", then may proceed with Cephalosporin use.   . Codeine     vomiting  . Neosporin [Neomycin-Bacitracin  Zn-Polymyx]   . Sulfa Antibiotics Rash    Current Outpatient Medications  Medication Sig Dispense Refill  . acetaminophen (TYLENOL) 325 MG tablet Take 650 mg by mouth every 4 (four) hours as needed for mild pain or fever.     . ALPRAZolam (XANAX) 0.5 MG tablet Take 0.5 mg by mouth 3 (three) times daily.     Marland Kitchen alum hydroxide-mag trisilicate (GAVISCON) 50-56 MG CHEW chewable tablet Chew 2 tablets by mouth 3 (three) times daily as needed for indigestion or heartburn.    . cyclobenzaprine (FLEXERIL) 5 MG tablet Take 1 tablet (5 mg total) by mouth 2 (two) times daily as needed for muscle spasms (muscle pain). 30 tablet 0  . doxycycline (VIBRA-TABS) 100 MG tablet Take 1 tablet (100 mg total) by mouth 2 (two) times daily with a meal for 7 days. 14 tablet 0  . feeding supplement (BOOST HIGH PROTEIN) LIQD Take 1 Container by mouth 2 (two) times daily between meals.    . furosemide (LASIX) 20 MG tablet Take 1 tablet (20 mg total) by mouth daily. 30 tablet 1  . hydrocortisone cream 1 % Apply 1 application topically 2 (two) times daily.    . lansoprazole (PREVACID) 30 MG capsule Take 30 mg by mouth daily at 12 noon.    Marland Kitchen levothyroxine (SYNTHROID, LEVOTHROID) 50 MCG tablet Take 50 mcg by mouth daily before breakfast.    . lidocaine (LIDODERM) 5 % Place 1 patch onto the skin daily. Remove & Discard patch within 12 hours or as directed by MD 15 patch 0  . lidocaine-prilocaine (EMLA) cream Apply 1 application topically as needed.    . megestrol (MEGACE) 40 MG tablet Take 1 tablet (40 mg total) by mouth daily. 30 tablet 0  . MICARDIS 40 MG tablet Take 40 mg by mouth daily.    Marland Kitchen PARoxetine (PAXIL) 30 MG tablet Take 30 mg by mouth daily.     Marland Kitchen triamcinolone cream (KENALOG) 0.1 % Apply 1 application topically 3 (three) times daily.    Marland Kitchen umeclidinium-vilanterol (ANORO ELLIPTA) 62.5-25 MCG/INH AEPB Inhale 1 puff daily into the lungs. 180 each 3  . loperamide (IMODIUM) 2 MG capsule Take 2 capsules (4MG) by mouth and  1 capsule (2MG) after each subsequent loose stool - max 8 capsules daily    . ondansetron (ZOFRAN) 8 MG tablet Take 8 mg by mouth 2 (two) times daily as needed for nausea or vomiting.    . polyethylene glycol (MIRALAX / GLYCOLAX) packet Take 17 g by mouth daily as needed for mild constipation. (Patient not taking: Reported on 09/29/2017) 14 each 0  . traMADol (ULTRAM) 50 MG tablet Take 50 mg by mouth every 4 (four) hours as needed for moderate pain.      No current facility-administered medications for this visit.    Facility-Administered Medications Ordered in Other Visits  Medication Dose Route Frequency Provider Last Rate Last Dose  . heparin lock flush 100 unit/mL  500 Units Intravenous Once Grayland Ormond Kathlene November, MD        OBJECTIVE: Vitals:  09/29/17 1011  BP: 118/63  Pulse: 87  Resp: 18  Temp: 98 F (36.7 C)     Body mass index is 30.2 kg/m.    ECOG FS:2 - Symptomatic, <50% confined to bed   General: Well-developed, well-nourished, no acute distress.  Sitting in a wheelchair. Eyes: Pink conjunctiva, anicteric sclera. HEENT: Normocephalic, moist mucous membranes. Lungs: Diminished bilaterally. Heart: Regular rate and rhythm. No rubs, murmurs, or gallops. Abdomen: Soft, nontender, nondistended. No organomegaly noted, normoactive bowel sounds. Musculoskeletal: No edema, cyanosis, or clubbing. Neuro: Alert, answering all questions appropriately. Cranial nerves grossly intact. Skin: No rashes or petechiae noted. Psych: Normal affect.  LAB RESULTS:  Lab Results  Component Value Date   NA 132 (L) 09/29/2017   K 4.0 09/29/2017   CL 102 09/29/2017   CO2 21 (L) 09/29/2017   GLUCOSE 97 09/29/2017   BUN 34 (H) 09/29/2017   CREATININE 1.09 (H) 09/29/2017   CALCIUM 8.7 (L) 09/29/2017   PROT 7.2 09/29/2017   ALBUMIN 3.0 (L) 09/29/2017   AST 19 09/29/2017   ALT 16 09/29/2017   ALKPHOS 60 09/29/2017   BILITOT 0.5 09/29/2017   GFRNONAA 44 (L) 09/29/2017   GFRAA 51 (L)  09/29/2017    Lab Results  Component Value Date   WBC 13.0 (H) 09/29/2017   NEUTROABS 10.5 (H) 09/29/2017   HGB 9.5 (L) 09/29/2017   HCT 28.7 (L) 09/29/2017   MCV 85.2 09/29/2017   PLT 589 (H) 09/29/2017     STUDIES: Dg Chest 1 View  Result Date: 09/22/2017 CLINICAL DATA:  Status post right thoracentesis. EXAM: CHEST  1 VIEW COMPARISON:  09/22/2017 FINDINGS: The heart size and mediastinal contours are within normal limits. After thoracentesis, there is significant reduction in pleural fluid volume and improved aeration of the right lung. A small amount of basilar pleural fluid remains. No pneumothorax is identified. There is no evidence of pulmonary edema, airspace consolidation or focal nodule. The visualized skeletal structures are unremarkable. IMPRESSION: Significant reduction in right pleural fluid volume after thoracentesis. No pneumothorax identified. Electronically Signed   By: Aletta Edouard M.D.   On: 09/22/2017 13:42   Dg Chest 2 View  Result Date: 09/25/2017 CLINICAL DATA:  84 year old female currently admitted for congestive heart failure. Clinical history includes stage III squamous cell carcinoma of the right lung status post radiation and chemotherapy completed in July 2019 currently on every other week immunotherapy. EXAM: CHEST - 2 VIEW COMPARISON:  Prior chest x-ray 09/23/2017 FINDINGS: Stable right IJ approach single-lumen power injectable port catheter with the tip overlying the cavoatrial junction. The cardiac and mediastinal contours remain unchanged in within normal limits. Atherosclerotic calcifications are present in the transverse aorta. Moderate right pleural effusion with some fluid trapped in the major and minor fissures. There may also be a small left pleural effusion. Extensive background pulmonary parenchymal changes including chronic bronchitic changes and emphysema. Bibasilar atelectasis. Improved pulmonary vascular congestion. No overt pulmonary edema. No  pneumothorax. No acute osseous abnormality. IMPRESSION: 1. Interval resolution of pulmonary vascular congestion. 2. Persistent moderate layering right pleural effusion with fluid in the major and minor fissures. 3. Advanced bilateral chronic bronchitic changes and emphysema. 4. Bibasilar atelectasis and scarring. 5.  Aortic Atherosclerosis (ICD10-170.0) Electronically Signed   By: Jacqulynn Cadet M.D.   On: 09/25/2017 09:17   Dg Chest 2 View  Result Date: 09/23/2017 CLINICAL DATA:  Fall.  Left-sided pain. EXAM: CHEST - 2 VIEW COMPARISON:  09/22/2017. FINDINGS: PowerPort catheter noted with tip at cavoatrial junction  in stable position. Mild cardiomegaly with pulmonary venous congestion, bilateral interstitial prominence, and bilateral pleural effusions, right side greater than left. Findings consistent with CHF. Multiple old right and left-sided rib fractures. IMPRESSION: 1. PowerPort catheter noted with tip over cavoatrial junction in stable position. 2. Cardiomegaly with pulmonary venous congestion, bilateral interstitial prominence and bilateral pleural effusions, right side greater than left. Findings consistent with CHF. Pneumonitis cannot be excluded. 3. Bilateral old rib fractures. No acute displaced rib fracture or pneumothorax. Electronically Signed   By: Marcello Moores  Register   On: 09/23/2017 10:55   Dg Chest 2 View  Result Date: 09/22/2017 CLINICAL DATA:  Fall.  Chest pain.  Recent pneumonia.  Lung cancer. EXAM: CHEST - 2 VIEW COMPARISON:  07/25/2017. FINDINGS: PowerPort catheter with tip over superior vena cava. Heart size stable. No pulmonary venous congestion. Bilateral pulmonary infiltrates/edema, new from prior exam. Progressive bilateral pleural effusions. Atelectatic changes right upper lung. Right upper lung mass obscured by infiltrates and effusion. No acute bony abnormality. Old right rib fractures. IMPRESSION: 1. PowerPort catheter noted with tip over superior vena cava. 2. Bilateral  pulmonary infiltrates/edema, new from prior exam. Progressive bilateral pleural effusions. Atelectatic changes right upper lung. Right upper lung mass obscured by infiltrates and effusion. Electronically Signed   By: Marcello Moores  Register   On: 09/22/2017 07:26   Ct Chest Wo Contrast  Result Date: 09/22/2017 CLINICAL DATA:  Dyspnea, history of non-small cell lung cancer. EXAM: CT CHEST WITHOUT CONTRAST TECHNIQUE: Multidetector CT imaging of the chest was performed following the standard protocol without IV contrast. COMPARISON:  Radiographs of September 22, 2017. CT scan of July 05, 2017. FINDINGS: Cardiovascular: Atherosclerosis of thoracic aorta is noted without aneurysm formation. Dissection cannot be excluded due the lack of intravenous contrast. Normal cardiac size. No pericardial effusion. Coronary artery calcifications are noted. Right internal jugular catheter is noted with tip in SVC. Mediastinum/Nodes: Thyroid gland and esophagus are unremarkable. Precarinal lymph node measuring 9 mm is noted on image number 51. Stable subcarinal lymph node measuring 1 cm is noted on image number 64. Hilar adenopathy noted on prior exam is not well evaluated currently due to lack of intravenous contrast. Lungs/Pleura: Large right pleural effusion is noted which is increased in size compared to prior exam. No pneumothorax is noted. Right upper lobe mass is again noted measuring 4.1 x 2.3 cm which is smaller compared to prior exam. There appears to be crescent-shaped scarring or fibrosis or atelectasis in right upper lobe. 12 x 6 mm subpleural nodule is noted posteriorly in the left lung apex which is not significantly changed compared to prior exam. Probable focal atelectasis or scarring is noted in lateral portion of lingular segment of left upper lobe. Upper Abdomen: Large complex mass containing calcifications involving the pancreatic head is again noted and not significantly changed compared to prior exam. It currently  measures 8.6 x 8.3 cm. Musculoskeletal: No chest wall mass or suspicious bone lesions identified. IMPRESSION: Large right pleural effusion is noted which is increased in size compared to prior exam, with adjacent subsegmental atelectasis. 4.1 x 2.3 cm right upper lobe mass is noted which is smaller compared to prior exam. There is interval development of atelectasis, scarring or fibrosis in the right upper lobe. Stable subcarinal and precarinal adenopathy is noted compared to prior exam. Hilar adenopathy noted on prior exam is not well visualized currently due to lack of intravenous contrast. Stable 12 x 6 mm subpleural nodule is noted posteriorly in the left lung apex. Stable  large complex mass with calcifications is noted in the pancreatic head. Aortic Atherosclerosis (ICD10-I70.0) and Emphysema (ICD10-J43.9). Electronically Signed   By: Marijo Conception, M.D.   On: 09/22/2017 08:32   Nm Pet Image Restag (ps) Skull Base To Thigh  Result Date: 09/27/2017 CLINICAL DATA:  Subsequent treatment strategy for lung cancer. EXAM: NUCLEAR MEDICINE PET SKULL BASE TO THIGH TECHNIQUE: 9.2 mCi F-18 FDG was injected intravenously. Full-ring PET imaging was performed from the skull base to thigh after the radiotracer. CT data was obtained and used for attenuation correction and anatomic localization. Fasting blood glucose: 78 mg/dl COMPARISON:  Multiple exams, including 07/11/2017 FINDINGS: Mediastinal blood pool activity: SUV max 2.8 NECK: No significant abnormal hypermetabolic activity in this region. Incidental CT findings: Atherosclerotic calcification of the common carotid arteries. CHEST: A right upper lobe mass measures 1.3 cm in anterior-posterior dimension (formerly 3.3 cm) with maximum SUV 6.6 (formerly 35.3). Indistinctly marginated right hilar adenopathy with maximum SUV 4.2 (formerly 8.1). A lymph node anterior to the carina measures 0.7 cm in short axis on image 95/3 (formerly 1.0 cm) with maximum SUV 2.9  (formerly 4.0). A pleural metastatic lesion on the right side measures approximately 1.8 cm in thickness (formerly 1.5 cm) with maximum SUV 31.3 (formerly 18.2). Moderate right pleural effusion is likely loculated and is reduced in size from prior. There is some accentuated activity along the anterior margin of the main pulmonary artery without a definite CT correlate, maximum SUV 6.5. 1.1 by 0.6 cm subpleural nodule at the left lung apex noted on recent CT chest is not appreciably hypermetabolic. Incidental CT findings: Coronary, aortic arch, and branch vessel atherosclerotic vascular disease. Right Port-A-Cath tip: Lower SVC. Mild nodularity along the anterior pericardial adipose tissues. Small left pleural effusion with associated passive atelectasis. ABDOMEN/PELVIS: Gastrohepatic mass believed to be arising from the pancreas with central calcification measures 8.4 by 6.8 cm on image 152/3 (formerly 8.3 by 7.1 cm) with maximum SUV 8.2 (formerly 8.0). An adjacent small mass of the left adrenal gland measuring 1.0 by 1.2 cm on image 158/3 has a maximum SUV of 10.0 (formerly 4.6). Activity along the perineum is compatible with mild urinary incontinence. Accentuated activity in the hepatic flexure with maximum SUV 9.2 but without CT correlate, likely physiologic. Incidental CT findings: Aortoiliac atherosclerotic vascular disease. SKELETON: Subacute fractures of the left lateral sixth, seventh, eighth, and ninth ribs with associated low-grade activity compatible with benign rib fractures. Incidental CT findings: Old rib fractures and old left pelvic fractures. IMPRESSION: 1. Prominently reduced size and activity of the dominant right upper lobe mass, with improved hilar and lower paratracheal adenopathy. However, a pleural metastatic lesion on the right side is larger and more hypermetabolic than on the prior exam. 2. Increased activity along a small left adrenal mass. 3. Essentially stable large pancreatic mass with  central calcification,, slowly enlarging over the past several years, and with moderately abnormal accentuated metabolic activity. 4. Subacute left lateral rib fractures. Moderate right and small left pleural effusions. 5. Other imaging findings of potential clinical significance: Aortic Atherosclerosis (ICD10-I70.0). Coronary atherosclerosis. Old rib fractures and old pelvic fractures are also noted. Electronically Signed   By: Van Clines M.D.   On: 09/27/2017 14:55   US Thoracentesis Asp Pleural Space W/img Guide  Result Date: 09/22/2017 CLINICAL DATA:  Right pleural effusion and history of lung carcinoma. EXAM: ULTRASOUND GUIDED RIGHT THORACENTESIS COMPARISON:  CT of the chest on 09/22/2017 PROCEDURE: An ultrasound guided thoracentesis was thoroughly discussed with the patient  and questions answered. The benefits, risks, alternatives and complications were also discussed. The patient understands and wishes to proceed with the procedure. Written consent was obtained. A time-out was performed prior to initiating the procedure. Ultrasound was performed to localize and mark an adequate pocket of fluid in the right chest. The area was then prepped and draped in the normal sterile fashion. 1% Lidocaine was used for local anesthesia. Under ultrasound guidance a 6 French Safe-T-Centesis catheter was introduced. Thoracentesis was performed. The catheter was removed and a dressing applied. COMPLICATIONS: None FINDINGS: A total of approximately 1.3 L of blood tinged fluid was removed. A fluid sample was sent for laboratory analysis. IMPRESSION: Successful ultrasound guided right thoracentesis yielding 1.3 L of pleural fluid. Electronically Signed   By: Aletta Edouard M.D.   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  PLAN:    1.  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 Patient was discussed at cancer conference.  Patient completed her accelerated course of XRT on August 12, 2017 along with her concurrent chemotherapy.  PET scan results from September 27, 2017 reviewed independently and reported as above with significant improvement of the right upper lobe mass and hilar/paratracheal lymphadenopathy.  However pleural metastatic lesion on the right side is larger and more hypermetabolic.  This is also likely the etiology of her recent pleural effusion that was removed with thoracentesis last week.  Patient was given a referral back to radiation oncology for palliative treatment of the right pleural metastatic lesion.  Return to clinic in 1 week for further evaluation and reinitiation of cycle 4 of maintenance Imfinzi. 2.  Shortness of breath: Improved.  Thoracentesis on September 22, 2017 removed 1.3 L fluid.  3.  Poor appetite: Improving.  Continue Megace as prescribed. 4.  Anxiety: Chronic and unchanged.  Continue Xanax as prescribed. 5.  Hyponatremia: Sodium mildly improved to 132.  Monitor.  6.  Anemia: Hemoglobin decreased, but unchanged at 9.5. 7.  Diarrhea: Patient does not complain of this today.  Continue OTC treatments as needed.    Patient expressed understanding and was in agreement with this plan. She also understands that She can call clinic at any time with any questions, concerns, or complaints.   Cancer Staging Squamous cell carcinoma lung, right ([place]) Staging form: Lung, AJCC 8th Edition - Clinical stage from 07/18/2017: Stage IIIA (cT2b, cN2, cM0) - Signed by Lloyd Huger, MD on 07/18/2017   Lloyd Huger, MD   10/01/2017 12:04 PM

## 2017-09-26 NOTE — Clinical Social Work Note (Signed)
Patient is medically ready for discharge to [facility] today. CSW notified Seth Bake at [facility] of discharge. Patient and daughter notified at bedside. Daughter will transport patient to [facility]. RN to call report.   [place], [place]

## 2017-09-26 NOTE — Discharge Summary (Signed)
[facility] NAME: Nicole Arnold    MR#:  [id number]  DATE OF BIRTH:  1928/04/27  DATE OF ADMISSION:  09/22/2017   ADMITTING PHYSICIAN: Gladstone Lighter, MD  DATE OF DISCHARGE:  09/26/17  PRIMARY CARE PHYSICIAN: Venia Carbon, MD   ADMISSION DIAGNOSIS:   Pleural effusion [J90] Hypoxia [R09.02] Community acquired pneumonia, unspecified laterality [J18.9]  DISCHARGE DIAGNOSIS:   Active Problems:   Pneumonia   SECONDARY DIAGNOSIS:   Past Medical History:  Diagnosis Date  . Cataract   . Chronic constipation   . Chronic venous insufficiency   . COPD (chronic obstructive pulmonary disease) ([place])   . Essential hypertension, benign   . GERD (gastroesophageal reflux disease)   . Hypothyroidism   . Mood disorder ([place])    mostly anxiety  . Non-small cell lung cancer with metastasis ([place])   . Osteopenia   . Pancreatic mass   . Pancreatitis since 1961   chronic    HOSPITAL COURSE:   [name] a84 y.o.femalewith a known history of non-small cell lung cancer diagnosed in June 2019 status post chemoradiation and currently on immunotherapy, chronic pancreatitis, hypertension and hypothyroidism presents to hospital secondary to worsening shortness of breath since yesterday.  1. Acute respiratory distress-on presentation, currently not hypoxic and on room air -Secondary to worsening right pleural effusion. s/p right thoracentesis and almost 1.3L bloody fluid drained- cytology negative for any tumor cells - Patient had thoracentesis done twice since her diagnosis of lung cancer in June 2019.  -CT chest this admission showing some right base pneumonia versus atelectasis. Also also moderate to large right pleural effusion.  - Negative MRSA PCR.  -On oral doxycycline - off oxygen -Right lung mass appears smaller than her previous CT. - If recurrent rapid accumulation of right pleural effusion noted-  consider a pleurex catheter  2. Hyponatremia- SIADH. Initially thought to be hypovolemic and received IV fluids, but CXR showing pulmonary vascular congestion and cardiomegaly.  on  Lasix- with improvement  -Echocardiogram with EF of 26%, mild diastolic dysfunction. -Nephrology consulted - on fluid restriction of 1200cc fluids/day and one dose of tolvaptan with improvement of sodium to 132 -   3. Stage III squamous cell carcinoma of right lung-status post radiation and chemotherapy (finished mid July 2019) and currently on immunotherapy every 2 weeks. -Oncology consulted. CT of the chest showing shrinking in the mass size. But recurrent pleural effusions. -Outpatient follow-up recommended - PET scan scheduled for  tomorrow  4. Hypothyroidism-continue Synthroid  5.   Left-sided chest pain-likely musculoskeletal in origin-tender to touch costochondral margin. Seems like rib fracture - but CXR and CT negative. No shingles on exam - Chest x-ray with old bilateral rib fractures. -Lidocaine patch, K pad, and muscle relaxants  as needed   Physicaltherapy consulted- will discharge with home health    DISCHARGE CONDITIONS:   Guarded  CONSULTS OBTAINED:   Treatment Team:  Lloyd Huger, MD Lavonia Dana, MD  DRUG ALLERGIES:   Allergies  Allergen Reactions  . Amoxicillin     Has patient had a PCN reaction causing immediate rash, facial/tongue/throat swelling, SOB or lightheadedness with hypotension: Yes Has patient had a PCN reaction causing severe rash involving mucus membranes or skin necrosis: No Has patient had a PCN reaction that required hospitalization: Unknown Has patient had a PCN reaction occurring within the last 10 years: Unknown If all of the above answers are "NO", then may proceed with Cephalosporin use.   Marland Kitchen  Codeine     vomiting  . Neosporin [Neomycin-Bacitracin Zn-Polymyx]   . Sulfa Antibiotics Rash   DISCHARGE MEDICATIONS:   Allergies as  of 09/26/2017      Reactions   Amoxicillin    Has patient had a PCN reaction causing immediate rash, facial/tongue/throat swelling, SOB or lightheadedness with hypotension: Yes Has patient had a PCN reaction causing severe rash involving mucus membranes or skin necrosis: No Has patient had a PCN reaction that required hospitalization: Unknown Has patient had a PCN reaction occurring within the last 10 years: Unknown If all of the above answers are "NO", then may proceed with Cephalosporin use.   Codeine    vomiting   Neosporin [neomycin-bacitracin Zn-polymyx]    Sulfa Antibiotics Rash      Medication List    TAKE these medications   acetaminophen 325 MG tablet Commonly known as:  TYLENOL Take 650 mg by mouth every 4 (four) hours as needed for mild pain or fever.   ALPRAZolam 0.5 MG tablet Commonly known as:  XANAX Take 0.5 mg by mouth 3 (three) times daily.   alum hydroxide-mag trisilicate 40-98 MG Chew chewable tablet Commonly known as:  GAVISCON Chew 2 tablets by mouth 3 (three) times daily as needed for indigestion or heartburn.   cyclobenzaprine 5 MG tablet Commonly known as:  FLEXERIL Take 1 tablet (5 mg total) by mouth 2 (two) times daily as needed for muscle spasms (muscle pain).   doxycycline 100 MG tablet Commonly known as:  VIBRA-TABS Take 1 tablet (100 mg total) by mouth 2 (two) times daily with a meal for 7 days.   feeding supplement Liqd Take 1 Container by mouth 2 (two) times daily between meals.   furosemide 20 MG tablet Commonly known as:  LASIX Take 1 tablet (20 mg total) by mouth daily. Start taking on:  09/27/2017   lansoprazole 30 MG capsule Commonly known as:  PREVACID Take 30 mg by mouth daily at 12 noon.   levothyroxine 50 MCG tablet Commonly known as:  SYNTHROID, LEVOTHROID Take 50 mcg by mouth daily before breakfast.   lidocaine 5 % Commonly known as:  LIDODERM Place 1 patch onto the skin daily. Remove & Discard patch within 12 hours or as  directed by MD   lidocaine-prilocaine cream Commonly known as:  EMLA Apply 1 application topically as needed.   loperamide 2 MG capsule Commonly known as:  IMODIUM Take 2 capsules (4MG ) by mouth and 1 capsule (2MG ) after each subsequent loose stool - max 8 capsules daily   megestrol 40 MG tablet Commonly known as:  MEGACE Take 1 tablet (40 mg total) by mouth daily.   MICARDIS 40 MG tablet Generic drug:  telmisartan Take 40 mg by mouth daily.   ondansetron 8 MG tablet Commonly known as:  ZOFRAN Take 8 mg by mouth 2 (two) times daily as needed for nausea or vomiting.   PARoxetine 30 MG tablet Commonly known as:  PAXIL Take 30 mg by mouth daily.   polyethylene glycol packet Commonly known as:  MIRALAX / GLYCOLAX Take 17 g by mouth daily as needed for mild constipation.   traMADol 50 MG tablet Commonly known as:  ULTRAM Take 50 mg by mouth every 4 (four) hours as needed for moderate pain.   umeclidinium-vilanterol 62.5-25 MCG/INH Aepb Commonly known as:  ANORO ELLIPTA Inhale 1 puff daily into the lungs.        DISCHARGE INSTRUCTIONS:   1. PCP f/u in 1-2 weeks 2. Oncology f/u  in 3 days 3. Sodium check in 3 days 4. Nephrology f/u in 1 week  DIET:   Cardiac diet  ACTIVITY:   Activity as tolerated  OXYGEN:   Home Oxygen: No.  Oxygen Delivery: room air  DISCHARGE LOCATION:   nursing home   If you experience worsening of your admission symptoms, develop shortness of breath, life threatening emergency, suicidal or homicidal thoughts you must seek medical attention immediately by calling 911 or calling your MD immediately  if symptoms less severe.  You Must read complete instructions/literature along with all the possible adverse reactions/side effects for all the Medicines you take and that have been prescribed to you. Take any new Medicines after you have completely understood and accpet all the possible adverse reactions/side effects.   Please note  You  were cared for by a hospitalist during your hospital stay. If you have any questions about your discharge medications or the care you received while you were in the hospital after you are discharged, you can call the unit and asked to speak with the hospitalist on call if the hospitalist that took care of you is not available. Once you are discharged, your primary care physician will handle any further medical issues. Please note that NO REFILLS for any discharge medications will be authorized once you are discharged, as it is imperative that you return to your primary care physician (or establish a relationship with a primary care physician if you do not have one) for your aftercare needs so that they can reassess your need for medications and monitor your lab values.    On the day of Discharge:  VITAL SIGNS:   Blood pressure 121/64, pulse 81, temperature 97.7 F (36.5 C), temperature source Oral, resp. rate 18, height 5\' 3"  (1.6 m), weight 78.5 kg, SpO2 94 %.  PHYSICAL EXAMINATION:   GENERAL:84 y.o.-year-oldelderlypatient lying in the bed with no acute distress.  EYES: Pupils equal, round, reactive to light and accommodation. No scleral icterus. Extraocular muscles intact.  HEENT: Head atraumatic, normocephalic. Oropharynx and nasopharynx clear.  NECK: Supple, no jugular venous distention. No thyroid enlargement, no tenderness.  LUNGS: Normal breath sounds bilaterally, no wheezing, rhonchi or crepitation.Decreased right basilar breath sounds and some rales at the bases No use of accessory muscles of respiration.  CARDIOVASCULAR: S1, S2 normal. No rubs, or gallops. 2/6 systolic murmur present Tender to touch along the left costal margin ABDOMEN: Soft, nontender, nondistended. Bowel sounds present. No organomegaly or mass.  EXTREMITIES: No pedal edema, cyanosis, or clubbing.  NEUROLOGIC: Cranial nerves II through XII are intact. Muscle strength 5/5 in all extremities. Sensation intact.  Gait not checked.  PSYCHIATRIC: The patient is alert and oriented x 3.  SKIN: No obvious rash, lesion, or ulcer. Marland Kitchen   DATA REVIEW:   CBC Recent Labs  Lab 09/23/17 0551  WBC 8.3  HGB 9.4*  HCT 26.9*  PLT 441*    Chemistries  Recent Labs  Lab 09/22/17 0621  09/26/17 0617  NA 124*   < > 132*  K 4.2   < > 4.1  CL 93*   < > 100  CO2 20*   < > 24  GLUCOSE 89   < > 88  BUN 15   < > 21  CREATININE 0.74   < > 0.84  CALCIUM 8.4*   < > 8.3*  AST 16  --   --   ALT 11  --   --   ALKPHOS 52  --   --  BILITOT 0.6  --   --    < > = values in this interval not displayed.     Microbiology Results  Results for orders placed or performed during the hospital encounter of 09/22/17  Culture, blood (routine x 2) Call MD if unable to obtain prior to antibiotics being given     Status: None (Preliminary result)   Collection Time: 09/22/17  9:24 AM  Result Value Ref Range Status   Specimen Description BLOOD RIGHT HAND  Final   Special Requests   Final    BOTTLES DRAWN AEROBIC AND ANAEROBIC Blood Culture results may not be optimal due to an inadequate volume of blood received in culture bottles   Culture   Final    NO GROWTH 4 DAYS Performed at [facility], [address], [place], [place] [address]    Report Status PENDING  Incomplete  Culture, blood (routine x 2) Call MD if unable to obtain prior to antibiotics being given     Status: None (Preliminary result)   Collection Time: 09/22/17  9:24 AM  Result Value Ref Range Status   Specimen Description BLOOD LEFT HAND  Final   Special Requests   Final    BOTTLES DRAWN AEROBIC AND ANAEROBIC Blood Culture adequate volume   Culture   Final    NO GROWTH 4 DAYS Performed at [facility], [address] [place], [place] [address]    Report Status PENDING  Incomplete  MRSA PCR Screening     Status: None   Collection Time: 09/22/17 10:05 AM  Result Value Ref Range Status   MRSA by PCR NEGATIVE NEGATIVE Final     Comment:        The GeneXpert MRSA Assay (FDA approved for NASAL specimens only), is one component of a comprehensive MRSA colonization surveillance program. It is not intended to diagnose MRSA infection nor to guide or monitor treatment for MRSA infections. Performed at [facility], [address] [place], [place] [address]   Body fluid culture     Status: None   Collection Time: 09/22/17 11:20 AM  Result Value Ref Range Status   Specimen Description   Final    PLEURAL Performed at [facility], [address] [place], [place] [address]    Special Requests   Final    PLEURAL Performed at [facility], [place], [place] [address]    Gram Stain NO WBC SEEN NO ORGANISMS SEEN   Final   Culture   Final    NO GROWTH 3 DAYS Performed at [facility], [place] [address], [place], [place] [address]    Report Status 09/25/2017 FINAL  Final    RADIOLOGY:  No results found.   Management plans discussed with the patient, family and they are in agreement.  CODE STATUS:     Code Status Orders  (From admission, onward)         Start     Ordered   09/22/17 1048  Do not attempt resuscitation (DNR)  Continuous    Question Answer Comment  In the event of cardiac or respiratory ARREST Do not call a "code blue"   In the event of cardiac or respiratory ARREST Do not perform Intubation, CPR, defibrillation or ACLS   In the event of cardiac or respiratory ARREST Use medication by any route, position, wound care, and other measures to relive pain and suffering. May use oxygen, suction and manual treatment of airway  obstruction as needed for comfort.      09/22/17 1047        Code Status History    Date Active Date Inactive Code Status Order ID Comments User Context   09/22/2017 0843 09/22/2017 1047 Full Code [id number]  Gladstone Lighter, MD Inpatient   07/05/2017 2037 07/08/2017 1744 Full Code [id number]  Nicholes Mango, MD Inpatient      Advance Directive Documentation     Most Recent Value  Type of Advance Directive  Healthcare Power of [place], Living will, Out of facility DNR (pink MOST or yellow form)  Pre-existing out of facility DNR order (yellow form or pink MOST form)  Yellow form placed in chart (order not valid for inpatient use)  "MOST" Form in Place?  -      TOTAL TIME TAKING CARE OF THIS PATIENT: 38 minutes.    Gladstone Lighter M.D on 09/26/2017 at 11:33 AM  Between 7am to 6pm - Pager - [phone]  After 6pm go to [email or web address] - Proofreader  [facility]  Office  [phone]  CC: Primary care physician; Venia Carbon, MD   Note: This dictation was prepared with Dragon dictation along with smaller phrase technology. Any transcriptional errors that result from this process are unintentional.

## 2017-09-26 NOTE — Discharge Instructions (Signed)
1. Fluid restriction of 1200-1400cc/day 2. Heating pad as needed to left chest

## 2017-09-26 NOTE — Progress Notes (Signed)
[facility]  ROUNDING NOTE   Subjective:   Family at bedside, daughter and son-in-law who is a Industrial/product designer.   Na 132  Tolvaptan 15mg  administered yesterday   Objective:  Vital signs in last 24 hours:  Temp:  [97.7 F (36.5 C)-98.6 F (37 C)] 97.7 F (36.5 C) (09/02 0414) Pulse Rate:  [81-95] 81 (09/02 0414) Resp:  [16-18] 18 (09/02 0414) BP: (121-152)/(63-64) 121/64 (09/02 0414) SpO2:  [94 %-98 %] 94 % (09/02 0414)  Weight change:  Filed Weights   09/22/17 0626 09/22/17 1123  Weight: 79.8 kg 78.5 kg    Intake/Output: I/O last 3 completed shifts: In: 600 [P.O.:600] Out: -    Intake/Output this shift:  Total I/O In: 360 [P.O.:360] Out: -   Physical Exam: General: NAD,   Head: Normocephalic, atraumatic. Moist oral mucosal membranes  Eyes: Anicteric, PERRL  Neck: Supple, trachea midline  Lungs:  Clear to auscultation  Heart: Regular rate and rhythm  Abdomen:  Soft, nontender,   Extremities:  no peripheral edema.  Neurologic: Nonfocal, moving all four extremities  Skin: No lesions        Basic Metabolic Panel: Recent Labs  Lab 09/23/17 0551 09/24/17 0500 09/25/17 0358 09/25/17 1058 09/25/17 1758 09/26/17 0156 09/26/17 0617  NA 125* 124* 125* 123* 126* 131* 132*  K 4.1 3.8 4.5 4.0  --   --  4.1  CL 98 94* 94* 92*  --   --  100  CO2 23 24 24 22   --   --  24  GLUCOSE 82 87 89 115*  --   --  88  BUN 11 15 23 20   --   --  21  CREATININE 0.65 0.79 0.92 0.87  --   --  0.84  CALCIUM 8.1* 8.1* 8.2* 7.9*  --   --  8.3*    Liver Function Tests: Recent Labs  Lab 09/22/17 0621  AST 16  ALT 11  ALKPHOS 52  BILITOT 0.6  PROT 7.0  ALBUMIN 2.9*   No results for input(s): LIPASE, AMYLASE in the last 168 hours. No results for input(s): AMMONIA in the last 168 hours.  CBC: Recent Labs  Lab 09/22/17 0621 09/23/17 0551  WBC 9.4 8.3  HGB 10.0* 9.4*  HCT 27.8* 26.9*  MCV 83.1 84.2  PLT 451* 441*    Cardiac Enzymes: Recent Labs  Lab  09/22/17 0621  TROPONINI <0.03    BNP: Invalid input(s): POCBNP  CBG: No results for input(s): GLUCAP in the last 168 hours.  Microbiology: Results for orders placed or performed during the hospital encounter of 09/22/17  Culture, blood (routine x 2) Call MD if unable to obtain prior to antibiotics being given     Status: None (Preliminary result)   Collection Time: 09/22/17  9:24 AM  Result Value Ref Range Status   Specimen Description BLOOD RIGHT HAND  Final   Special Requests   Final    BOTTLES DRAWN AEROBIC AND ANAEROBIC Blood Culture results may not be optimal due to an inadequate volume of blood received in culture bottles   Culture   Final    NO GROWTH 4 DAYS Performed at [facility], [address], [place], [place] [address]    Report Status PENDING  Incomplete  Culture, blood (routine x 2) Call MD if unable to obtain prior to antibiotics being given     Status: None (Preliminary result)   Collection Time: 09/22/17  9:24 AM  Result Value Ref Range Status  Specimen Description BLOOD LEFT HAND  Final   Special Requests   Final    BOTTLES DRAWN AEROBIC AND ANAEROBIC Blood Culture adequate volume   Culture   Final    NO GROWTH 4 DAYS Performed at [facility], [address], [place], [place] [address]    Report Status PENDING  Incomplete  MRSA PCR Screening     Status: None   Collection Time: 09/22/17 10:05 AM  Result Value Ref Range Status   MRSA by PCR NEGATIVE NEGATIVE Final    Comment:        The GeneXpert MRSA Assay (FDA approved for NASAL specimens only), is one component of a comprehensive MRSA colonization surveillance program. It is not intended to diagnose MRSA infection nor to guide or monitor treatment for MRSA infections. Performed at [facility], [address] [place], [place] [address]   Body fluid culture     Status: None   Collection Time: 09/22/17 11:20 AM  Result Value Ref Range Status   Specimen  Description   Final    PLEURAL Performed at [facility], [address] [place], [place] [address]    Special Requests   Final    PLEURAL Performed at [facility], [place], [place] [address]    Gram Stain NO WBC SEEN NO ORGANISMS SEEN   Final   Culture   Final    NO GROWTH 3 DAYS Performed at [facility], [place] [address], [place], [place] [address]    Report Status 09/25/2017 FINAL  Final    Coagulation Studies: No results for input(s): LABPROT, INR in the last 72 hours.  Urinalysis: No results for input(s): COLORURINE, LABSPEC, PHURINE, GLUCOSEU, HGBUR, BILIRUBINUR, KETONESUR, PROTEINUR, UROBILINOGEN, NITRITE, LEUKOCYTESUR in the last 72 hours.  Invalid input(s): APPERANCEUR    Imaging: Dg Chest 2 View  Result Date: 09/25/2017 CLINICAL DATA:  84 year old female currently admitted for congestive heart failure. Clinical history includes stage III squamous cell carcinoma of the right lung status post radiation and chemotherapy completed in July 2019 currently on every other week immunotherapy. EXAM: CHEST - 2 VIEW COMPARISON:  Prior chest x-ray 09/23/2017 FINDINGS: Stable right IJ approach single-lumen power injectable port catheter with the tip overlying the cavoatrial junction. The cardiac and mediastinal contours remain unchanged in within normal limits. Atherosclerotic calcifications are present in the transverse aorta. Moderate right pleural effusion with some fluid trapped in the major and minor fissures. There may also be a small left pleural effusion. Extensive background pulmonary parenchymal changes including chronic bronchitic changes and emphysema. Bibasilar atelectasis. Improved pulmonary vascular congestion. No overt pulmonary edema. No pneumothorax. No acute osseous abnormality. IMPRESSION: 1. Interval resolution of pulmonary vascular congestion. 2. Persistent moderate layering right pleural effusion with fluid in the major and  minor fissures. 3. Advanced bilateral chronic bronchitic changes and emphysema. 4. Bibasilar atelectasis and scarring. 5.  Aortic Atherosclerosis (ICD10-170.0) Electronically Signed   By: Jacqulynn Cadet M.D.   On: 09/25/2017 09:17     Medications:    . ALPRAZolam  0.5 mg Oral TID  . cyclobenzaprine  5 mg Oral BID  . docusate sodium  100 mg Oral BID  . doxycycline  100 mg Oral BID WC  . enoxaparin (LOVENOX) injection  40 mg Subcutaneous Q24H  . furosemide  20 mg Oral Daily  . irbesartan  150 mg Oral Daily  . levothyroxine  50 mcg Oral QAC breakfast  . lidocaine  1 patch Transdermal Q24H  .  megestrol  40 mg Oral Daily  . multivitamin with minerals  1 tablet Oral Daily  . pantoprazole  40 mg Oral Daily  . PARoxetine  30 mg Oral Daily  . protein supplement shake  11 oz Oral BID BM  . umeclidinium-vilanterol  1 puff Inhalation Daily   acetaminophen **OR** acetaminophen, chlorpheniramine-HYDROcodone, ketorolac, ondansetron **OR** ondansetron (ZOFRAN) IV, polyethylene glycol, traMADol  Assessment/ Plan:  Nicole Arnold is a 84 y.o. white female with with lung cancer nonsmall cell, hypothyroidism, GERD, hypertension, COPD, who was admitted to [facility] on 09/22/2017    1. Hyponatremia: acute on chronic. Baseline 127-133 for last 1 year tolvaptan x 1 dose on 9/1 - Continue fluid restriction  - Started on furosemide this admission.  - Continue to monitor serum sodium levels.   2. Hypertension: home regimen of olmesartan, currently changed to irbesartan.  Blood pressure at goal.  - Furosemide as above.    LOS: 4 Shekera Beavers 9/2/201911:16 AM

## 2017-09-27 ENCOUNTER — Ambulatory Visit: Payer: Medicare Other | Admitting: Radiation Oncology

## 2017-09-27 ENCOUNTER — Ambulatory Visit
Admission: RE | Admit: 2017-09-27 | Discharge: 2017-09-27 | Disposition: A | Discharge status: Home / Self Care | Payer: No Typology Code available for payment source | Source: Ambulatory Visit | Attending: Oncology | Admitting: Oncology

## 2017-09-27 ENCOUNTER — Telehealth: Payer: Self-pay | Admitting: *Deleted

## 2017-09-27 DIAGNOSIS — R599 Enlarged lymph nodes, unspecified: Secondary | ICD-10-CM | POA: Insufficient documentation

## 2017-09-27 DIAGNOSIS — S2242XA Multiple fractures of ribs, left side, initial encounter for closed fracture: Secondary | ICD-10-CM | POA: Diagnosis not present

## 2017-09-27 DIAGNOSIS — E279 Disorder of adrenal gland, unspecified: Secondary | ICD-10-CM | POA: Diagnosis not present

## 2017-09-27 DIAGNOSIS — B958 Unspecified staphylococcus as the cause of diseases classified elsewhere: Secondary | ICD-10-CM | POA: Diagnosis not present

## 2017-09-27 DIAGNOSIS — S3289XS Fracture of other parts of pelvis, sequela: Secondary | ICD-10-CM | POA: Diagnosis not present

## 2017-09-27 DIAGNOSIS — C3490 Malignant neoplasm of unspecified part of unspecified bronchus or lung: Secondary | ICD-10-CM

## 2017-09-27 DIAGNOSIS — C3491 Malignant neoplasm of unspecified part of right bronchus or lung: Secondary | ICD-10-CM | POA: Diagnosis present

## 2017-09-27 DIAGNOSIS — I700 Atherosclerosis of aorta: Secondary | ICD-10-CM | POA: Insufficient documentation

## 2017-09-27 DIAGNOSIS — J181 Lobar pneumonia, unspecified organism: Secondary | ICD-10-CM | POA: Diagnosis not present

## 2017-09-27 DIAGNOSIS — I2510 Atherosclerotic heart disease of native coronary artery without angina pectoris: Secondary | ICD-10-CM | POA: Insufficient documentation

## 2017-09-27 DIAGNOSIS — J90 Pleural effusion, not elsewhere classified: Secondary | ICD-10-CM | POA: Diagnosis not present

## 2017-09-27 LAB — CULTURE, BLOOD (ROUTINE X 2)
CULTURE: NO GROWTH
CULTURE: NO GROWTH
Special Requests: ADEQUATE

## 2017-09-27 LAB — GLUCOSE, CAPILLARY: GLUCOSE-CAPILLARY: 78 mg/dL (ref 70–99)

## 2017-09-27 MED ORDER — FLUDEOXYGLUCOSE F - 18 (FDG) INJECTION
9.2200 | Freq: Once | INTRAVENOUS | Status: AC | PRN
  Administered 2017-09-27: 9.22 via INTRAVENOUS

## 2017-09-27 NOTE — Telephone Encounter (Signed)
Lm requesting return call to complete TCM and confirm hosp f/u appt  

## 2017-09-28 NOTE — Telephone Encounter (Signed)
Per pts husband, pt is currently at [facility] and has been seeing Dr. Silvio Pate, at that location

## 2017-09-28 NOTE — Telephone Encounter (Signed)
Yes---I already did her hospital follow up yesterday

## 2017-09-29 ENCOUNTER — Inpatient Hospital Stay: Payer: Medicare Other | Attending: Oncology | Admitting: Oncology

## 2017-09-29 ENCOUNTER — Inpatient Hospital Stay: Payer: Medicare Other

## 2017-09-29 ENCOUNTER — Other Ambulatory Visit: Payer: Self-pay | Admitting: *Deleted

## 2017-09-29 ENCOUNTER — Other Ambulatory Visit: Payer: Self-pay

## 2017-09-29 VITALS — BP 118/63 | HR 87 | Temp 98.0°F | Resp 18 | Wt 170.5 lb

## 2017-09-29 DIAGNOSIS — I872 Venous insufficiency (chronic) (peripheral): Secondary | ICD-10-CM | POA: Insufficient documentation

## 2017-09-29 DIAGNOSIS — R531 Weakness: Secondary | ICD-10-CM | POA: Diagnosis not present

## 2017-09-29 DIAGNOSIS — E871 Hypo-osmolality and hyponatremia: Secondary | ICD-10-CM | POA: Insufficient documentation

## 2017-09-29 DIAGNOSIS — J449 Chronic obstructive pulmonary disease, unspecified: Secondary | ICD-10-CM | POA: Insufficient documentation

## 2017-09-29 DIAGNOSIS — R5383 Other fatigue: Secondary | ICD-10-CM | POA: Diagnosis not present

## 2017-09-29 DIAGNOSIS — C3411 Malignant neoplasm of upper lobe, right bronchus or lung: Secondary | ICD-10-CM | POA: Insufficient documentation

## 2017-09-29 DIAGNOSIS — R21 Rash and other nonspecific skin eruption: Secondary | ICD-10-CM | POA: Diagnosis not present

## 2017-09-29 DIAGNOSIS — C3491 Malignant neoplasm of unspecified part of right bronchus or lung: Secondary | ICD-10-CM

## 2017-09-29 DIAGNOSIS — Z803 Family history of malignant neoplasm of breast: Secondary | ICD-10-CM | POA: Insufficient documentation

## 2017-09-29 DIAGNOSIS — K219 Gastro-esophageal reflux disease without esophagitis: Secondary | ICD-10-CM | POA: Diagnosis not present

## 2017-09-29 DIAGNOSIS — I700 Atherosclerosis of aorta: Secondary | ICD-10-CM | POA: Insufficient documentation

## 2017-09-29 DIAGNOSIS — R413 Other amnesia: Secondary | ICD-10-CM | POA: Insufficient documentation

## 2017-09-29 DIAGNOSIS — K5909 Other constipation: Secondary | ICD-10-CM | POA: Insufficient documentation

## 2017-09-29 DIAGNOSIS — R05 Cough: Secondary | ICD-10-CM | POA: Diagnosis not present

## 2017-09-29 DIAGNOSIS — Z801 Family history of malignant neoplasm of trachea, bronchus and lung: Secondary | ICD-10-CM | POA: Insufficient documentation

## 2017-09-29 DIAGNOSIS — F419 Anxiety disorder, unspecified: Secondary | ICD-10-CM

## 2017-09-29 DIAGNOSIS — R079 Chest pain, unspecified: Secondary | ICD-10-CM | POA: Insufficient documentation

## 2017-09-29 DIAGNOSIS — Z5112 Encounter for antineoplastic immunotherapy: Secondary | ICD-10-CM | POA: Insufficient documentation

## 2017-09-29 DIAGNOSIS — D509 Iron deficiency anemia, unspecified: Secondary | ICD-10-CM | POA: Insufficient documentation

## 2017-09-29 DIAGNOSIS — J9811 Atelectasis: Secondary | ICD-10-CM | POA: Insufficient documentation

## 2017-09-29 DIAGNOSIS — R0602 Shortness of breath: Secondary | ICD-10-CM | POA: Insufficient documentation

## 2017-09-29 DIAGNOSIS — E039 Hypothyroidism, unspecified: Secondary | ICD-10-CM | POA: Diagnosis not present

## 2017-09-29 DIAGNOSIS — M8580 Other specified disorders of bone density and structure, unspecified site: Secondary | ICD-10-CM | POA: Diagnosis not present

## 2017-09-29 DIAGNOSIS — Z87891 Personal history of nicotine dependence: Secondary | ICD-10-CM

## 2017-09-29 DIAGNOSIS — R630 Anorexia: Secondary | ICD-10-CM | POA: Insufficient documentation

## 2017-09-29 DIAGNOSIS — I10 Essential (primary) hypertension: Secondary | ICD-10-CM | POA: Diagnosis not present

## 2017-09-29 DIAGNOSIS — J90 Pleural effusion, not elsewhere classified: Secondary | ICD-10-CM | POA: Insufficient documentation

## 2017-09-29 DIAGNOSIS — K861 Other chronic pancreatitis: Secondary | ICD-10-CM | POA: Diagnosis not present

## 2017-09-29 DIAGNOSIS — Z79899 Other long term (current) drug therapy: Secondary | ICD-10-CM | POA: Insufficient documentation

## 2017-09-29 LAB — CBC WITH DIFFERENTIAL/PLATELET
BASOS PCT: 0 %
Basophils Absolute: 0.1 10*3/uL (ref 0–0.1)
Eosinophils Absolute: 0.5 10*3/uL (ref 0–0.7)
Eosinophils Relative: 4 %
HEMATOCRIT: 28.7 % — AB (ref 35.0–47.0)
HEMOGLOBIN: 9.5 g/dL — AB (ref 12.0–16.0)
Lymphocytes Relative: 5 %
Lymphs Abs: 0.6 10*3/uL — ABNORMAL LOW (ref 1.0–3.6)
MCH: 28.3 pg (ref 26.0–34.0)
MCHC: 33.2 g/dL (ref 32.0–36.0)
MCV: 85.2 fL (ref 80.0–100.0)
MONOS PCT: 10 %
Monocytes Absolute: 1.3 10*3/uL — ABNORMAL HIGH (ref 0.2–0.9)
NEUTROS ABS: 10.5 10*3/uL — AB (ref 1.4–6.5)
NEUTROS PCT: 81 %
PLATELETS: 589 10*3/uL — AB (ref 150–440)
RBC: 3.37 MIL/uL — AB (ref 3.80–5.20)
RDW: 22.6 % — ABNORMAL HIGH (ref 11.5–14.5)
WBC: 13 10*3/uL — AB (ref 3.6–11.0)

## 2017-09-29 LAB — COMPREHENSIVE METABOLIC PANEL
ALT: 16 U/L (ref 0–44)
ANION GAP: 9 (ref 5–15)
AST: 19 U/L (ref 15–41)
Albumin: 3 g/dL — ABNORMAL LOW (ref 3.5–5.0)
Alkaline Phosphatase: 60 U/L (ref 38–126)
BUN: 34 mg/dL — ABNORMAL HIGH (ref 8–23)
CALCIUM: 8.7 mg/dL — AB (ref 8.9–10.3)
CHLORIDE: 102 mmol/L (ref 98–111)
CO2: 21 mmol/L — ABNORMAL LOW (ref 22–32)
Creatinine, Ser: 1.09 mg/dL — ABNORMAL HIGH (ref 0.44–1.00)
GFR, EST AFRICAN AMERICAN: 51 mL/min — AB (ref >60)
GFR, EST NON AFRICAN AMERICAN: 44 mL/min — AB (ref >60)
Glucose, Bld: 97 mg/dL (ref 70–99)
Potassium: 4 mmol/L (ref 3.5–5.1)
Sodium: 132 mmol/L — ABNORMAL LOW (ref 135–145)
Total Bilirubin: 0.5 mg/dL (ref 0.3–1.2)
Total Protein: 7.2 g/dL (ref 6.5–8.1)

## 2017-09-29 MED ORDER — SODIUM CHLORIDE 0.9% FLUSH
10.0000 mL | Freq: Once | INTRAVENOUS | Status: AC
  Administered 2017-09-29: 10 mL via INTRAVENOUS
  Filled 2017-09-29: qty 10

## 2017-09-29 MED ORDER — HEPARIN SOD (PORK) LOCK FLUSH 100 UNIT/ML IV SOLN
500.0000 [IU] | Freq: Once | INTRAVENOUS | Status: AC
  Administered 2017-09-29: 500 [IU] via INTRAVENOUS
  Filled 2017-09-29: qty 5

## 2017-09-29 NOTE — Progress Notes (Signed)
Dr Grayland Ormond assessed pts port area, appears to be irritated from adhesive, educated pt and her daughter on home care; keep area clean and dry, can apply hydrocortisone cream for itching or Aquaphor lotion prn, no further questions at this time.

## 2017-09-29 NOTE — Progress Notes (Signed)
Here w family  Stated that she has been having " itching " on lower back- has steroid cream that is effective. Also stated that port area " itches " .

## 2017-10-03 ENCOUNTER — Ambulatory Visit (INDEPENDENT_AMBULATORY_CARE_PROVIDER_SITE_OTHER): Payer: Medicare Other | Admitting: Vascular Surgery

## 2017-10-03 NOTE — Progress Notes (Signed)
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  INTERVAL HISTORY: Patient returns to clinic today for further evaluation and reinitiation of maintenance Imfinzi.  She continues to have weakness and fatigue, but states this is improving.  She reports redness and itching at her port site as well as a rash on her torso.  She does not complain of shortness of breath today. She has no neurologic complaints.  She denies any fevers.  She denies any chest pain, hemoptysis, or cough.  She has a poor appetite, but states this is improving. She denies any nausea, vomiting, constipation, or diarrhea. She has no urinary complaints.  Patient offers no further specific complaints today.  REVIEW OF SYSTEMS:   Review of Systems  Constitutional: Positive for malaise/fatigue. Negative for fever and weight loss.  Respiratory: Negative.  Negative for cough and shortness of breath.   Cardiovascular: Negative.  Negative for chest pain and leg swelling.  Gastrointestinal: Negative for abdominal pain, constipation and diarrhea.  Genitourinary: Negative.  Negative for dysuria.  Musculoskeletal: Negative.  Negative for back pain.  Skin: Positive for itching and rash.  Neurological: Positive for weakness. Negative for sensory change, focal weakness and headaches.  Psychiatric/Behavioral: Negative.  Negative for depression. The patient is not nervous/anxious.     As per HPI. Otherwise, a complete review of systems is negative.  PAST MEDICAL HISTORY: Past Medical History:  Diagnosis Date  . Cataract   . Chronic constipation   . Chronic venous insufficiency   . COPD (chronic obstructive pulmonary disease)  ([place])   . Essential hypertension, benign   . GERD (gastroesophageal reflux disease)   . Hypothyroidism   . Mood disorder ([place])    mostly anxiety  . Non-small cell lung cancer with metastasis ([place])   . Osteopenia   . Pancreatic mass   . Pancreatitis since 1961   chronic    PAST SURGICAL HISTORY: Past Surgical History:  Procedure Laterality Date  . APPENDECTOMY  1949  . CATARACT EXTRACTION Right 1987  . CATARACT EXTRACTION Left 1986  . detached retina repair Right 1991  . OOPHORECTOMY  1959   right ovary, part of left  . PORTA CATH INSERTION N/A 07/18/2017   Procedure: PORTA CATH INSERTION;  Surgeon: Algernon Huxley, MD;  Location: [facility];  Service: Cardiovascular;  Laterality: N/A;  . RETINAL DETACHMENT SURGERY Left 1997  . THORACOTOMY Left 2008   due to pneumonia  . uterine hysterectomy  1978    FAMILY HISTORY: Family History  Problem Relation Age of Onset  . Lung cancer Sister   . Breast cancer Neg Hx     ADVANCED DIRECTIVES (Y/N):  N  HEALTH MAINTENANCE: Social History   Tobacco Use  . Smoking status: Former Smoker    Packs/day: 0.50    Years: 30.00    Pack years: 15.00    Types: Cigarettes    Last attempt to quit: 01/25/1993    Years since quitting: 24.7  . Smokeless tobacco: Never Used  Substance Use Topics  . Alcohol use: No  . Drug use: No     Colonoscopy:  PAP:  Bone density:  Lipid panel:  Allergies  Allergen Reactions  . Amoxicillin  Has patient had a PCN reaction causing immediate rash, facial/tongue/throat swelling, SOB or lightheadedness with hypotension: Yes Has patient had a PCN reaction causing severe rash involving mucus membranes or skin necrosis: No Has patient had a PCN reaction that required hospitalization: Unknown Has patient had a PCN reaction occurring within the last 10 years: Unknown If all of the above answers are "NO", then may proceed with Cephalosporin use.   . Codeine     vomiting  . Neosporin  [Neomycin-Bacitracin Zn-Polymyx]   . Sulfa Antibiotics Rash    Current Outpatient Medications  Medication Sig Dispense Refill  . acetaminophen (TYLENOL) 325 MG tablet Take 650 mg by mouth every 4 (four) hours as needed for mild pain or fever.     . ALPRAZolam (XANAX) 0.5 MG tablet Take 0.5 mg by mouth 3 (three) times daily.     Marland Kitchen alum hydroxide-mag trisilicate (GAVISCON) 83-41 MG CHEW chewable tablet Chew 2 tablets by mouth 3 (three) times daily as needed for indigestion or heartburn.    . cyclobenzaprine (FLEXERIL) 5 MG tablet Take 1 tablet (5 mg total) by mouth 2 (two) times daily as needed for muscle spasms (muscle pain). 30 tablet 0  . feeding supplement (BOOST HIGH PROTEIN) LIQD Take 1 Container by mouth 2 (two) times daily between meals.    . furosemide (LASIX) 20 MG tablet Take 1 tablet (20 mg total) by mouth daily. 30 tablet 1  . hydrocortisone cream 1 % Apply 1 application topically 2 (two) times daily.    . lansoprazole (PREVACID) 30 MG capsule Take 30 mg by mouth daily at 12 noon.    Marland Kitchen levothyroxine (SYNTHROID, LEVOTHROID) 50 MCG tablet Take 50 mcg by mouth daily before breakfast.    . lidocaine (LIDODERM) 5 % Place 1 patch onto the skin daily. Remove & Discard patch within 12 hours or as directed by MD 15 patch 0  . lidocaine-prilocaine (EMLA) cream Apply 1 application topically as needed.    . loperamide (IMODIUM) 2 MG capsule Take 2 capsules (4MG) by mouth and 1 capsule (2MG) after each subsequent loose stool - max 8 capsules daily    . megestrol (MEGACE) 40 MG tablet Take 1 tablet (40 mg total) by mouth daily. 30 tablet 0  . MICARDIS 40 MG tablet Take 40 mg by mouth daily.    . ondansetron (ZOFRAN) 8 MG tablet Take 8 mg by mouth 2 (two) times daily as needed for nausea or vomiting.    Marland Kitchen PARoxetine (PAXIL) 30 MG tablet Take 30 mg by mouth daily.     . polyethylene glycol (MIRALAX / GLYCOLAX) packet Take 17 g by mouth daily as needed for mild constipation. 14 each 0  . traMADol  (ULTRAM) 50 MG tablet Take 50 mg by mouth every 4 (four) hours as needed for moderate pain.     Marland Kitchen triamcinolone cream (KENALOG) 0.1 % Apply 1 application topically 3 (three) times daily.    Marland Kitchen umeclidinium-vilanterol (ANORO ELLIPTA) 62.5-25 MCG/INH AEPB Inhale 1 puff daily into the lungs. 180 each 3  . nitrofurantoin, macrocrystal-monohydrate, (MACROBID) 100 MG capsule Take 1 capsule (100 mg total) by mouth 2 (two) times daily for 7 days. 14 capsule 0  . predniSONE (STERAPRED UNI-PAK 21 TAB) 10 MG (21) TBPK tablet Taper as directed 21 tablet 0   No current facility-administered medications for this visit.    Facility-Administered Medications Ordered in Other Visits  Medication Dose Route Frequency Provider Last Rate Last Dose  . heparin lock flush 100 unit/mL  500 Units Intravenous Once Lloyd Huger, MD        OBJECTIVE: There were no vitals filed for this visit.   There is no height or weight on file to calculate BMI.    ECOG FS:2 - Symptomatic, <50% confined to bed   General: Well-developed, well-nourished, no acute distress.  Sitting in a wheelchair. Eyes: Pink conjunctiva, anicteric sclera. HEENT: Normocephalic, moist mucous membranes. Chest wall: Port in place with mild erythema surrounding. Lungs: Clear to auscultation bilaterally. Heart: Regular rate and rhythm. No rubs, murmurs, or gallops. Abdomen: Soft, nontender, nondistended. No organomegaly noted, normoactive bowel sounds. Musculoskeletal: No edema, cyanosis, or clubbing. Neuro: Alert, answering all questions appropriately. Cranial nerves grossly intact. Skin: Mild maculopapular rash noted on back. Psych: Normal affect.   LAB RESULTS:  Lab Results  Component Value Date   NA 132 (L) 10/06/2017   K 4.0 10/06/2017   CL 102 10/06/2017   CO2 22 10/06/2017   GLUCOSE 102 (H) 10/06/2017   BUN 24 (H) 10/06/2017   CREATININE 1.02 (H) 10/06/2017   CALCIUM 9.1 10/06/2017   PROT 7.4 10/06/2017   ALBUMIN 3.2 (L)  10/06/2017   AST 16 10/06/2017   ALT 10 10/06/2017   ALKPHOS 68 10/06/2017   BILITOT 0.6 10/06/2017   GFRNONAA 48 (L) 10/06/2017   GFRAA 56 (L) 10/06/2017    Lab Results  Component Value Date   WBC 9.4 10/06/2017   NEUTROABS 7.2 (H) 10/06/2017   HGB 9.6 (L) 10/06/2017   HCT 28.2 (L) 10/06/2017   MCV 84.4 10/06/2017   PLT 508 (H) 10/06/2017     STUDIES: Dg Chest 1 View  Result Date: 09/22/2017 CLINICAL DATA:  Status post right thoracentesis. EXAM: CHEST  1 VIEW COMPARISON:  09/22/2017 FINDINGS: The heart size and mediastinal contours are within normal limits. After thoracentesis, there is significant reduction in pleural fluid volume and improved aeration of the right lung. A small amount of basilar pleural fluid remains. No pneumothorax is identified. There is no evidence of pulmonary edema, airspace consolidation or focal nodule. The visualized skeletal structures are unremarkable. IMPRESSION: Significant reduction in right pleural fluid volume after thoracentesis. No pneumothorax identified. Electronically Signed   By: Aletta Edouard M.D.   On: 09/22/2017 13:42   Dg Chest 2 View  Result Date: 09/25/2017 CLINICAL DATA:  84 year old female currently admitted for congestive heart failure. Clinical history includes stage III squamous cell carcinoma of the right lung status post radiation and chemotherapy completed in July 2019 currently on every other week immunotherapy. EXAM: CHEST - 2 VIEW COMPARISON:  Prior chest x-ray 09/23/2017 FINDINGS: Stable right IJ approach single-lumen power injectable port catheter with the tip overlying the cavoatrial junction. The cardiac and mediastinal contours remain unchanged in within normal limits. Atherosclerotic calcifications are present in the transverse aorta. Moderate right pleural effusion with some fluid trapped in the major and minor fissures. There may also be a small left pleural effusion. Extensive background pulmonary parenchymal changes  including chronic bronchitic changes and emphysema. Bibasilar atelectasis. Improved pulmonary vascular congestion. No overt pulmonary edema. No pneumothorax. No acute osseous abnormality. IMPRESSION: 1. Interval resolution of pulmonary vascular congestion. 2. Persistent moderate layering right pleural effusion with fluid in the major and minor fissures. 3. Advanced bilateral chronic bronchitic changes and emphysema. 4. Bibasilar atelectasis and scarring. 5.  Aortic Atherosclerosis (ICD10-170.0) Electronically Signed   By: Jacqulynn Cadet M.D.   On: 09/25/2017 09:17   Dg Chest 2 View  Result Date: 09/23/2017 CLINICAL DATA:  Fall.  Left-sided pain. EXAM: CHEST - 2 VIEW COMPARISON:  09/22/2017. FINDINGS: PowerPort catheter noted with tip at cavoatrial junction in stable position. Mild cardiomegaly with pulmonary venous congestion, bilateral interstitial prominence, and bilateral pleural effusions, right side greater than left. Findings consistent with CHF. Multiple old right and left-sided rib fractures. IMPRESSION: 1. PowerPort catheter noted with tip over cavoatrial junction in stable position. 2. Cardiomegaly with pulmonary venous congestion, bilateral interstitial prominence and bilateral pleural effusions, right side greater than left. Findings consistent with CHF. Pneumonitis cannot be excluded. 3. Bilateral old rib fractures. No acute displaced rib fracture or pneumothorax. Electronically Signed   By: Marcello Moores  Register   On: 09/23/2017 10:55   Dg Chest 2 View  Result Date: 09/22/2017 CLINICAL DATA:  Fall.  Chest pain.  Recent pneumonia.  Lung cancer. EXAM: CHEST - 2 VIEW COMPARISON:  07/25/2017. FINDINGS: PowerPort catheter with tip over superior vena cava. Heart size stable. No pulmonary venous congestion. Bilateral pulmonary infiltrates/edema, new from prior exam. Progressive bilateral pleural effusions. Atelectatic changes right upper lung. Right upper lung mass obscured by infiltrates and effusion.  No acute bony abnormality. Old right rib fractures. IMPRESSION: 1. PowerPort catheter noted with tip over superior vena cava. 2. Bilateral pulmonary infiltrates/edema, new from prior exam. Progressive bilateral pleural effusions. Atelectatic changes right upper lung. Right upper lung mass obscured by infiltrates and effusion. Electronically Signed   By: Marcello Moores  Register   On: 09/22/2017 07:26   Ct Chest Wo Contrast  Result Date: 09/22/2017 CLINICAL DATA:  Dyspnea, history of non-small cell lung cancer. EXAM: CT CHEST WITHOUT CONTRAST TECHNIQUE: Multidetector CT imaging of the chest was performed following the standard protocol without IV contrast. COMPARISON:  Radiographs of September 22, 2017. CT scan of July 05, 2017. FINDINGS: Cardiovascular: Atherosclerosis of thoracic aorta is noted without aneurysm formation. Dissection cannot be excluded due the lack of intravenous contrast. Normal cardiac size. No pericardial effusion. Coronary artery calcifications are noted. Right internal jugular catheter is noted with tip in SVC. Mediastinum/Nodes: Thyroid gland and esophagus are unremarkable. Precarinal lymph node measuring 9 mm is noted on image number 51. Stable subcarinal lymph node measuring 1 cm is noted on image number 64. Hilar adenopathy noted on prior exam is not well evaluated currently due to lack of intravenous contrast. Lungs/Pleura: Large right pleural effusion is noted which is increased in size compared to prior exam. No pneumothorax is noted. Right upper lobe mass is again noted measuring 4.1 x 2.3 cm which is smaller compared to prior exam. There appears to be crescent-shaped scarring or fibrosis or atelectasis in right upper lobe. 12 x 6 mm subpleural nodule is noted posteriorly in the left lung apex which is not significantly changed compared to prior exam. Probable focal atelectasis or scarring is noted in lateral portion of lingular segment of left upper lobe. Upper Abdomen: Large complex mass  containing calcifications involving the pancreatic head is again noted and not significantly changed compared to prior exam. It currently measures 8.6 x 8.3 cm. Musculoskeletal: No chest wall mass or suspicious bone lesions identified. IMPRESSION: Large right pleural effusion is noted which is increased in size compared to prior exam, with adjacent subsegmental atelectasis. 4.1 x 2.3 cm right upper lobe mass is noted which is smaller compared to prior exam. There is interval development of atelectasis, scarring or fibrosis in the right upper lobe. Stable subcarinal and precarinal adenopathy is noted compared to prior exam. Hilar adenopathy noted on prior exam is not well visualized currently due  to lack of intravenous contrast. Stable 12 x 6 mm subpleural nodule is noted posteriorly in the left lung apex. Stable large complex mass with calcifications is noted in the pancreatic head. Aortic Atherosclerosis (ICD10-I70.0) and Emphysema (ICD10-J43.9). Electronically Signed   By: Marijo Conception, M.D.   On: 09/22/2017 08:32   Nm Pet Image Restag (ps) Skull Base To Thigh  Result Date: 09/27/2017 CLINICAL DATA:  Subsequent treatment strategy for lung cancer. EXAM: NUCLEAR MEDICINE PET SKULL BASE TO THIGH TECHNIQUE: 9.2 mCi F-18 FDG was injected intravenously. Full-ring PET imaging was performed from the skull base to thigh after the radiotracer. CT data was obtained and used for attenuation correction and anatomic localization. Fasting blood glucose: 78 mg/dl COMPARISON:  Multiple exams, including 07/11/2017 FINDINGS: Mediastinal blood pool activity: SUV max 2.8 NECK: No significant abnormal hypermetabolic activity in this region. Incidental CT findings: Atherosclerotic calcification of the common carotid arteries. CHEST: A right upper lobe mass measures 1.3 cm in anterior-posterior dimension (formerly 3.3 cm) with maximum SUV 6.6 (formerly 35.3). Indistinctly marginated right hilar adenopathy with maximum SUV 4.2  (formerly 8.1). A lymph node anterior to the carina measures 0.7 cm in short axis on image 95/3 (formerly 1.0 cm) with maximum SUV 2.9 (formerly 4.0). A pleural metastatic lesion on the right side measures approximately 1.8 cm in thickness (formerly 1.5 cm) with maximum SUV 31.3 (formerly 18.2). Moderate right pleural effusion is likely loculated and is reduced in size from prior. There is some accentuated activity along the anterior margin of the main pulmonary artery without a definite CT correlate, maximum SUV 6.5. 1.1 by 0.6 cm subpleural nodule at the left lung apex noted on recent CT chest is not appreciably hypermetabolic. Incidental CT findings: Coronary, aortic arch, and branch vessel atherosclerotic vascular disease. Right Port-A-Cath tip: Lower SVC. Mild nodularity along the anterior pericardial adipose tissues. Small left pleural effusion with associated passive atelectasis. ABDOMEN/PELVIS: Gastrohepatic mass believed to be arising from the pancreas with central calcification measures 8.4 by 6.8 cm on image 152/3 (formerly 8.3 by 7.1 cm) with maximum SUV 8.2 (formerly 8.0). An adjacent small mass of the left adrenal gland measuring 1.0 by 1.2 cm on image 158/3 has a maximum SUV of 10.0 (formerly 4.6). Activity along the perineum is compatible with mild urinary incontinence. Accentuated activity in the hepatic flexure with maximum SUV 9.2 but without CT correlate, likely physiologic. Incidental CT findings: Aortoiliac atherosclerotic vascular disease. SKELETON: Subacute fractures of the left lateral sixth, seventh, eighth, and ninth ribs with associated low-grade activity compatible with benign rib fractures. Incidental CT findings: Old rib fractures and old left pelvic fractures. IMPRESSION: 1. Prominently reduced size and activity of the dominant right upper lobe mass, with improved hilar and lower paratracheal adenopathy. However, a pleural metastatic lesion on the right side is larger and more  hypermetabolic than on the prior exam. 2. Increased activity along a small left adrenal mass. 3. Essentially stable large pancreatic mass with central calcification,, slowly enlarging over the past several years, and with moderately abnormal accentuated metabolic activity. 4. Subacute left lateral rib fractures. Moderate right and small left pleural effusions. 5. Other imaging findings of potential clinical significance: Aortic Atherosclerosis (ICD10-I70.0). Coronary atherosclerosis. Old rib fractures and old pelvic fractures are also noted. Electronically Signed   By: Van Clines M.D.   On: 09/27/2017 14:55   US Thoracentesis Asp Pleural Space W/img Guide  Result Date: 09/22/2017 CLINICAL DATA:  Right pleural effusion and history of lung carcinoma. EXAM: ULTRASOUND GUIDED  RIGHT THORACENTESIS COMPARISON:  CT of the chest on 09/22/2017 PROCEDURE: An ultrasound guided thoracentesis was thoroughly discussed with the patient and questions answered. The benefits, risks, alternatives and complications were also discussed. The patient understands and wishes to proceed with the procedure. Written consent was obtained. A time-out was performed prior to initiating the procedure. Ultrasound was performed to localize and mark an adequate pocket of fluid in the right chest. The area was then prepped and draped in the normal sterile fashion. 1% Lidocaine was used for local anesthesia. Under ultrasound guidance a 6 French Safe-T-Centesis catheter was introduced. Thoracentesis was performed. The catheter was removed and a dressing applied. COMPLICATIONS: None FINDINGS: A total of approximately 1.3 L of blood tinged fluid was removed. A fluid sample was sent for laboratory analysis. IMPRESSION: Successful ultrasound guided right thoracentesis yielding 1.3 L of pleural fluid. Electronically Signed   By: Aletta Edouard M.D.   On: 09/22/2017 13:41    ASSESSMENT: Clinical stage IIIa squamous cell carcinoma of the right  upper lobe lung, PDL-1 =20%.  PLAN:    1.  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 Patient was discussed at cancer conference.  Patient completed her accelerated course of XRT on August 12, 2017 along with her concurrent chemotherapy.  PET scan results from September 27, 2017 reviewed independently and reported as above with significant improvement of the right upper lobe mass and hilar/paratracheal lymphadenopathy.  However pleural metastatic lesion on the right side is larger and more hypermetabolic.  This is also likely the etiology of her recent pleural effusion that was removed with thoracentesis recently.  Patient was given a referral back to radiation oncology for palliative treatment of the right pleural metastatic lesion.  Proceed with cycle 4 of maintenance Imfinzi today.  Return to clinic in 2 weeks for further evaluation and consideration of cycle 5. 2.  Shortness of breath: Improved.  Thoracentesis on September 22, 2017 removed 1.3 L fluid.  Radiation oncology referral as above. 3.  Poor appetite: Improving.  Continue Megace as prescribed. 4.  Anxiety: Chronic and unchanged.  Continue Xanax as prescribed. 5.  Hyponatremia: Sodium is decreased, but unchanged at 132.  Monitor.  6.  Anemia: Hemoglobin remains decreased at 9.6, but relatively unchanged.  She does not require transfusion at this time. 7.  Diarrhea: Patient does not complain of this today.  Continue OTC treatments as needed.   8.  Rash: Unclear etiology, but appears related to medication.  Patient was given a Medrol Dosepak today which should also help with her appetite and decreased energy. 9.  Port: Appears related to tape used, monitor.  Patient expressed understanding and was in agreement with this plan. She also understands that She can call clinic at any time with any  questions, concerns, or complaints.   Cancer Staging Squamous cell carcinoma lung, right ([place]) Staging form: Lung, AJCC 8th Edition - Clinical stage from 07/18/2017: Stage IIIA (cT2b, cN2, cM0) - Signed by Lloyd Huger, MD on 07/18/2017   Lloyd Huger, MD   10/08/2017 9:26 AM

## 2017-10-06 ENCOUNTER — Inpatient Hospital Stay: Payer: Medicare Other

## 2017-10-06 ENCOUNTER — Inpatient Hospital Stay (HOSPITAL_BASED_OUTPATIENT_CLINIC_OR_DEPARTMENT_OTHER): Payer: Medicare Other | Admitting: Oncology

## 2017-10-06 ENCOUNTER — Encounter: Payer: Self-pay | Admitting: Radiation Oncology

## 2017-10-06 ENCOUNTER — Ambulatory Visit
Admission: RE | Admit: 2017-10-06 | Discharge: 2017-10-06 | Disposition: A | Discharge status: Home / Self Care | Payer: No Typology Code available for payment source | Source: Ambulatory Visit | Attending: Radiation Oncology | Admitting: Radiation Oncology

## 2017-10-06 ENCOUNTER — Other Ambulatory Visit: Payer: Self-pay

## 2017-10-06 ENCOUNTER — Encounter: Payer: Self-pay | Admitting: Oncology

## 2017-10-06 VITALS — BP 149/77 | HR 102 | Temp 97.8°F | Resp 18 | Wt 167.9 lb

## 2017-10-06 DIAGNOSIS — Z79899 Other long term (current) drug therapy: Secondary | ICD-10-CM

## 2017-10-06 DIAGNOSIS — C3411 Malignant neoplasm of upper lobe, right bronchus or lung: Secondary | ICD-10-CM | POA: Insufficient documentation

## 2017-10-06 DIAGNOSIS — J90 Pleural effusion, not elsewhere classified: Secondary | ICD-10-CM

## 2017-10-06 DIAGNOSIS — R599 Enlarged lymph nodes, unspecified: Secondary | ICD-10-CM | POA: Insufficient documentation

## 2017-10-06 DIAGNOSIS — F419 Anxiety disorder, unspecified: Secondary | ICD-10-CM | POA: Diagnosis not present

## 2017-10-06 DIAGNOSIS — C3491 Malignant neoplasm of unspecified part of right bronchus or lung: Secondary | ICD-10-CM

## 2017-10-06 DIAGNOSIS — M8580 Other specified disorders of bone density and structure, unspecified site: Secondary | ICD-10-CM

## 2017-10-06 DIAGNOSIS — K219 Gastro-esophageal reflux disease without esophagitis: Secondary | ICD-10-CM

## 2017-10-06 DIAGNOSIS — K861 Other chronic pancreatitis: Secondary | ICD-10-CM

## 2017-10-06 DIAGNOSIS — W19XXXA Unspecified fall, initial encounter: Secondary | ICD-10-CM | POA: Insufficient documentation

## 2017-10-06 DIAGNOSIS — C782 Secondary malignant neoplasm of pleura: Secondary | ICD-10-CM | POA: Insufficient documentation

## 2017-10-06 DIAGNOSIS — Z87891 Personal history of nicotine dependence: Secondary | ICD-10-CM | POA: Diagnosis not present

## 2017-10-06 DIAGNOSIS — E871 Hypo-osmolality and hyponatremia: Secondary | ICD-10-CM

## 2017-10-06 DIAGNOSIS — R630 Anorexia: Secondary | ICD-10-CM

## 2017-10-06 DIAGNOSIS — S2249XA Multiple fractures of ribs, unspecified side, initial encounter for closed fracture: Secondary | ICD-10-CM | POA: Insufficient documentation

## 2017-10-06 DIAGNOSIS — R5383 Other fatigue: Secondary | ICD-10-CM

## 2017-10-06 DIAGNOSIS — R531 Weakness: Secondary | ICD-10-CM

## 2017-10-06 DIAGNOSIS — K5909 Other constipation: Secondary | ICD-10-CM

## 2017-10-06 DIAGNOSIS — J9811 Atelectasis: Secondary | ICD-10-CM

## 2017-10-06 DIAGNOSIS — Z5112 Encounter for antineoplastic immunotherapy: Secondary | ICD-10-CM | POA: Diagnosis not present

## 2017-10-06 DIAGNOSIS — D509 Iron deficiency anemia, unspecified: Secondary | ICD-10-CM

## 2017-10-06 DIAGNOSIS — E279 Disorder of adrenal gland, unspecified: Secondary | ICD-10-CM | POA: Insufficient documentation

## 2017-10-06 DIAGNOSIS — Z801 Family history of malignant neoplasm of trachea, bronchus and lung: Secondary | ICD-10-CM

## 2017-10-06 DIAGNOSIS — R21 Rash and other nonspecific skin eruption: Secondary | ICD-10-CM

## 2017-10-06 DIAGNOSIS — J449 Chronic obstructive pulmonary disease, unspecified: Secondary | ICD-10-CM

## 2017-10-06 DIAGNOSIS — Z803 Family history of malignant neoplasm of breast: Secondary | ICD-10-CM

## 2017-10-06 DIAGNOSIS — I700 Atherosclerosis of aorta: Secondary | ICD-10-CM

## 2017-10-06 DIAGNOSIS — I872 Venous insufficiency (chronic) (peripheral): Secondary | ICD-10-CM

## 2017-10-06 DIAGNOSIS — I10 Essential (primary) hypertension: Secondary | ICD-10-CM

## 2017-10-06 DIAGNOSIS — E039 Hypothyroidism, unspecified: Secondary | ICD-10-CM

## 2017-10-06 LAB — COMPREHENSIVE METABOLIC PANEL
ALBUMIN: 3.2 g/dL — AB (ref 3.5–5.0)
ALK PHOS: 68 U/L (ref 38–126)
ALT: 10 U/L (ref 0–44)
ANION GAP: 8 (ref 5–15)
AST: 16 U/L (ref 15–41)
BUN: 24 mg/dL — ABNORMAL HIGH (ref 8–23)
CO2: 22 mmol/L (ref 22–32)
Calcium: 9.1 mg/dL (ref 8.9–10.3)
Chloride: 102 mmol/L (ref 98–111)
Creatinine, Ser: 1.02 mg/dL — ABNORMAL HIGH (ref 0.44–1.00)
GFR calc non Af Amer: 48 mL/min — ABNORMAL LOW (ref >60)
GFR, EST AFRICAN AMERICAN: 56 mL/min — AB (ref >60)
GLUCOSE: 102 mg/dL — AB (ref 70–99)
Potassium: 4 mmol/L (ref 3.5–5.1)
Sodium: 132 mmol/L — ABNORMAL LOW (ref 135–145)
Total Bilirubin: 0.6 mg/dL (ref 0.3–1.2)
Total Protein: 7.4 g/dL (ref 6.5–8.1)

## 2017-10-06 LAB — CBC WITH DIFFERENTIAL/PLATELET
BASOS ABS: 0.1 10*3/uL (ref 0–0.1)
Basophils Relative: 1 %
EOS PCT: 7 %
Eosinophils Absolute: 0.6 10*3/uL (ref 0–0.7)
HCT: 28.2 % — ABNORMAL LOW (ref 35.0–47.0)
HEMOGLOBIN: 9.6 g/dL — AB (ref 12.0–16.0)
Lymphocytes Relative: 6 %
Lymphs Abs: 0.6 10*3/uL — ABNORMAL LOW (ref 1.0–3.6)
MCH: 28.7 pg (ref 26.0–34.0)
MCHC: 34 g/dL (ref 32.0–36.0)
MCV: 84.4 fL (ref 80.0–100.0)
Monocytes Absolute: 0.9 10*3/uL (ref 0.2–0.9)
Monocytes Relative: 10 %
NEUTROS ABS: 7.2 10*3/uL — AB (ref 1.4–6.5)
Neutrophils Relative %: 76 %
PLATELETS: 508 10*3/uL — AB (ref 150–440)
RBC: 3.34 MIL/uL — ABNORMAL LOW (ref 3.80–5.20)
RDW: 21.8 % — ABNORMAL HIGH (ref 11.5–14.5)
WBC: 9.4 10*3/uL (ref 3.6–11.0)

## 2017-10-06 MED ORDER — PREDNISONE 10 MG (21) PO TBPK: ORAL_TABLET | ORAL | 0 refills | Status: DC

## 2017-10-06 MED ORDER — SODIUM CHLORIDE 0.9 % IV SOLN
740.0000 mg | Freq: Once | INTRAVENOUS | Status: AC
  Administered 2017-10-06: 740 mg via INTRAVENOUS
  Filled 2017-10-06: qty 10

## 2017-10-06 MED ORDER — SODIUM CHLORIDE 0.9 % IV SOLN
Freq: Once | INTRAVENOUS | Status: AC
  Administered 2017-10-06: 11:00:00 via INTRAVENOUS
  Filled 2017-10-06: qty 250

## 2017-10-06 MED ORDER — NITROFURANTOIN MONOHYD MACRO 100 MG PO CAPS: 100.0000 mg | ORAL_CAPSULE | Freq: Two times a day (BID) | ORAL | 0 refills | Status: AC

## 2017-10-06 MED ORDER — HEPARIN SOD (PORK) LOCK FLUSH 100 UNIT/ML IV SOLN
500.0000 [IU] | Freq: Once | INTRAVENOUS | Status: AC | PRN
  Administered 2017-10-06: 500 [IU]
  Filled 2017-10-06: qty 5

## 2017-10-06 NOTE — Progress Notes (Signed)
Patient reports redness and itching to port. Patient also reports rash on neck, back and hips that is red and itches.

## 2017-10-06 NOTE — Progress Notes (Signed)
Radiation Oncology Follow up Note Old patient new area lung pleura  Name: Nicole Arnold   Date:   10/06/2017 MRN:  [id number] DOB: 12-Dec-1928    This 84 y.o. female presents to the clinic today for evaluation of right lower lobe hypermetabolic pleural lesion in patient with known stage IIIa possibly stage IV non-small cell lung cancer of the right upper lobe with successful concurrent chemoradiation to right upper lobe lesion.  REFERRING PROVIDER: Venia Carbon, MD  HPI: patient is well-known to department having completed concurrent chemoradiation therapy for a right upper lobe non-small cell lung cancer..she's had repeated pleural effusions drained with no evidence of malignant cytology. She had a recent PET CT scan earlier this month showing significant improvement of the right upper lobe mass and hilar paratracheal lymphadenopathy. There is a progressive right pleural metastatic lesion which is larger more hypermetabolic. This may be the source  Her case was discussed at our weekly tumor conference and treatment to the right pleural lesion was recommended. She's having no pain she does have left-sided chest pain although she has multiple rib fractures from a fall. She specifically denies cough hemoptysis or chest tightness. She has completed 4 cycles of maintenance Imfinzi.she was also noted on recent PET CT scan to have hypermetabolic activity focally in the left adrenal gland possibly demonstrating stage IV disease.  COMPLICATIONS OF TREATMENT: none  FOLLOW UP COMPLIANCE: keeps appointments   PHYSICAL EXAM:  BP (!) 149/77 (BP Location: Left Arm, Patient Position: Sitting)   Pulse (!) 102   Temp 97.8 F (36.6 C) (Tympanic)   Resp 18   Wt 167 lb 14.1 oz (76.1 kg)   BMI 29.74 kg/m  [place] elderly female in NAD wheelchair-bound.Well-developed well-nourished patient in NAD. HEENT reveals PERLA, EOMI, discs not visualized.  Oral cavity is clear. No oral mucosal lesions are  identified. Neck is clear without evidence of cervical or supraclavicular adenopathy. Lungs are clear to A&P. Cardiac examination is essentially unremarkable with regular rate and rhythm without murmur rub or thrill. Abdomen is benign with no organomegaly or masses noted. Motor sensory and DTR levels are equal and symmetric in the upper and lower extremities. Cranial nerves II through XII are grossly intact. Proprioception is intact. No peripheral adenopathy or edema is identified. No motor or sensory levels are noted. Crude visual fields are within normal range.  RADIOLOGY RESULTS: PET/CT scans and CT scans reviewed  PLAN: at this time I to go ahead with a hypofractionated course of radiation therapy to her right pleural lesion. Would plan on delivering 3000 cGy in 5 fractions. Would opt to use I am RT treatment planning and delivery to spare critical structures such as the nearby liver previously radiated fields and small bowel. Risks and benefits of treatment including skin reaction fatigue alteration of blood counts possible developing of cough all were discussed in detail with the patient and her daughter. They both seem to comprehend my treatment plan well. I personally set up and ordered CT simulation for early next week.  I would like to take this opportunity to thank you for allowing me to participate in the care of your patient.Noreene Filbert, MD

## 2017-10-07 ENCOUNTER — Telehealth: Payer: Self-pay | Admitting: *Deleted

## 2017-10-07 NOTE — Telephone Encounter (Signed)
Patient's daughter called and said she was supposed to call Tillie Rung back but has misplaced the number. Please call her.

## 2017-10-10 ENCOUNTER — Ambulatory Visit
Admission: RE | Admit: 2017-10-10 | Discharge: 2017-10-10 | Disposition: A | Discharge status: Home / Self Care | Payer: Medicare Other | Source: Ambulatory Visit | Attending: Radiation Oncology | Admitting: Radiation Oncology

## 2017-10-10 DIAGNOSIS — Z510 Encounter for antineoplastic radiation therapy: Secondary | ICD-10-CM | POA: Diagnosis not present

## 2017-10-10 DIAGNOSIS — C3411 Malignant neoplasm of upper lobe, right bronchus or lung: Secondary | ICD-10-CM | POA: Diagnosis not present

## 2017-10-11 DIAGNOSIS — Z510 Encounter for antineoplastic radiation therapy: Secondary | ICD-10-CM | POA: Diagnosis not present

## 2017-10-13 DIAGNOSIS — J439 Emphysema, unspecified: Secondary | ICD-10-CM | POA: Diagnosis not present

## 2017-10-13 DIAGNOSIS — I10 Essential (primary) hypertension: Secondary | ICD-10-CM

## 2017-10-13 DIAGNOSIS — C782 Secondary malignant neoplasm of pleura: Secondary | ICD-10-CM | POA: Diagnosis not present

## 2017-10-13 DIAGNOSIS — C3490 Malignant neoplasm of unspecified part of unspecified bronchus or lung: Secondary | ICD-10-CM | POA: Diagnosis not present

## 2017-10-13 DIAGNOSIS — F39 Unspecified mood [affective] disorder: Secondary | ICD-10-CM

## 2017-10-16 NOTE — Progress Notes (Signed)
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  INTERVAL HISTORY: Patient returns to clinic today for further evaluation and consideration of cycle 4 of maintenance Imfinzi.  She continues to have persistent weakness and fatigue.  She also is having increased issue with memory.  She has no neurologic complaints.  She denies any fevers.  She denies any chest pain, hemoptysis, or cough.  She has occasional shortness of breath.  She has a poor appetite, but states this is improving. She denies any nausea, vomiting, constipation, or diarrhea. She has no urinary complaints.  Patient offers no further specific complaints today.  REVIEW OF SYSTEMS:   Review of Systems  Constitutional: Positive for malaise/fatigue. Negative for fever and weight loss.  Respiratory: Positive for shortness of breath. Negative for cough.   Cardiovascular: Negative.  Negative for chest pain and leg swelling.  Gastrointestinal: Negative.  Negative for abdominal pain, constipation and diarrhea.  Genitourinary: Negative.  Negative for dysuria.  Musculoskeletal: Negative.  Negative for back pain.  Skin: Positive for itching. Negative for rash.  Neurological: Positive for weakness. Negative for sensory change, focal weakness and headaches.  Psychiatric/Behavioral: Positive for memory loss. Negative for depression. The patient is not nervous/anxious.     As per HPI. Otherwise, a complete review of systems is negative.  PAST MEDICAL HISTORY: Past Medical History:  Diagnosis Date  . Cataract   . Chronic constipation   . Chronic venous insufficiency   . COPD (chronic obstructive pulmonary disease) ([place])   .  Essential hypertension, benign   . GERD (gastroesophageal reflux disease)   . Hypothyroidism   . Mood disorder ([place])    mostly anxiety  . Non-small cell lung cancer with metastasis ([place])   . Osteopenia   . Pancreatic mass   . Pancreatitis since 1961   chronic    PAST SURGICAL HISTORY: Past Surgical History:  Procedure Laterality Date  . APPENDECTOMY  1949  . CATARACT EXTRACTION Right 1987  . CATARACT EXTRACTION Left 1986  . detached retina repair Right 1991  . OOPHORECTOMY  1959   right ovary, part of left  . PORTA CATH INSERTION N/A 07/18/2017   Procedure: PORTA CATH INSERTION;  Surgeon: Algernon Huxley, MD;  Location: [facility];  Service: Cardiovascular;  Laterality: N/A;  . RETINAL DETACHMENT SURGERY Left 1997  . THORACOTOMY Left 2008   due to pneumonia  . uterine hysterectomy  1978    FAMILY HISTORY: Family History  Problem Relation Age of Onset  . Lung cancer Sister   . Breast cancer Neg Hx     ADVANCED DIRECTIVES (Y/N):  N  HEALTH MAINTENANCE: Social History   Tobacco Use  . Smoking status: Former Smoker    Packs/day: 0.50    Years: 30.00    Pack years: 15.00    Types: Cigarettes    Last attempt to quit: 01/25/1993    Years since quitting: 24.7  . Smokeless tobacco: Never Used  Substance Use Topics  . Alcohol use: No  . Drug use: No     Colonoscopy:  PAP:  Bone density:  Lipid panel:  Allergies  Allergen Reactions  . Amoxicillin     Has patient had a PCN reaction causing immediate  rash, facial/tongue/throat swelling, SOB or lightheadedness with hypotension: Yes Has patient had a PCN reaction causing severe rash involving mucus membranes or skin necrosis: No Has patient had a PCN reaction that required hospitalization: Unknown Has patient had a PCN reaction occurring within the last 10 years: Unknown If all of the above answers are "NO", then may proceed with Cephalosporin use.   . Zyrtec [Cetirizine] Itching  . Codeine     vomiting  .  Neosporin [Neomycin-Bacitracin Zn-Polymyx]   . Lidocaine-Prilocaine Rash    Rash w emla cream  . Sulfa Antibiotics Rash    Current Outpatient Medications  Medication Sig Dispense Refill  . ALPRAZolam (XANAX) 0.5 MG tablet Take 0.5 mg by mouth 3 (three) times daily.     Marland Kitchen alum hydroxide-mag trisilicate (GAVISCON) 19-37 MG CHEW chewable tablet Chew 2 tablets by mouth 3 (three) times daily as needed for indigestion or heartburn.    . cyclobenzaprine (FLEXERIL) 5 MG tablet Take 1 tablet (5 mg total) by mouth 2 (two) times daily as needed for muscle spasms (muscle pain). 30 tablet 0  . furosemide (LASIX) 20 MG tablet Take 1 tablet (20 mg total) by mouth daily. 30 tablet 1  . hydrocortisone cream 1 % Apply 1 application topically 2 (two) times daily.    . hydrOXYzine (ATARAX/VISTARIL) 25 MG tablet Take 25 mg by mouth 3 (three) times daily as needed.    . lansoprazole (PREVACID) 30 MG capsule Take 30 mg by mouth daily at 12 noon.    Marland Kitchen levothyroxine (SYNTHROID, LEVOTHROID) 50 MCG tablet Take 50 mcg by mouth daily before breakfast.    . lidocaine (LIDODERM) 5 % Place 1 patch onto the skin daily. Remove & Discard patch within 12 hours or as directed by MD 15 patch 0  . loperamide (IMODIUM) 2 MG capsule Take 2 capsules (4MG) by mouth and 1 capsule (2MG) after each subsequent loose stool - max 8 capsules daily    . megestrol (MEGACE) 40 MG tablet Take 1 tablet (40 mg total) by mouth daily. 30 tablet 0  . MICARDIS 40 MG tablet Take 40 mg by mouth daily.    . ondansetron (ZOFRAN) 8 MG tablet Take 8 mg by mouth 2 (two) times daily as needed for nausea or vomiting.    Marland Kitchen PARoxetine (PAXIL) 30 MG tablet Take 30 mg by mouth daily.     . polyethylene glycol (MIRALAX / GLYCOLAX) packet Take 17 g by mouth daily as needed for mild constipation. 14 each 0  . predniSONE (STERAPRED UNI-PAK 21 TAB) 10 MG (21) TBPK tablet Taper as directed 21 tablet 0  . protein supplement shake (PREMIER PROTEIN) LIQD Take 2 oz by mouth  2 (two) times daily between meals.    . traMADol (ULTRAM) 50 MG tablet Take 50 mg by mouth every 4 (four) hours as needed for moderate pain.     Marland Kitchen triamcinolone cream (KENALOG) 0.1 % Apply 1 application topically 3 (three) times daily.    Marland Kitchen umeclidinium-vilanterol (ANORO ELLIPTA) 62.5-25 MCG/INH AEPB Inhale 1 puff daily into the lungs. 180 each 3  . acetaminophen (TYLENOL) 325 MG tablet Take 650 mg by mouth every 4 (four) hours as needed for mild pain or fever.     . lidocaine-prilocaine (EMLA) cream Apply 1 application topically as needed.    . loperamide (IMODIUM) 2 MG capsule Take 2 mg by mouth as needed for diarrhea or loose stools.     No current facility-administered medications for this visit.  Facility-Administered Medications Ordered in Other Visits  Medication Dose Route Frequency Provider Last Rate Last Dose  . heparin lock flush 100 unit/mL  500 Units Intravenous Once Lloyd Huger, MD        OBJECTIVE: Vitals:   10/20/17 0941  BP: (!) 144/69  Pulse: 89  Resp: 18  Temp: (!) 97.4 F (36.3 C)     Body mass index is 28.01 kg/m.    ECOG FS:2 - Symptomatic, <50% confined to bed   General: Well-developed, well-nourished, no acute distress.  Sitting in a wheelchair. Eyes: Pink conjunctiva, anicteric sclera. HEENT: Normocephalic, moist mucous membranes. Lungs: Clear to auscultation bilaterally. Heart: Regular rate and rhythm. No rubs, murmurs, or gallops. Abdomen: Soft, nontender, nondistended. No organomegaly noted, normoactive bowel sounds. Musculoskeletal: No edema, cyanosis, or clubbing. Neuro: Alert, answering all questions appropriately. Cranial nerves grossly intact. Skin: No rashes or petechiae noted. Psych: Normal affect.  LAB RESULTS:  Lab Results  Component Value Date   NA 133 (L) 10/20/2017   K 4.5 10/20/2017   CL 103 10/20/2017   CO2 22 10/20/2017   GLUCOSE 120 (H) 10/20/2017   BUN 25 (H) 10/20/2017   CREATININE 1.10 (H) 10/20/2017   CALCIUM  8.7 (L) 10/20/2017   PROT 7.5 10/20/2017   ALBUMIN 3.3 (L) 10/20/2017   AST 14 (L) 10/20/2017   ALT 10 10/20/2017   ALKPHOS 60 10/20/2017   BILITOT 0.5 10/20/2017   GFRNONAA 44 (L) 10/20/2017   GFRAA 51 (L) 10/20/2017    Lab Results  Component Value Date   WBC 8.6 10/20/2017   NEUTROABS 6.9 (H) 10/20/2017   HGB 10.1 (L) 10/20/2017   HCT 30.2 (L) 10/20/2017   MCV 86.6 10/20/2017   PLT 338 10/20/2017     STUDIES: Dg Chest 2 View  Result Date: 10/21/2017 CLINICAL DATA:  Cough. Prior history of rib fractures. History of right lung malignancy. History of left thoracotomy. EXAM: CHEST - 2 VIEW COMPARISON:  PET-CT 09/27/2017.  Chest x-ray 09/25/2017. FINDINGS: PowerPort catheter with tip over superior vena cava. Heart size normal. Improved aeration of both lungs. No focal infiltrate noted. Partial scratch resolution of bilateral pleural effusions with mild residual. No pneumothorax P heart size normal. Bilateral scratched it stable bilateral rib fractures. IMPRESSION: 1.  PowerPort catheter stable position. 2. Improved aeration of both lungs. No focal infiltrate noted on today's exam. Partial resolution of bilateral pleural effusions with mild residual. 3. Previously identified rib fractures are unchanged. No pneumothorax. Electronically Signed   By: Marcello Moores  Register   On: 10/21/2017 06:45   Dg Chest 2 View  Result Date: 09/25/2017 CLINICAL DATA:  84 year old female currently admitted for congestive heart failure. Clinical history includes stage III squamous cell carcinoma of the right lung status post radiation and chemotherapy completed in July 2019 currently on every other week immunotherapy. EXAM: CHEST - 2 VIEW COMPARISON:  Prior chest x-ray 09/23/2017 FINDINGS: Stable right IJ approach single-lumen power injectable port catheter with the tip overlying the cavoatrial junction. The cardiac and mediastinal contours remain unchanged in within normal limits. Atherosclerotic calcifications are  present in the transverse aorta. Moderate right pleural effusion with some fluid trapped in the major and minor fissures. There may also be a small left pleural effusion. Extensive background pulmonary parenchymal changes including chronic bronchitic changes and emphysema. Bibasilar atelectasis. Improved pulmonary vascular congestion. No overt pulmonary edema. No pneumothorax. No acute osseous abnormality. IMPRESSION: 1. Interval resolution of pulmonary vascular congestion. 2. Persistent moderate layering right pleural effusion with fluid  in the major and minor fissures. 3. Advanced bilateral chronic bronchitic changes and emphysema. 4. Bibasilar atelectasis and scarring. 5.  Aortic Atherosclerosis (ICD10-170.0) Electronically Signed   By: Jacqulynn Cadet M.D.   On: 09/25/2017 09:17   Nm Pet Image Restag (ps) Skull Base To Thigh  Result Date: 09/27/2017 CLINICAL DATA:  Subsequent treatment strategy for lung cancer. EXAM: NUCLEAR MEDICINE PET SKULL BASE TO THIGH TECHNIQUE: 9.2 mCi F-18 FDG was injected intravenously. Full-ring PET imaging was performed from the skull base to thigh after the radiotracer. CT data was obtained and used for attenuation correction and anatomic localization. Fasting blood glucose: 78 mg/dl COMPARISON:  Multiple exams, including 07/11/2017 FINDINGS: Mediastinal blood pool activity: SUV max 2.8 NECK: No significant abnormal hypermetabolic activity in this region. Incidental CT findings: Atherosclerotic calcification of the common carotid arteries. CHEST: A right upper lobe mass measures 1.3 cm in anterior-posterior dimension (formerly 3.3 cm) with maximum SUV 6.6 (formerly 35.3). Indistinctly marginated right hilar adenopathy with maximum SUV 4.2 (formerly 8.1). A lymph node anterior to the carina measures 0.7 cm in short axis on image 95/3 (formerly 1.0 cm) with maximum SUV 2.9 (formerly 4.0). A pleural metastatic lesion on the right side measures approximately 1.8 cm in thickness  (formerly 1.5 cm) with maximum SUV 31.3 (formerly 18.2). Moderate right pleural effusion is likely loculated and is reduced in size from prior. There is some accentuated activity along the anterior margin of the main pulmonary artery without a definite CT correlate, maximum SUV 6.5. 1.1 by 0.6 cm subpleural nodule at the left lung apex noted on recent CT chest is not appreciably hypermetabolic. Incidental CT findings: Coronary, aortic arch, and branch vessel atherosclerotic vascular disease. Right Port-A-Cath tip: Lower SVC. Mild nodularity along the anterior pericardial adipose tissues. Small left pleural effusion with associated passive atelectasis. ABDOMEN/PELVIS: Gastrohepatic mass believed to be arising from the pancreas with central calcification measures 8.4 by 6.8 cm on image 152/3 (formerly 8.3 by 7.1 cm) with maximum SUV 8.2 (formerly 8.0). An adjacent small mass of the left adrenal gland measuring 1.0 by 1.2 cm on image 158/3 has a maximum SUV of 10.0 (formerly 4.6). Activity along the perineum is compatible with mild urinary incontinence. Accentuated activity in the hepatic flexure with maximum SUV 9.2 but without CT correlate, likely physiologic. Incidental CT findings: Aortoiliac atherosclerotic vascular disease. SKELETON: Subacute fractures of the left lateral sixth, seventh, eighth, and ninth ribs with associated low-grade activity compatible with benign rib fractures. Incidental CT findings: Old rib fractures and old left pelvic fractures. IMPRESSION: 1. Prominently reduced size and activity of the dominant right upper lobe mass, with improved hilar and lower paratracheal adenopathy. However, a pleural metastatic lesion on the right side is larger and more hypermetabolic than on the prior exam. 2. Increased activity along a small left adrenal mass. 3. Essentially stable large pancreatic mass with central calcification,, slowly enlarging over the past several years, and with moderately abnormal  accentuated metabolic activity. 4. Subacute left lateral rib fractures. Moderate right and small left pleural effusions. 5. Other imaging findings of potential clinical significance: Aortic Atherosclerosis (ICD10-I70.0). Coronary atherosclerosis. Old rib fractures and old pelvic fractures are also noted. Electronically Signed   By: Van Clines M.D.   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  PLAN:    1.  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  PET scan results from September 27, 2017 reviewed independently with significant improvement of the right upper lobe mass and hilar/paratracheal lymphadenopathy.  However pleural metastatic lesion on the right side is larger and more hypermetabolic.  This is also likely the etiology of her recent pleural effusion that was removed with thoracentesis recently.  Patient will reinitiate XRT next week.  Proceed with cycle 4 of maintenance Imfinzi today.  Return to clinic in 2 weeks for further evaluation and continuation of treatment.   2.  Shortness of breath: Improved.  Chest x-ray from today revealed improved aeration with no obvious infiltrates.  Thoracentesis on September 22, 2017 removed 1.3 L fluid.  Radiation oncology referral as above. 3.  Poor appetite: Improving.  Continue Megace as prescribed. 4.  Anxiety: Chronic and unchanged.  Continue Xanax as prescribed. 5.  Hyponatremia: Chronic and relatively unchanged.  Sodium is 133. 6.  Anemia: Hemoglobin slightly improved to 10.1.  She does not require transfusion at this time. 7.  Diarrhea: Patient does not complain of this today.  Continue OTC treatments as needed.   8.  Rash: Resolved.   Patient expressed understanding and was in agreement with this plan. She also understands that She  can call clinic at any time with any questions, concerns, or complaints.   Cancer Staging Squamous cell carcinoma lung, right ([place]) Staging form: Lung, AJCC 8th Edition - Clinical stage from 07/18/2017: Stage IIIA (cT2b, cN2, cM0) - Signed by Lloyd Huger, MD on 07/18/2017   Lloyd Huger, MD   10/23/2017 9:11 AM

## 2017-10-20 ENCOUNTER — Other Ambulatory Visit: Payer: Self-pay

## 2017-10-20 ENCOUNTER — Inpatient Hospital Stay: Payer: Medicare Other

## 2017-10-20 ENCOUNTER — Ambulatory Visit
Admission: RE | Admit: 2017-10-20 | Discharge: 2017-10-20 | Disposition: A | Discharge status: Home / Self Care | Payer: Medicare Other | Source: Ambulatory Visit | Attending: Oncology | Admitting: Oncology

## 2017-10-20 ENCOUNTER — Inpatient Hospital Stay (HOSPITAL_BASED_OUTPATIENT_CLINIC_OR_DEPARTMENT_OTHER): Payer: Medicare Other | Admitting: Oncology

## 2017-10-20 VITALS — BP 144/69 | HR 89 | Temp 97.4°F | Resp 18 | Wt 158.1 lb

## 2017-10-20 DIAGNOSIS — J9811 Atelectasis: Secondary | ICD-10-CM

## 2017-10-20 DIAGNOSIS — E871 Hypo-osmolality and hyponatremia: Secondary | ICD-10-CM

## 2017-10-20 DIAGNOSIS — C3491 Malignant neoplasm of unspecified part of right bronchus or lung: Secondary | ICD-10-CM

## 2017-10-20 DIAGNOSIS — J90 Pleural effusion, not elsewhere classified: Secondary | ICD-10-CM

## 2017-10-20 DIAGNOSIS — K861 Other chronic pancreatitis: Secondary | ICD-10-CM

## 2017-10-20 DIAGNOSIS — F419 Anxiety disorder, unspecified: Secondary | ICD-10-CM

## 2017-10-20 DIAGNOSIS — R630 Anorexia: Secondary | ICD-10-CM

## 2017-10-20 DIAGNOSIS — Z959 Presence of cardiac and vascular implant and graft, unspecified: Secondary | ICD-10-CM | POA: Insufficient documentation

## 2017-10-20 DIAGNOSIS — M8580 Other specified disorders of bone density and structure, unspecified site: Secondary | ICD-10-CM

## 2017-10-20 DIAGNOSIS — Z5112 Encounter for antineoplastic immunotherapy: Secondary | ICD-10-CM

## 2017-10-20 DIAGNOSIS — I872 Venous insufficiency (chronic) (peripheral): Secondary | ICD-10-CM

## 2017-10-20 DIAGNOSIS — R531 Weakness: Secondary | ICD-10-CM

## 2017-10-20 DIAGNOSIS — M8448XA Pathological fracture, other site, initial encounter for fracture: Secondary | ICD-10-CM | POA: Insufficient documentation

## 2017-10-20 DIAGNOSIS — Z87891 Personal history of nicotine dependence: Secondary | ICD-10-CM

## 2017-10-20 DIAGNOSIS — J449 Chronic obstructive pulmonary disease, unspecified: Secondary | ICD-10-CM

## 2017-10-20 DIAGNOSIS — K219 Gastro-esophageal reflux disease without esophagitis: Secondary | ICD-10-CM

## 2017-10-20 DIAGNOSIS — C3411 Malignant neoplasm of upper lobe, right bronchus or lung: Secondary | ICD-10-CM

## 2017-10-20 DIAGNOSIS — R5383 Other fatigue: Secondary | ICD-10-CM

## 2017-10-20 DIAGNOSIS — R413 Other amnesia: Secondary | ICD-10-CM

## 2017-10-20 DIAGNOSIS — I700 Atherosclerosis of aorta: Secondary | ICD-10-CM

## 2017-10-20 DIAGNOSIS — R0602 Shortness of breath: Secondary | ICD-10-CM

## 2017-10-20 DIAGNOSIS — K5909 Other constipation: Secondary | ICD-10-CM

## 2017-10-20 DIAGNOSIS — Z79899 Other long term (current) drug therapy: Secondary | ICD-10-CM

## 2017-10-20 DIAGNOSIS — E039 Hypothyroidism, unspecified: Secondary | ICD-10-CM

## 2017-10-20 DIAGNOSIS — Z801 Family history of malignant neoplasm of trachea, bronchus and lung: Secondary | ICD-10-CM

## 2017-10-20 DIAGNOSIS — I10 Essential (primary) hypertension: Secondary | ICD-10-CM

## 2017-10-20 DIAGNOSIS — Z803 Family history of malignant neoplasm of breast: Secondary | ICD-10-CM

## 2017-10-20 DIAGNOSIS — D509 Iron deficiency anemia, unspecified: Secondary | ICD-10-CM

## 2017-10-20 LAB — COMPREHENSIVE METABOLIC PANEL
ALBUMIN: 3.3 g/dL — AB (ref 3.5–5.0)
ALK PHOS: 60 U/L (ref 38–126)
ALT: 10 U/L (ref 0–44)
AST: 14 U/L — AB (ref 15–41)
Anion gap: 8 (ref 5–15)
BILIRUBIN TOTAL: 0.5 mg/dL (ref 0.3–1.2)
BUN: 25 mg/dL — AB (ref 8–23)
CALCIUM: 8.7 mg/dL — AB (ref 8.9–10.3)
CO2: 22 mmol/L (ref 22–32)
CREATININE: 1.1 mg/dL — AB (ref 0.44–1.00)
Chloride: 103 mmol/L (ref 98–111)
GFR calc Af Amer: 51 mL/min — ABNORMAL LOW (ref >60)
GFR, EST NON AFRICAN AMERICAN: 44 mL/min — AB (ref >60)
GLUCOSE: 120 mg/dL — AB (ref 70–99)
POTASSIUM: 4.5 mmol/L (ref 3.5–5.1)
Sodium: 133 mmol/L — ABNORMAL LOW (ref 135–145)
TOTAL PROTEIN: 7.5 g/dL (ref 6.5–8.1)

## 2017-10-20 LAB — CBC WITH DIFFERENTIAL/PLATELET
Basophils Absolute: 0.1 10*3/uL (ref 0–0.1)
Basophils Relative: 1 %
Eosinophils Absolute: 0.4 10*3/uL (ref 0–0.7)
Eosinophils Relative: 5 %
HEMATOCRIT: 30.2 % — AB (ref 35.0–47.0)
HEMOGLOBIN: 10.1 g/dL — AB (ref 12.0–16.0)
LYMPHS ABS: 0.5 10*3/uL — AB (ref 1.0–3.6)
Lymphocytes Relative: 5 %
MCH: 28.9 pg (ref 26.0–34.0)
MCHC: 33.4 g/dL (ref 32.0–36.0)
MCV: 86.6 fL (ref 80.0–100.0)
MONOS PCT: 9 %
Monocytes Absolute: 0.8 10*3/uL (ref 0.2–0.9)
NEUTROS ABS: 6.9 10*3/uL — AB (ref 1.4–6.5)
Neutrophils Relative %: 80 %
Platelets: 338 10*3/uL (ref 150–440)
RBC: 3.49 MIL/uL — ABNORMAL LOW (ref 3.80–5.20)
RDW: 20.8 % — ABNORMAL HIGH (ref 11.5–14.5)
WBC: 8.6 10*3/uL (ref 3.6–11.0)

## 2017-10-20 MED ORDER — SODIUM CHLORIDE 0.9 % IV SOLN
Freq: Once | INTRAVENOUS | Status: AC
  Administered 2017-10-20: 11:00:00 via INTRAVENOUS
  Filled 2017-10-20: qty 250

## 2017-10-20 MED ORDER — SODIUM CHLORIDE 0.9% FLUSH
10.0000 mL | Freq: Once | INTRAVENOUS | Status: AC
  Administered 2017-10-20: 10 mL via INTRAVENOUS
  Filled 2017-10-20: qty 10

## 2017-10-20 MED ORDER — HEPARIN SOD (PORK) LOCK FLUSH 100 UNIT/ML IV SOLN
500.0000 [IU] | Freq: Once | INTRAVENOUS | Status: AC
  Administered 2017-10-20: 500 [IU] via INTRAVENOUS

## 2017-10-20 MED ORDER — SODIUM CHLORIDE 0.9 % IV SOLN
740.0000 mg | Freq: Once | INTRAVENOUS | Status: AC
  Administered 2017-10-20: 740 mg via INTRAVENOUS
  Filled 2017-10-20: qty 10

## 2017-10-20 NOTE — Progress Notes (Signed)
Here for follow up. Pt c/o some SOB -pulse ox 97% on RA  Pt c/o feeling sleepy  Sleeping throughout the day except for meals pt stated

## 2017-10-24 ENCOUNTER — Other Ambulatory Visit: Payer: Self-pay

## 2017-10-24 ENCOUNTER — Inpatient Hospital Stay (HOSPITAL_BASED_OUTPATIENT_CLINIC_OR_DEPARTMENT_OTHER): Payer: Medicare Other | Admitting: Oncology

## 2017-10-24 ENCOUNTER — Ambulatory Visit: Payer: Medicare Other

## 2017-10-24 ENCOUNTER — Ambulatory Visit
Admission: RE | Admit: 2017-10-24 | Discharge: 2017-10-24 | Disposition: A | Discharge status: Home / Self Care | Payer: Medicare Other | Source: Ambulatory Visit | Attending: Radiation Oncology | Admitting: Radiation Oncology

## 2017-10-24 ENCOUNTER — Encounter: Payer: Self-pay | Admitting: Oncology

## 2017-10-24 ENCOUNTER — Ambulatory Visit
Admission: RE | Admit: 2017-10-24 | Discharge: 2017-10-24 | Disposition: A | Discharge status: Home / Self Care | Payer: Medicare Other | Source: Ambulatory Visit | Attending: Oncology | Admitting: Oncology

## 2017-10-24 VITALS — BP 111/66 | HR 88 | Temp 97.9°F | Resp 24

## 2017-10-24 DIAGNOSIS — C3411 Malignant neoplasm of upper lobe, right bronchus or lung: Secondary | ICD-10-CM

## 2017-10-24 DIAGNOSIS — R0602 Shortness of breath: Secondary | ICD-10-CM

## 2017-10-24 DIAGNOSIS — R630 Anorexia: Secondary | ICD-10-CM

## 2017-10-24 DIAGNOSIS — J9811 Atelectasis: Secondary | ICD-10-CM

## 2017-10-24 DIAGNOSIS — Z801 Family history of malignant neoplasm of trachea, bronchus and lung: Secondary | ICD-10-CM

## 2017-10-24 DIAGNOSIS — R079 Chest pain, unspecified: Secondary | ICD-10-CM | POA: Diagnosis not present

## 2017-10-24 DIAGNOSIS — R05 Cough: Secondary | ICD-10-CM

## 2017-10-24 DIAGNOSIS — K219 Gastro-esophageal reflux disease without esophagitis: Secondary | ICD-10-CM

## 2017-10-24 DIAGNOSIS — C3491 Malignant neoplasm of unspecified part of right bronchus or lung: Secondary | ICD-10-CM

## 2017-10-24 DIAGNOSIS — Z5112 Encounter for antineoplastic immunotherapy: Secondary | ICD-10-CM | POA: Diagnosis not present

## 2017-10-24 DIAGNOSIS — Z803 Family history of malignant neoplasm of breast: Secondary | ICD-10-CM

## 2017-10-24 DIAGNOSIS — K5909 Other constipation: Secondary | ICD-10-CM

## 2017-10-24 DIAGNOSIS — J90 Pleural effusion, not elsewhere classified: Secondary | ICD-10-CM | POA: Insufficient documentation

## 2017-10-24 DIAGNOSIS — K861 Other chronic pancreatitis: Secondary | ICD-10-CM

## 2017-10-24 DIAGNOSIS — I700 Atherosclerosis of aorta: Secondary | ICD-10-CM

## 2017-10-24 DIAGNOSIS — I872 Venous insufficiency (chronic) (peripheral): Secondary | ICD-10-CM

## 2017-10-24 DIAGNOSIS — Z79899 Other long term (current) drug therapy: Secondary | ICD-10-CM

## 2017-10-24 DIAGNOSIS — I10 Essential (primary) hypertension: Secondary | ICD-10-CM

## 2017-10-24 DIAGNOSIS — E039 Hypothyroidism, unspecified: Secondary | ICD-10-CM

## 2017-10-24 DIAGNOSIS — Z87891 Personal history of nicotine dependence: Secondary | ICD-10-CM

## 2017-10-24 DIAGNOSIS — M8580 Other specified disorders of bone density and structure, unspecified site: Secondary | ICD-10-CM

## 2017-10-24 DIAGNOSIS — R531 Weakness: Secondary | ICD-10-CM

## 2017-10-24 DIAGNOSIS — C782 Secondary malignant neoplasm of pleura: Secondary | ICD-10-CM | POA: Diagnosis not present

## 2017-10-24 DIAGNOSIS — R413 Other amnesia: Secondary | ICD-10-CM

## 2017-10-24 DIAGNOSIS — F419 Anxiety disorder, unspecified: Secondary | ICD-10-CM

## 2017-10-24 DIAGNOSIS — Z510 Encounter for antineoplastic radiation therapy: Secondary | ICD-10-CM | POA: Diagnosis not present

## 2017-10-24 DIAGNOSIS — R5383 Other fatigue: Secondary | ICD-10-CM

## 2017-10-24 DIAGNOSIS — E871 Hypo-osmolality and hyponatremia: Secondary | ICD-10-CM

## 2017-10-24 DIAGNOSIS — J439 Emphysema, unspecified: Secondary | ICD-10-CM | POA: Insufficient documentation

## 2017-10-24 DIAGNOSIS — D509 Iron deficiency anemia, unspecified: Secondary | ICD-10-CM

## 2017-10-24 DIAGNOSIS — J449 Chronic obstructive pulmonary disease, unspecified: Secondary | ICD-10-CM

## 2017-10-24 LAB — CBC WITH DIFFERENTIAL/PLATELET
Basophils Absolute: 0.1 10*3/uL (ref 0–0.1)
Basophils Relative: 1 %
EOS ABS: 0.5 10*3/uL (ref 0–0.7)
EOS PCT: 6 %
HCT: 28.3 % — ABNORMAL LOW (ref 35.0–47.0)
HEMOGLOBIN: 9.6 g/dL — AB (ref 12.0–16.0)
LYMPHS ABS: 0.5 10*3/uL — AB (ref 1.0–3.6)
LYMPHS PCT: 6 %
MCH: 28.8 pg (ref 26.0–34.0)
MCHC: 34.1 g/dL (ref 32.0–36.0)
MCV: 84.7 fL (ref 80.0–100.0)
MONOS PCT: 9 %
Monocytes Absolute: 0.8 10*3/uL (ref 0.2–0.9)
Neutro Abs: 6.5 10*3/uL (ref 1.4–6.5)
Neutrophils Relative %: 78 %
Platelets: 355 10*3/uL (ref 150–440)
RBC: 3.34 MIL/uL — ABNORMAL LOW (ref 3.80–5.20)
RDW: 19.7 % — ABNORMAL HIGH (ref 11.5–14.5)
WBC: 8.4 10*3/uL (ref 3.6–11.0)

## 2017-10-24 LAB — COMPREHENSIVE METABOLIC PANEL
ALT: 11 U/L (ref 0–44)
AST: 13 U/L — AB (ref 15–41)
Albumin: 3.1 g/dL — ABNORMAL LOW (ref 3.5–5.0)
Alkaline Phosphatase: 57 U/L (ref 38–126)
Anion gap: 8 (ref 5–15)
BUN: 26 mg/dL — AB (ref 8–23)
CHLORIDE: 101 mmol/L (ref 98–111)
CO2: 21 mmol/L — AB (ref 22–32)
CREATININE: 1.15 mg/dL — AB (ref 0.44–1.00)
Calcium: 9.1 mg/dL (ref 8.9–10.3)
GFR calc Af Amer: 48 mL/min — ABNORMAL LOW (ref >60)
GFR calc non Af Amer: 42 mL/min — ABNORMAL LOW (ref >60)
Glucose, Bld: 103 mg/dL — ABNORMAL HIGH (ref 70–99)
POTASSIUM: 4.3 mmol/L (ref 3.5–5.1)
SODIUM: 130 mmol/L — AB (ref 135–145)
Total Bilirubin: 0.4 mg/dL (ref 0.3–1.2)
Total Protein: 7.6 g/dL (ref 6.5–8.1)

## 2017-10-24 LAB — BRAIN NATRIURETIC PEPTIDE: B NATRIURETIC PEPTIDE 5: 70 pg/mL (ref 0.0–100.0)

## 2017-10-24 MED ORDER — IOHEXOL 350 MG/ML SOLN
50.0000 mL | Freq: Once | INTRAVENOUS | Status: AC | PRN
  Administered 2017-10-24: 50 mL via INTRAVENOUS

## 2017-10-24 NOTE — Progress Notes (Signed)
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 Last given 10/20/17.   Oncology History: Patient was last seen by primary oncologist Dr. Grayland Ormond on 10/20/2017 prior to cycle 4 imfinzi.  She complained of chronic persistent weakness and fatigue, poor appetite and worsening memory issues.  Was instructed to continue Megace as prescribed.  She had intermittent shortness of breath.  Scheduled to reinitiate radiation on 10/24/2017 and to return to clinic in approximately 2 weeks for cycle 5 imfinzi.  Given shortness of breath, chest x-ray was ordered revealing no obvious infiltrates.  Last thoracentesis was 09/22/2017 where 1.3 L of fluid was removed.   She was referred to [facility] today after being seen to begin radiation treatment. Her daughter called to inform staff that she had increased shortness of breath and weakness over the weekend and they are worried about her.    Oncology History    Patient's initial pleural effusion which was drained by thoracentesis in the hospital, but no malignant cells were identified.  Imaging and pathology results are reviewed extensively.  Patient was also discussed at cancer conference.  She will benefit from concurrent chemotherapy along with XRT.  Because of patient's age and decreased performance status, radiation oncology plans to do 10 fractions of IMRT rather than a 6-week course.  Patient will complete XRT on August 12, 2017.     Squamous cell carcinoma lung, right ([place])   07/11/2017 Initial Diagnosis    Squamous cell carcinoma lung, right ([place])    07/18/2017 Cancer Staging    Staging form: Lung, AJCC 8th Edition - Clinical stage from 07/18/2017: Stage IIIA (cT2b, cN2, cM0) - Signed by Lloyd Huger, MD on 07/18/2017    07/18/2017 - 08/17/2017 Chemotherapy    The patient had palonosetron (ALOXI) injection 0.25 mg, 0.25 mg, Intravenous,  Once, 4 of 8 cycles Administration: 0.25 mg (07/21/2017), 0.25 mg (07/27/2017), 0.25 mg (08/04/2017), 0.25 mg (08/11/2017) CARBOplatin (PARAPLATIN) 160 mg in sodium chloride 0.9 % 100 mL chemo infusion, 160 mg (100 % of original dose 160.4 mg), Intravenous,  Once, 4 of 8 cycles Dose modification:   (original dose 160.4 mg, Cycle 1) Administration: 150 mg (07/27/2017), 150 mg (08/04/2017), 150 mg (08/11/2017) PACLitaxel (TAXOL) 90 mg in sodium chloride 0.9 % 250 mL chemo infusion (</= 80mg /m2), 45 mg/m2 = 90 mg, Intravenous,  Once, 4 of 8 cycles Administration: 90 mg (07/21/2017), 90 mg (07/27/2017), 90 mg (08/04/2017), 90 mg (08/11/2017)  for chemotherapy treatment.     08/15/2017 -  Chemotherapy    The patient had durvalumab (IMFINZI) 740 mg in sodium chloride 0.9 % 100 mL chemo infusion, 780 mg, Intravenous,  Once, 4 of 6 cycles Administration: 740 mg (08/18/2017), 740 mg (09/01/2017), 740 mg (10/06/2017), 740 mg (10/20/2017)  for chemotherapy treatment.      Non-small cell lung cancer with metastasis ([place])    Initial Diagnosis    Non-small cell lung cancer with metastasis ([place])     Subjective Data:   Subjective:    Nicole Arnold is a 84 y.o. female who presents for evaluation of chest wall pain and cough Onset was 2  days ago. Symptoms have been worsening since that time. The patient describes the pain as sharp and stabbing in the lateral chest wall: on the left. Patient rates pain as a 6/10 in intensity. Associated symptoms are: cough and shortness of breath. Aggravating factors are: coughing. Alleviating factors are: rest. Mechanism of injury: recently started immuntherapy and radiation. Previous visits for  this problem: none. Evaluation to date: chest x-ray which was normal and lab work which was normal. Treatment to date: prescription NSAIDs: somewhat effective.  The following portions of the patient's history were reviewed and updated as appropriate: allergies, current medications, past family history, past medical history, past social history, past surgical history and problem list.  Review of Systems A comprehensive review of systems was negative except for: Constitutional: positive for fatigue Respiratory: positive for cough, dyspnea on exertion, pleurisy/chest pain and wheezing Integument/breast: positive for pallor Musculoskeletal: positive for muscle weakness Neurological: positive for dizziness and weakness Behavioral/Psych: positive for dementia   Objective:    BP 111/66 (BP Location: Right Arm, Patient Position: Sitting)   Pulse 88   Temp 97.9 F (36.6 C) (Tympanic)   Resp (!) 24   SpO2 96%  General appearance: alert, fatigued and pale Lungs: rhonchi LLL and wheezes RLL Abdomen: soft, non-tender; bowel sounds normal; no masses,  no organomegaly Extremities: extremities normal, atraumatic, no cyanosis or edema and no edema, redness or tenderness in the calves or thighs Skin: Skin color, texture, turgor normal. No rashes or lesions Neurologic: Alert and oriented X 3, normal strength and tone. Normal symmetric reflexes. Normal coordination and gait Mental status: Alert, oriented, thought content appropriate  Imaging Chest x-ray: fracture of the unspecified; unchanged but known rib and partial resolution of bilateral pleural effusions   Assessment:    Chest pain, cough and tachypnia suspicious for PE, worsening pleural effusion, COPD or Heart failure exacerbation.    Plan:      Lung cancer: With recent diagnosis back in June 2019. S/p 4 cycles carbo/taxol with concurrent radiation. Started maintenance imfinzi on 08/18/17. Last given on 10/20/17.  Scheduled to return to clinic  on 11/03/2017 for labs, assessment and cycle 5 Imfinzi.  Left sided chest pain: Thought to be musculoskeletal in nature.  Sent home from hospital with lidocaine patch, K pad and muscle relaxers PRN.  States it improved but has worsened over the past few days.  Will get stat CTA to rule out pulmonary embolism or other acute abnormalities. CTA: negative for acute abnormalities including PE, infection or worsening pleural effusion. Pain is thought to be d/t increasing pleural metastasis and may resolve with radiation. Had first treatment today. Oxygen saturation ok. Vital signs stable. Will continue to keep close eye. Spoke at length with daughter.   Dyspnea: Oxygen saturations maintained at 96% on RA. RR is 24 bpm.  Heart rate controlled.  Blood pressure okay.  Recently had chest x-ray (10/20/17) that revealed stable rib fracture and improving bilateral pleural effusion.  Was seen and admitted to the hospital in late August early September for similar complaints and diagnosed with pneumonia, had 1.3 L pleural effusion which was removed (2nd time since diagnosis). Sent home on oral Doxycycline.  As mentioned above thought to be due to increasing pleural metastasis.  Hopefully will resolve with radiation.  BNP was normal.  EKG was normal.  Hx of SIADH: Sodium level 130 today. Stable. Continues to take Lasix.  Previously on fluid restriction of 1200 mL's daily.   Consulted with Dr. Grayland Ormond and he is in  agreement with plan.  Given that she is clinically stable, we will closely monitor her.  Spoke at length with her daughter Nicole Arnold and she is able to keep a close eye on her.  She will let us know if her breathing changes or she develops any new symptoms.  She is here daily for radiation, so we can see her at any time.  Greater than 50% was spent in counseling and coordination of care with this patient including but not limited to discussion of the relevant topics above (See A&P) including, but not limited to  diagnosis and management of acute and chronic medical conditions.   Faythe Casa, NP 10/25/2017 3:00 PM

## 2017-10-25 ENCOUNTER — Ambulatory Visit
Admission: RE | Admit: 2017-10-25 | Discharge: 2017-10-25 | Disposition: A | Discharge status: Home / Self Care | Payer: Medicare Other | Source: Ambulatory Visit | Attending: Radiation Oncology | Admitting: Radiation Oncology

## 2017-10-25 DIAGNOSIS — Z510 Encounter for antineoplastic radiation therapy: Secondary | ICD-10-CM | POA: Diagnosis not present

## 2017-10-25 DIAGNOSIS — C3411 Malignant neoplasm of upper lobe, right bronchus or lung: Secondary | ICD-10-CM | POA: Diagnosis not present

## 2017-10-26 ENCOUNTER — Ambulatory Visit
Admission: RE | Admit: 2017-10-26 | Discharge: 2017-10-26 | Disposition: A | Discharge status: Home / Self Care | Payer: Medicare Other | Source: Ambulatory Visit | Attending: Radiation Oncology | Admitting: Radiation Oncology

## 2017-10-26 DIAGNOSIS — Z510 Encounter for antineoplastic radiation therapy: Secondary | ICD-10-CM | POA: Diagnosis not present

## 2017-10-27 ENCOUNTER — Ambulatory Visit
Admission: RE | Admit: 2017-10-27 | Discharge: 2017-10-27 | Disposition: A | Discharge status: Home / Self Care | Payer: Medicare Other | Source: Ambulatory Visit | Attending: Radiation Oncology | Admitting: Radiation Oncology

## 2017-10-27 DIAGNOSIS — Z510 Encounter for antineoplastic radiation therapy: Secondary | ICD-10-CM | POA: Diagnosis not present

## 2017-10-28 ENCOUNTER — Ambulatory Visit
Admission: RE | Admit: 2017-10-28 | Discharge: 2017-10-28 | Disposition: A | Discharge status: Home / Self Care | Payer: Medicare Other | Source: Ambulatory Visit | Attending: Oncology | Admitting: Oncology

## 2017-10-28 ENCOUNTER — Inpatient Hospital Stay: Payer: Medicare Other | Attending: Oncology | Admitting: Oncology

## 2017-10-28 ENCOUNTER — Encounter: Payer: Self-pay | Admitting: Oncology

## 2017-10-28 ENCOUNTER — Ambulatory Visit
Admission: RE | Admit: 2017-10-28 | Discharge: 2017-10-28 | Disposition: A | Discharge status: Home / Self Care | Payer: Medicare Other | Source: Ambulatory Visit | Attending: Radiation Oncology | Admitting: Radiation Oncology

## 2017-10-28 ENCOUNTER — Inpatient Hospital Stay: Payer: Medicare Other

## 2017-10-28 VITALS — BP 100/63 | HR 84 | Temp 98.1°F | Resp 26

## 2017-10-28 DIAGNOSIS — Z9221 Personal history of antineoplastic chemotherapy: Secondary | ICD-10-CM

## 2017-10-28 DIAGNOSIS — J90 Pleural effusion, not elsewhere classified: Secondary | ICD-10-CM | POA: Insufficient documentation

## 2017-10-28 DIAGNOSIS — Z87891 Personal history of nicotine dependence: Secondary | ICD-10-CM | POA: Diagnosis not present

## 2017-10-28 DIAGNOSIS — R05 Cough: Secondary | ICD-10-CM | POA: Insufficient documentation

## 2017-10-28 DIAGNOSIS — R0602 Shortness of breath: Secondary | ICD-10-CM

## 2017-10-28 DIAGNOSIS — C3491 Malignant neoplasm of unspecified part of right bronchus or lung: Secondary | ICD-10-CM | POA: Diagnosis present

## 2017-10-28 DIAGNOSIS — G4700 Insomnia, unspecified: Secondary | ICD-10-CM | POA: Diagnosis not present

## 2017-10-28 DIAGNOSIS — J9811 Atelectasis: Secondary | ICD-10-CM | POA: Diagnosis not present

## 2017-10-28 DIAGNOSIS — I700 Atherosclerosis of aorta: Secondary | ICD-10-CM | POA: Diagnosis not present

## 2017-10-28 DIAGNOSIS — J984 Other disorders of lung: Secondary | ICD-10-CM | POA: Diagnosis not present

## 2017-10-28 DIAGNOSIS — R12 Heartburn: Secondary | ICD-10-CM

## 2017-10-28 DIAGNOSIS — Z510 Encounter for antineoplastic radiation therapy: Secondary | ICD-10-CM | POA: Diagnosis not present

## 2017-10-28 MED ORDER — IPRATROPIUM-ALBUTEROL 0.5-2.5 (3) MG/3ML IN SOLN
RESPIRATORY_TRACT | Status: AC
  Filled 2017-10-28: qty 3

## 2017-10-28 MED ORDER — ALBUTEROL SULFATE HFA 108 (90 BASE) MCG/ACT IN AERS: 2.0000 | INHALATION_SPRAY | Freq: Four times a day (QID) | RESPIRATORY_TRACT | 2 refills | Status: AC | PRN

## 2017-10-28 MED ORDER — GUAIFENESIN 100 MG/5ML PO SOLN: 5.0000 mL | ORAL | 0 refills | Status: AC | PRN

## 2017-10-28 MED ORDER — IPRATROPIUM-ALBUTEROL 0.5-2.5 (3) MG/3ML IN SOLN
3.0000 mL | Freq: Four times a day (QID) | RESPIRATORY_TRACT | Status: DC
  Administered 2017-10-28: 3 mL via RESPIRATORY_TRACT

## 2017-10-28 NOTE — Progress Notes (Signed)
Symptom Management Consult note [facility]  [phone] [phone]  Patient Care Team: Venia Carbon, MD as PCP - General (Internal Medicine) Telford Nab, RN as Registered Nurse   Name of the patient: Nicole Arnold  [id number]  1928/07/31   Date of visit: 10/28/2017  Diagnosis: There are no diagnoses linked to this encounter.   Chief Complaint: Shortness of breath  Current Treatment:  S/p 4 cycles Imfinzi. Last given 10/20/17.   Oncology History: Patient was seen in [facility] on 10/24/2017 for shortness of breath and weakness.  She was found to have an elevated respiratory rate of 24 breaths/min although all other vital signs were within normal limits. Afebrile. Had recently had a chest x-ray on 10/20/2017 that revealed stable rib fracture and improving bilateral pleural effusion.  Had CTA to rule out pulmonary embolism which was negative.  Oncology History    Patient's initial pleural effusion which was drained by thoracentesis in the hospital, but no malignant cells were identified.  Imaging and pathology results are reviewed extensively.  Patient was also discussed at cancer conference.  She will benefit from concurrent chemotherapy along with XRT.  Because of patient's age and decreased performance status, radiation oncology plans to do 10 fractions of IMRT rather than a 6-week course.  Patient will complete XRT on August 12, 2017.     Squamous cell carcinoma lung, right ([place])   07/11/2017 Initial Diagnosis    Squamous cell carcinoma lung, right ([place])    07/18/2017 Cancer Staging    Staging form: Lung, AJCC 8th Edition - Clinical stage from 07/18/2017: Stage IIIA (cT2b, cN2, cM0) - Signed by Lloyd Huger, MD on 07/18/2017    07/18/2017 - 08/17/2017 Chemotherapy    The patient had palonosetron (ALOXI) injection 0.25 mg, 0.25 mg, Intravenous,  Once, 4 of 8 cycles Administration: 0.25 mg (07/21/2017), 0.25 mg (07/27/2017), 0.25 mg  (08/04/2017), 0.25 mg (08/11/2017) CARBOplatin (PARAPLATIN) 160 mg in sodium chloride 0.9 % 100 mL chemo infusion, 160 mg (100 % of original dose 160.4 mg), Intravenous,  Once, 4 of 8 cycles Dose modification:   (original dose 160.4 mg, Cycle 1) Administration: 150 mg (07/27/2017), 150 mg (08/04/2017), 150 mg (08/11/2017) PACLitaxel (TAXOL) 90 mg in sodium chloride 0.9 % 250 mL chemo infusion (</= 80mg /m2), 45 mg/m2 = 90 mg, Intravenous,  Once, 4 of 8 cycles Administration: 90 mg (07/21/2017), 90 mg (07/27/2017), 90 mg (08/04/2017), 90 mg (08/11/2017)  for chemotherapy treatment.     08/15/2017 -  Chemotherapy    The patient had durvalumab (IMFINZI) 740 mg in sodium chloride 0.9 % 100 mL chemo infusion, 780 mg, Intravenous,  Once, 4 of 6 cycles Administration: 740 mg (08/18/2017), 740 mg (09/01/2017), 740 mg (10/06/2017), 740 mg (10/20/2017)  for chemotherapy treatment.      Non-small cell lung cancer with metastasis ([place])    Initial Diagnosis    Non-small cell lung cancer with metastasis ([place])     Subjective:        Nicole Arnold is a 84 y.o. female here for evaluation of a cough, dyspnea and shortness of breath. Onset of symptoms was 1 week ago. Symptoms have been gradually worsening since that time. The cough is productive of white and yellow sputum and is aggravated by nothing. Associated symptoms include: heartburn, shortness of breath, sputum production and wheezing. Patient does not have a history of asthma. Patient does not have a history of environmental allergens. Patient has not traveled recently. Patient does have a  history of smoking. Patient has had a previous chest x-ray. Patient has not had a PPD done.  The following portions of the patient's history were reviewed and updated as appropriate: allergies, current medications, past family history, past medical history, past social history, past surgical history and problem list.  Review of Systems A comprehensive review of systems was  negative except for: Respiratory: positive for cough, dyspnea on exertion, pleurisy/chest pain and sputum Cardiovascular: positive for chest pressure/discomfort, dyspnea, fatigue and near-syncope Gastrointestinal: positive for reflux symptoms Genitourinary: positive for decreased stream Musculoskeletal: positive for muscle weakness    Objective:    Oxygen saturation 96% on room air BP 100/63 (BP Location: Right Arm, Patient Position: Sitting) Comment: 112/65 standing bp  Pulse 84 Comment: standing pulse 95  Temp 98.1 F (36.7 C) (Tympanic)   Resp (!) 26   SpO2 97% Comment: standing 89% General appearance: alert, fatigued, mild distress, pale and slowed mentation Lungs: rhonchi LLL Heart: regular rate and rhythm, S1, S2 normal, no murmur, click, rub or gallop Abdomen: soft, non-tender; bowel sounds normal; no masses,  no organomegaly    Assessment:    Cough and progressing lung cancer     Plan:    Antitussives per medication orders. B-agonist inhaler. Call if shortness of breath worsens, blood in sputum, change in character of cough, development of fever or chills, inability to maintain nutrition and hydration. Avoid exposure to tobacco smoke and fumes. Chest x-ray.    Lung cancer: Recent diagnosis back in June 2019.  Status post 4 cycles carbo/Taxol with concurrent radiation.  Started any new therapy with Imfinzi on 08/18/2017.  Last given on 10/20/2017.  Scheduled to return to clinic on 11/03/2017 for labs, assessment and cycle 5 Imfinzi.  Dyspnea/cough: Patient appears more dyspneic today than when initially evaluated on 10/24/2017.  Oxygen saturations remained stable at 95-96% on room air.  Respiratory rate is between 24 and 28 breaths/min.  Heart rate controlled and blood pressure within normal limits.  On assessment, left lower lungs are rhonchus.  She is coughing up white/yellow sputum.  Will get repeat chest x-ray to verify no infection or rapid increase of known pleural  effusion. Results of x-ray: Negative for acute process.  Spoke at length with daughter [name] and given no acute abnormalities noted on labs, vital signs or recent imaging, this is likely related to progressing cancer.  Suggested a palliative care consult to review goals of care for patient.  Patient and daughter are on board with referral.  Referral placed today.  Daughter is asking if palliative care consult could be scheduled for this next coming Wednesday as she will be in town.   Insomnia: She is having trouble sleeping due to cough.  Spoke with Dr. Grayland Ormond and okay to prescribe cough medicine to help with sleep.  We will also prescribe albuterol inhaler to help with shortness of breath.  She was encouraged to call clinic or report to the emergency room if symptoms worsened.   Greater than 50% was spent in counseling and coordination of care with this patient including but not limited to discussion of the relevant topics above (See A&P) including, but not limited to diagnosis and management of acute and chronic medical conditions.   Faythe Casa, NP 10/28/2017 4:06 PM

## 2017-10-31 DIAGNOSIS — C7800 Secondary malignant neoplasm of unspecified lung: Secondary | ICD-10-CM | POA: Diagnosis not present

## 2017-10-31 NOTE — Progress Notes (Signed)
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  INTERVAL HISTORY: Patient returns to clinic today for further evaluation and discussion on whether to continue treatment.  Her performance status is significantly declined.  Continues to have persistent weakness and fatigue.  She has a poor appetite and weight loss.  She has increased confusion.  There is no reported fevers.  She has increased shortness of breath and cough, but denies chest pain.  She has no nausea, vomiting, constipation, or diarrhea.  She has no urinary complaints.  Patient generally feels terrible, but offers no further specific complaints today.  REVIEW OF SYSTEMS:   Review of Systems  Constitutional: Positive for malaise/fatigue and weight loss. Negative for fever.  Respiratory: Positive for cough and shortness of breath.   Cardiovascular: Negative.  Negative for chest pain and leg swelling.  Gastrointestinal: Negative.  Negative for abdominal pain, constipation and diarrhea.  Genitourinary: Negative.  Negative for dysuria.  Musculoskeletal: Negative.  Negative for back pain.  Skin: Negative.  Negative for itching and rash.  Neurological: Positive for weakness. Negative for sensory change, focal weakness and headaches.  Psychiatric/Behavioral: Positive for memory loss. Negative for depression. The patient is not nervous/anxious.     As per HPI. Otherwise, a complete review of systems is negative.  PAST MEDICAL HISTORY: Past Medical History:  Diagnosis Date  . Cataract   . Chronic constipation   . Chronic venous insufficiency   . COPD (chronic obstructive pulmonary disease) ([place])   . Essential hypertension,  benign   . GERD (gastroesophageal reflux disease)   . Hypothyroidism   . Mood disorder ([place])    mostly anxiety  . Non-small cell lung cancer with metastasis ([place])   . Osteopenia   . Pancreatic mass   . Pancreatitis since 1961   chronic    PAST SURGICAL HISTORY: Past Surgical History:  Procedure Laterality Date  . APPENDECTOMY  1949  . CATARACT EXTRACTION Right 1987  . CATARACT EXTRACTION Left 1986  . detached retina repair Right 1991  . OOPHORECTOMY  1959   right ovary, part of left  . PORTA CATH INSERTION N/A 07/18/2017   Procedure: PORTA CATH INSERTION;  Surgeon: Algernon Huxley, MD;  Location: [facility];  Service: Cardiovascular;  Laterality: N/A;  . RETINAL DETACHMENT SURGERY Left 1997  . THORACOTOMY Left 2008   due to pneumonia  . uterine hysterectomy  1978    FAMILY HISTORY: Family History  Problem Relation Age of Onset  . Lung cancer Sister   . Breast cancer Neg Hx     ADVANCED DIRECTIVES (Y/N):  N  HEALTH MAINTENANCE: Social History   Tobacco Use  . Smoking status: Former Smoker    Packs/day: 0.50    Years: 30.00    Pack years: 15.00    Types: Cigarettes    Last attempt to quit: 01/25/1993    Years since quitting: 24.7  . Smokeless tobacco: Never Used  Substance Use Topics  . Alcohol use: No  . Drug use: No     Colonoscopy:  PAP:  Bone density:  Lipid panel:  Allergies  Allergen Reactions  . Amoxicillin     Has patient had a PCN reaction causing immediate rash, facial/tongue/throat swelling,  SOB or lightheadedness with hypotension: Yes Has patient had a PCN reaction causing severe rash involving mucus membranes or skin necrosis: No Has patient had a PCN reaction that required hospitalization: Unknown Has patient had a PCN reaction occurring within the last 10 years: Unknown If all of the above answers are "NO", then may proceed with Cephalosporin use.   . Zyrtec [Cetirizine] Itching  . Codeine     vomiting  . Neosporin  [Neomycin-Bacitracin Zn-Polymyx]   . Lidocaine-Prilocaine Rash    Rash w emla cream  . Sulfa Antibiotics Rash    Current Outpatient Medications  Medication Sig Dispense Refill  . ALPRAZolam (XANAX) 0.5 MG tablet Take 0.5 mg by mouth 3 (three) times daily.     Marland Kitchen alum hydroxide-mag trisilicate (GAVISCON) 06-26 MG CHEW chewable tablet Chew 2 tablets by mouth 3 (three) times daily as needed for indigestion or heartburn.    . lansoprazole (PREVACID) 30 MG capsule Take 30 mg by mouth daily at 12 noon.    Marland Kitchen levothyroxine (SYNTHROID, LEVOTHROID) 50 MCG tablet Take 50 mcg by mouth daily before breakfast.    . megestrol (MEGACE) 40 MG tablet Take 1 tablet (40 mg total) by mouth daily. 30 tablet 0  . MICARDIS 40 MG tablet Take 40 mg by mouth daily.    Marland Kitchen PARoxetine (PAXIL) 30 MG tablet Take 30 mg by mouth daily.     . protein supplement shake (PREMIER PROTEIN) LIQD Take 2 oz by mouth 2 (two) times daily between meals.    Marland Kitchen umeclidinium-vilanterol (ANORO ELLIPTA) 62.5-25 MCG/INH AEPB Inhale 1 puff daily into the lungs. 180 each 3  . acetaminophen (TYLENOL) 325 MG tablet Take 650 mg by mouth every 4 (four) hours as needed for mild pain or fever.     Marland Kitchen albuterol (PROVENTIL HFA;VENTOLIN HFA) 108 (90 Base) MCG/ACT inhaler Inhale 2 puffs into the lungs every 6 (six) hours as needed for wheezing or shortness of breath. (Patient not taking: Reported on 11/03/2017) 1 Inhaler 2  . furosemide (LASIX) 20 MG tablet Take 1 tablet (20 mg total) by mouth daily. (Patient not taking: Reported on 11/03/2017) 30 tablet 1  . guaiFENesin (ROBITUSSIN) 100 MG/5ML SOLN Take 5 mLs (100 mg total) by mouth every 4 (four) hours as needed for cough or to loosen phlegm. (Patient not taking: Reported on 11/03/2017) 1200 mL 0  . hydrocortisone cream 1 % Apply 1 application topically 2 (two) times daily.    Marland Kitchen loperamide (IMODIUM) 2 MG capsule Take 2 capsules (4MG) by mouth and 1 capsule (2MG) after each subsequent loose stool - max 8  capsules daily    . ondansetron (ZOFRAN) 8 MG tablet Take 8 mg by mouth 2 (two) times daily as needed for nausea or vomiting.    . polyethylene glycol (MIRALAX / GLYCOLAX) packet Take 17 g by mouth daily as needed for mild constipation. (Patient not taking: Reported on 11/03/2017) 14 each 0  . traMADol (ULTRAM) 50 MG tablet Take 50 mg by mouth every 4 (four) hours as needed for moderate pain.     Marland Kitchen triamcinolone cream (KENALOG) 0.1 % Apply 1 application topically 3 (three) times daily.     No current facility-administered medications for this visit.    Facility-Administered Medications Ordered in Other Visits  Medication Dose Route Frequency Provider Last Rate Last Dose  . heparin lock flush 100 unit/mL  500 Units Intravenous Once Lloyd Huger, MD      . sodium chloride flush (NS) 0.9 % injection 10  mL  10 mL Intravenous PRN Lloyd Huger, MD   10 mL at 11/03/17 0915    OBJECTIVE: Vitals:   11/03/17 0947  BP: (!) 90/56  Pulse: 86  Resp: 18  Temp: (!) 95.5 F (35.3 C)     Body mass index is 28.41 kg/m.    ECOG FS:3 - Symptomatic, >50% confined to bed  General: Frail, no acute distress.  Sitting in a wheelchair. Eyes: Pink conjunctiva, anicteric sclera. HEENT: Normocephalic, moist mucous membranes, clear oropharnyx. Lungs: Clear to auscultation bilaterally. Heart: Regular rate and rhythm. No rubs, murmurs, or gallops. Abdomen: Soft, nontender, nondistended. No organomegaly noted, normoactive bowel sounds. Musculoskeletal: No edema, cyanosis, or clubbing. Neuro: Alert, answering all questions appropriately. Cranial nerves grossly intact. Skin: No rashes or petechiae noted. Psych: Normal affect.  LAB RESULTS:  Lab Results  Component Value Date   NA 133 (L) 11/03/2017   K 4.5 11/03/2017   CL 101 11/03/2017   CO2 20 (L) 11/03/2017   GLUCOSE 132 (H) 11/03/2017   BUN 37 (H) 11/03/2017   CREATININE 1.70 (H) 11/03/2017   CALCIUM 9.2 11/03/2017   PROT 7.7 11/03/2017     ALBUMIN 3.1 (L) 11/03/2017   AST 18 11/03/2017   ALT 10 11/03/2017   ALKPHOS 57 11/03/2017   BILITOT 0.5 11/03/2017   GFRNONAA 26 (L) 11/03/2017   GFRAA 30 (L) 11/03/2017    Lab Results  Component Value Date   WBC 9.3 11/03/2017   NEUTROABS 7.4 11/03/2017   HGB 9.6 (L) 11/03/2017   HCT 30.4 (L) 11/03/2017   MCV 85.6 11/03/2017   PLT 473 (H) 11/03/2017     STUDIES: Dg Chest 2 View  Result Date: 10/28/2017 CLINICAL DATA:  Shortness of breath. EXAM: CHEST - 2 VIEW COMPARISON:  Radiographs of October 20, 2017. FINDINGS: Stable cardiomediastinal silhouette. Atherosclerosis of thoracic aorta is noted. Right internal jugular Port-A-Cath is unchanged in position. Old bilateral rib fractures are noted. No pneumothorax is noted. Stable scarring is seen throughout both lungs. Mild right basilar subsegmental atelectasis is noted with mild right pleural effusion. IMPRESSION: Stable bilateral scarring is noted. Stable right basilar atelectasis is noted with mild associated pleural effusion. Old bilateral rib fractures. Aortic Atherosclerosis (ICD10-I70.0). Electronically Signed   By: Marijo Conception, M.D.   On: 10/28/2017 13:48   Dg Chest 2 View  Result Date: 10/21/2017 CLINICAL DATA:  Cough. Prior history of rib fractures. History of right lung malignancy. History of left thoracotomy. EXAM: CHEST - 2 VIEW COMPARISON:  PET-CT 09/27/2017.  Chest x-ray 09/25/2017. FINDINGS: PowerPort catheter with tip over superior vena cava. Heart size normal. Improved aeration of both lungs. No focal infiltrate noted. Partial scratch resolution of bilateral pleural effusions with mild residual. No pneumothorax P heart size normal. Bilateral scratched it stable bilateral rib fractures. IMPRESSION: 1.  PowerPort catheter stable position. 2. Improved aeration of both lungs. No focal infiltrate noted on today's exam. Partial resolution of bilateral pleural effusions with mild residual. 3. Previously identified rib  fractures are unchanged. No pneumothorax. Electronically Signed   By: Marcello Moores  Register   On: 10/21/2017 06:45   Ct Angio Chest Pe W Or Wo Contrast  Result Date: 10/24/2017 CLINICAL DATA:  Stage IIIA right upper lobe squamous cell lung carcinoma status post concurrent chemotherapy and radiation therapy completed 08/12/2017. Cough, dyspnea, tachypnea. EXAM: CT ANGIOGRAPHY CHEST WITH CONTRAST TECHNIQUE: Multidetector CT imaging of the chest was performed using the standard protocol during bolus administration of intravenous contrast. Multiplanar CT image reconstructions  and MIPs were obtained to evaluate the vascular anatomy. CONTRAST:  14m OMNIPAQUE IOHEXOL 350 MG/ML SOLN COMPARISON:  09/27/2017 PET-CT.  09/22/2017 chest CT. FINDINGS: Cardiovascular: The study is low quality for the evaluation of pulmonary embolism, significantly degraded by motion. No central pulmonary emboli. Questionable segmental right middle lobe pulmonary embolus (series 5/image 127). No additional potential pulmonary emboli. Atherosclerotic nonaneurysmal thoracic aorta. Top-normal caliber main pulmonary artery (3.2 cm diameter), stable. Normal heart size. No significant pericardial fluid/thickening. Three-vessel coronary atherosclerosis. Right internal jugular MediPort terminates in the lower third of the SVC. Mediastinum/Nodes: No discrete thyroid nodules. Unremarkable esophagus. No axillary adenopathy. Stable mildly enlarged 1.0 cm subcarinal node (series 4/image 40). No new pathologically enlarged mediastinal nodes. No pathologically enlarged hilar nodes. Lungs/Pleura: No pneumothorax. Small to moderate dependent right pleural effusion is stable. Peripheral basilar right pleural 3.8 x 2.1 cm mass (series 4/image 81), increased from 3.1 x 1.8 cm. Peripheral right upper lobe 1.4 x 1.3 cm irregular pulmonary nodule (series 6/image 29), mildly decreased from 1.7 x 1.7 cm. Additional smaller scattered pulmonary nodules in both lungs are  stable to decreased. For example a 1.2 cm posterior right upper lobe nodular opacity (series 6/image 27), previously 1.3 cm, slightly decreased. A posterior left upper lobe 1.0 cm nodular opacity (series 6/image 9), previously 1.1 cm, slightly decreased. No acute consolidative airspace disease or new significant pulmonary nodules. Mild centrilobular emphysema. Upper abdomen: Incomplete visualization of known bulky pancreatic body mass. Musculoskeletal: No aggressive appearing focal osseous lesions. Healed deformities in multiple lateral right ribs. Healing lateral left sixth, seventh, eighth and ninth rib fractures. Moderate thoracic spondylosis. Review of the MIP images confirms the above findings. IMPRESSION: 1. Limited significantly motion degraded scan. No central pulmonary emboli. Questionable solitary segmental right middle lobe pulmonary embolus. 2. Peripheral basilar right pleural metastasis has increased in size since 09/27/2017 PET-CT. Small to moderate dependent right pleural effusion is stable. 3. Primary tumor in the peripheral right upper lobe is mildly decreased in size. Additional scattered pulmonary nodules in both lungs are stable to mildly decreased. 4. Incomplete visualization of known bulky pancreatic body mass. Aortic Atherosclerosis (ICD10-I70.0) and Emphysema (ICD10-J43.9). Critical Value/emergent results were called by telephone at the time of interpretation on 10/24/2017 at 2:26 pm to Dr. JAnderson [place] , who verbally acknowledged these results. Electronically Signed   By: JIlona SorrelM.D.   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  PLAN:    1.  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  PET scan results from September 27, 2017 reviewed independently with significant  improvement of the right upper lobe mass and hilar/paratracheal lymphadenopathy.  However pleural metastatic lesion on the right side is larger and more hypermetabolic.  Patient's performance status has significantly declined and she does not wish to pursue any further treatments.  She has agreed to enroll in hospice.  No further follow-up has been scheduled.  Appreciate palliative care input.   2.  Shortness of breath: Chest x-ray does not reveal current pleural effusion.  No further intervention is needed.  Hospice as above.  3.  Poor appetite: Continue Megace as prescribed. 4.  Anxiety: Chronic and unchanged.  Continue Xanax as prescribed. 5.  Hyponatremia: Chronic and unchanged. Sodium is 133. 6.  Anemia: Hemoglobin slowly trending down at 9.6.  No further lab draws are recommended. 7.  Renal insufficiency: Patient's creatinine is increasing and is now 1.7.  Likely multifactorial including poor PO intake.   8.  Disposition: No follow-up has been scheduled.  Hospice as above.   Patient expressed understanding and was in agreement with this plan. She also understands that She can call clinic at any time with any questions, concerns, or complaints.   Cancer Staging Squamous cell carcinoma lung, right ([place]) Staging form: Lung, AJCC 8th Edition - Clinical stage from 07/18/2017: Stage IIIA (cT2b, cN2, cM0) - Signed by Lloyd Huger, MD on 07/18/2017   Lloyd Huger, MD   11/04/2017 3:39 PM

## 2017-11-01 ENCOUNTER — Telehealth: Payer: Self-pay | Admitting: *Deleted

## 2017-11-01 NOTE — Telephone Encounter (Signed)
Left vm for patients daughter to return call regarding palliative care referral. Call placed to follow up on referral placed Friday to see status of palliative care services being initiated at [facility].

## 2017-11-03 ENCOUNTER — Inpatient Hospital Stay (HOSPITAL_BASED_OUTPATIENT_CLINIC_OR_DEPARTMENT_OTHER): Payer: Medicare Other | Admitting: Hospice and Palliative Medicine

## 2017-11-03 ENCOUNTER — Inpatient Hospital Stay (HOSPITAL_BASED_OUTPATIENT_CLINIC_OR_DEPARTMENT_OTHER): Payer: Medicare Other | Admitting: Oncology

## 2017-11-03 ENCOUNTER — Other Ambulatory Visit: Payer: Self-pay

## 2017-11-03 ENCOUNTER — Ambulatory Visit
Admission: RE | Admit: 2017-11-03 | Discharge: 2017-11-03 | Disposition: A | Discharge status: Home / Self Care | Payer: Medicare Other | Source: Ambulatory Visit | Attending: Oncology | Admitting: Oncology

## 2017-11-03 ENCOUNTER — Inpatient Hospital Stay: Payer: Medicare Other

## 2017-11-03 VITALS — BP 90/56 | HR 86 | Temp 95.5°F | Resp 18 | Wt 160.4 lb

## 2017-11-03 DIAGNOSIS — E871 Hypo-osmolality and hyponatremia: Secondary | ICD-10-CM

## 2017-11-03 DIAGNOSIS — D649 Anemia, unspecified: Secondary | ICD-10-CM

## 2017-11-03 DIAGNOSIS — F419 Anxiety disorder, unspecified: Secondary | ICD-10-CM

## 2017-11-03 DIAGNOSIS — Z515 Encounter for palliative care: Secondary | ICD-10-CM | POA: Diagnosis not present

## 2017-11-03 DIAGNOSIS — R0602 Shortness of breath: Secondary | ICD-10-CM

## 2017-11-03 DIAGNOSIS — Z9221 Personal history of antineoplastic chemotherapy: Secondary | ICD-10-CM

## 2017-11-03 DIAGNOSIS — R05 Cough: Secondary | ICD-10-CM | POA: Diagnosis not present

## 2017-11-03 DIAGNOSIS — C3491 Malignant neoplasm of unspecified part of right bronchus or lung: Secondary | ICD-10-CM

## 2017-11-03 DIAGNOSIS — N289 Disorder of kidney and ureter, unspecified: Secondary | ICD-10-CM

## 2017-11-03 DIAGNOSIS — Z87891 Personal history of nicotine dependence: Secondary | ICD-10-CM

## 2017-11-03 LAB — CBC WITH DIFFERENTIAL/PLATELET
Abs Immature Granulocytes: 0.11 10*3/uL — ABNORMAL HIGH (ref 0.00–0.07)
Basophils Absolute: 0.1 10*3/uL (ref 0.0–0.1)
Basophils Relative: 1 %
EOS PCT: 5 %
Eosinophils Absolute: 0.4 10*3/uL (ref 0.0–0.5)
HEMATOCRIT: 30.4 % — AB (ref 36.0–46.0)
HEMOGLOBIN: 9.6 g/dL — AB (ref 12.0–15.0)
Immature Granulocytes: 1 %
LYMPHS ABS: 0.4 10*3/uL — AB (ref 0.7–4.0)
LYMPHS PCT: 4 %
MCH: 27 pg (ref 26.0–34.0)
MCHC: 31.6 g/dL (ref 30.0–36.0)
MCV: 85.6 fL (ref 80.0–100.0)
Monocytes Absolute: 0.9 10*3/uL (ref 0.1–1.0)
Monocytes Relative: 10 %
Neutro Abs: 7.4 10*3/uL (ref 1.7–7.7)
Neutrophils Relative %: 79 %
Platelets: 473 10*3/uL — ABNORMAL HIGH (ref 150–400)
RBC: 3.55 MIL/uL — AB (ref 3.87–5.11)
RDW: 16.8 % — ABNORMAL HIGH (ref 11.5–15.5)
WBC: 9.3 10*3/uL (ref 4.0–10.5)
nRBC: 0 % (ref 0.0–0.2)

## 2017-11-03 LAB — COMPREHENSIVE METABOLIC PANEL
ALT: 10 U/L (ref 0–44)
AST: 18 U/L (ref 15–41)
Albumin: 3.1 g/dL — ABNORMAL LOW (ref 3.5–5.0)
Alkaline Phosphatase: 57 U/L (ref 38–126)
Anion gap: 12 (ref 5–15)
BUN: 37 mg/dL — AB (ref 8–23)
CHLORIDE: 101 mmol/L (ref 98–111)
CO2: 20 mmol/L — ABNORMAL LOW (ref 22–32)
CREATININE: 1.7 mg/dL — AB (ref 0.44–1.00)
Calcium: 9.2 mg/dL (ref 8.9–10.3)
GFR calc Af Amer: 30 mL/min — ABNORMAL LOW (ref >60)
GFR, EST NON AFRICAN AMERICAN: 26 mL/min — AB (ref >60)
GLUCOSE: 132 mg/dL — AB (ref 70–99)
Potassium: 4.5 mmol/L (ref 3.5–5.1)
Sodium: 133 mmol/L — ABNORMAL LOW (ref 135–145)
Total Bilirubin: 0.5 mg/dL (ref 0.3–1.2)
Total Protein: 7.7 g/dL (ref 6.5–8.1)

## 2017-11-03 MED ORDER — SODIUM CHLORIDE 0.9% FLUSH
10.0000 mL | INTRAVENOUS | Status: AC | PRN
  Administered 2017-11-03: 10 mL via INTRAVENOUS
  Filled 2017-11-03: qty 10

## 2017-11-03 MED ORDER — HEPARIN SOD (PORK) LOCK FLUSH 100 UNIT/ML IV SOLN
500.0000 [IU] | Freq: Once | INTRAVENOUS | Status: AC
  Administered 2017-11-03: 500 [IU] via INTRAVENOUS

## 2017-11-03 NOTE — Progress Notes (Signed)
Here for follow up. Noted to have continued rattly cough -per pt she notices sputum to be light yellow to light green .   Daughter stated pt was having sob today. Pulse ox 97% on RA.

## 2017-11-03 NOTE — Progress Notes (Signed)
[place]  [phone] [phone]   Name: Nicole Arnold Date: 11/03/2017 MRN: [id number]  DOB: 1928-04-12  Patient Care Team: Venia Carbon, MD as PCP - General (Internal Medicine) Telford Nab, RN as Registered Nurse    REASON FOR CONSULTATION: Palliative Care consult requested for this 84 y.o. female with multiple medical problems including stage IIIa squamous cell carcinoma of the lung who is status post XRT and chemotherapy and has been on immunotherapy with Imfinzi.  PMH also notable for COPD (not on O2), hypothyroidism, hypertension, and history of pancreatic mass.  Patient presents to the clinic today to see Dr. Grayland Ormond for consideration of continued treatment.  However, patient has had progressive decline over past months with worsening weakness, poor oral intake, and shortness of breath.  Palliative care was asked to see her today to help establish treatment goals.  SOCIAL HISTORY:    Patient is married and lives at [facility] with her husband.  She transitioned several months ago to the skilled nursing facility at [facility].  She has a daughter who is very involved in her care.  ADVANCE DIRECTIVES:  Patient's husband is her healthcare power of attorney, with daughter listed as alternate.  Patient has a living will.  CODE STATUS: DNR  PAST MEDICAL HISTORY: Past Medical History:  Diagnosis Date  . Cataract   . Chronic constipation   . Chronic venous insufficiency   . COPD (chronic obstructive pulmonary disease) ([place])   . Essential hypertension, benign   . GERD (gastroesophageal reflux disease)   . Hypothyroidism   . Mood disorder ([place])    mostly anxiety  . Non-small cell lung cancer with metastasis ([place])   . Osteopenia   . Pancreatic mass   . Pancreatitis since 1961   chronic    PAST SURGICAL HISTORY:  Past Surgical History:  Procedure Laterality Date  . APPENDECTOMY  1949  .  CATARACT EXTRACTION Right 1987  . CATARACT EXTRACTION Left 1986  . detached retina repair Right 1991  . OOPHORECTOMY  1959   right ovary, part of left  . PORTA CATH INSERTION N/A 07/18/2017   Procedure: PORTA CATH INSERTION;  Surgeon: Algernon Huxley, MD;  Location: [facility];  Service: Cardiovascular;  Laterality: N/A;  . RETINAL DETACHMENT SURGERY Left 1997  . THORACOTOMY Left 2008   due to pneumonia  . uterine hysterectomy  1978    HEMATOLOGY/ONCOLOGY HISTORY:  Oncology History    Patient's initial pleural effusion which was drained by thoracentesis in the hospital, but no malignant cells were identified.  Imaging and pathology results are reviewed extensively.  Patient was also discussed at cancer conference.  She will benefit from concurrent chemotherapy along with XRT.  Because of patient's age and decreased performance status, radiation oncology plans to do 10 fractions of IMRT rather than a 6-week course.  Patient will complete XRT on August 12, 2017.     Squamous cell carcinoma lung, right ([place])   07/11/2017 Initial Diagnosis    Squamous cell carcinoma lung, right ([place])    07/18/2017 Cancer Staging    Staging form: Lung, AJCC 8th Edition - Clinical stage from 07/18/2017: Stage IIIA (cT2b, cN2, cM0) - Signed by Lloyd Huger, MD on 07/18/2017    07/18/2017 - 08/17/2017 Chemotherapy    The patient had palonosetron (ALOXI) injection 0.25 mg, 0.25 mg, Intravenous,  Once, 4 of 8 cycles Administration: 0.25 mg (07/21/2017), 0.25 mg (07/27/2017), 0.25 mg (08/04/2017),  0.25 mg (08/11/2017) CARBOplatin (PARAPLATIN) 160 mg in sodium chloride 0.9 % 100 mL chemo infusion, 160 mg (100 % of original dose 160.4 mg), Intravenous,  Once, 4 of 8 cycles Dose modification:   (original dose 160.4 mg, Cycle 1) Administration: 150 mg (07/27/2017), 150 mg (08/04/2017), 150 mg (08/11/2017) PACLitaxel (TAXOL) 90 mg in sodium chloride 0.9 % 250 mL chemo infusion (</= 21m/m2), 45 mg/m2 = 90 mg,  Intravenous,  Once, 4 of 8 cycles Administration: 90 mg (07/21/2017), 90 mg (07/27/2017), 90 mg (08/04/2017), 90 mg (08/11/2017)  for chemotherapy treatment.     08/15/2017 -  Chemotherapy    The patient had durvalumab (IMFINZI) 740 mg in sodium chloride 0.9 % 100 mL chemo infusion, 780 mg, Intravenous,  Once, 4 of 6 cycles Administration: 740 mg (08/18/2017), 740 mg (09/01/2017), 740 mg (10/06/2017), 740 mg (10/20/2017)  for chemotherapy treatment.      Non-small cell lung cancer with metastasis ([place]    Initial Diagnosis    Non-small cell lung cancer with metastasis ([facility])     ALLERGIES:  is allergic to amoxicillin; zyrtec [cetirizine]; codeine; neosporin [neomycin-bacitracin zn-polymyx]; lidocaine-prilocaine; and sulfa antibiotics.  MEDICATIONS:  Current Outpatient Medications  Medication Sig Dispense Refill  . acetaminophen (TYLENOL) 325 MG tablet Take 650 mg by mouth every 4 (four) hours as needed for mild pain or fever.     .Marland Kitchenalbuterol (PROVENTIL HFA;VENTOLIN HFA) 108 (90 Base) MCG/ACT inhaler Inhale 2 puffs into the lungs every 6 (six) hours as needed for wheezing or shortness of breath. (Patient not taking: Reported on 11/03/2017) 1 Inhaler 2  . ALPRAZolam (XANAX) 0.5 MG tablet Take 0.5 mg by mouth 3 (three) times daily.     .Marland Kitchenalum hydroxide-mag trisilicate (GAVISCON) 890-38MG CHEW chewable tablet Chew 2 tablets by mouth 3 (three) times daily as needed for indigestion or heartburn.    . furosemide (LASIX) 20 MG tablet Take 1 tablet (20 mg total) by mouth daily. (Patient not taking: Reported on 11/03/2017) 30 tablet 1  . guaiFENesin (ROBITUSSIN) 100 MG/5ML SOLN Take 5 mLs (100 mg total) by mouth every 4 (four) hours as needed for cough or to loosen phlegm. (Patient not taking: Reported on 11/03/2017) 1200 mL 0  . hydrocortisone cream 1 % Apply 1 application topically 2 (two) times daily.    . lansoprazole (PREVACID) 30 MG capsule Take 30 mg by mouth daily at 12 noon.    .Marland Kitchenlevothyroxine  (SYNTHROID, LEVOTHROID) 50 MCG tablet Take 50 mcg by mouth daily before breakfast.    . loperamide (IMODIUM) 2 MG capsule Take 2 capsules (4MG) by mouth and 1 capsule (2MG) after each subsequent loose stool - max 8 capsules daily    . megestrol (MEGACE) 40 MG tablet Take 1 tablet (40 mg total) by mouth daily. 30 tablet 0  . MICARDIS 40 MG tablet Take 40 mg by mouth daily.    . ondansetron (ZOFRAN) 8 MG tablet Take 8 mg by mouth 2 (two) times daily as needed for nausea or vomiting.    .Marland KitchenPARoxetine (PAXIL) 30 MG tablet Take 30 mg by mouth daily.     . polyethylene glycol (MIRALAX / GLYCOLAX) packet Take 17 g by mouth daily as needed for mild constipation. (Patient not taking: Reported on 11/03/2017) 14 each 0  . protein supplement shake (PREMIER PROTEIN) LIQD Take 2 oz by mouth 2 (two) times daily between meals.    . traMADol (ULTRAM) 50 MG tablet Take 50 mg by mouth every 4 (four) hours  as needed for moderate pain.     Marland Kitchen triamcinolone cream (KENALOG) 0.1 % Apply 1 application topically 3 (three) times daily.    Marland Kitchen umeclidinium-vilanterol (ANORO ELLIPTA) 62.5-25 MCG/INH AEPB Inhale 1 puff daily into the lungs. 180 each 3   No current facility-administered medications for this visit.    Facility-Administered Medications Ordered in Other Visits  Medication Dose Route Frequency Provider Last Rate Last Dose  . heparin lock flush 100 unit/mL  500 Units Intravenous Once Lloyd Huger, MD      . heparin lock flush 100 unit/mL  500 Units Intravenous Once Lloyd Huger, MD      . sodium chloride flush (NS) 0.9 % injection 10 mL  10 mL Intravenous PRN Lloyd Huger, MD   10 mL at 11/03/17 0915    VITAL SIGNS: There were no vitals taken for this visit. There were no vitals filed for this visit.  Estimated body mass index is 28.41 kg/m as calculated from the following:   Height as of 09/22/17: '5\' 3"'  (1.6 m).   Weight as of an earlier encounter on 11/03/17: 160 lb 6.4 oz (72.8  kg).  LABS: CBC:    Component Value Date/Time   WBC 9.3 11/03/2017 0930   HGB 9.6 (L) 11/03/2017 0930   HCT 30.4 (L) 11/03/2017 0930   PLT 473 (H) 11/03/2017 0930   MCV 85.6 11/03/2017 0930   NEUTROABS 7.4 11/03/2017 0930   LYMPHSABS 0.4 (L) 11/03/2017 0930   MONOABS 0.9 11/03/2017 0930   EOSABS 0.4 11/03/2017 0930   BASOSABS 0.1 11/03/2017 0930   Comprehensive Metabolic Panel:    Component Value Date/Time   NA 133 (L) 11/03/2017 0930   K 4.5 11/03/2017 0930   CL 101 11/03/2017 0930   CO2 20 (L) 11/03/2017 0930   BUN 37 (H) 11/03/2017 0930   CREATININE 1.70 (H) 11/03/2017 0930   GLUCOSE 132 (H) 11/03/2017 0930   CALCIUM 9.2 11/03/2017 0930   AST 18 11/03/2017 0930   ALT 10 11/03/2017 0930   ALKPHOS 57 11/03/2017 0930   BILITOT 0.5 11/03/2017 0930   PROT 7.7 11/03/2017 0930   ALBUMIN 3.1 (L) 11/03/2017 0930    RADIOGRAPHIC STUDIES: Dg Chest 2 View  Result Date: 10/28/2017 CLINICAL DATA:  Shortness of breath. EXAM: CHEST - 2 VIEW COMPARISON:  Radiographs of October 20, 2017. FINDINGS: Stable cardiomediastinal silhouette. Atherosclerosis of thoracic aorta is noted. Right internal jugular Port-A-Cath is unchanged in position. Old bilateral rib fractures are noted. No pneumothorax is noted. Stable scarring is seen throughout both lungs. Mild right basilar subsegmental atelectasis is noted with mild right pleural effusion. IMPRESSION: Stable bilateral scarring is noted. Stable right basilar atelectasis is noted with mild associated pleural effusion. Old bilateral rib fractures. Aortic Atherosclerosis (ICD10-I70.0). Electronically Signed   By: Marijo Conception, M.D.   On: 10/28/2017 13:48   Dg Chest 2 View  Result Date: 10/21/2017 CLINICAL DATA:  Cough. Prior history of rib fractures. History of right lung malignancy. History of left thoracotomy. EXAM: CHEST - 2 VIEW COMPARISON:  PET-CT 09/27/2017.  Chest x-ray 09/25/2017. FINDINGS: PowerPort catheter with tip over superior vena  cava. Heart size normal. Improved aeration of both lungs. No focal infiltrate noted. Partial scratch resolution of bilateral pleural effusions with mild residual. No pneumothorax P heart size normal. Bilateral scratched it stable bilateral rib fractures. IMPRESSION: 1.  PowerPort catheter stable position. 2. Improved aeration of both lungs. No focal infiltrate noted on today's exam. Partial resolution of bilateral  pleural effusions with mild residual. 3. Previously identified rib fractures are unchanged. No pneumothorax. Electronically Signed   By: Marcello Moores  Register   On: 10/21/2017 06:45   Ct Angio Chest Pe W Or Wo Contrast  Result Date: 10/24/2017 CLINICAL DATA:  Stage IIIA right upper lobe squamous cell lung carcinoma status post concurrent chemotherapy and radiation therapy completed 08/12/2017. Cough, dyspnea, tachypnea. EXAM: CT ANGIOGRAPHY CHEST WITH CONTRAST TECHNIQUE: Multidetector CT imaging of the chest was performed using the standard protocol during bolus administration of intravenous contrast. Multiplanar CT image reconstructions and MIPs were obtained to evaluate the vascular anatomy. CONTRAST:  39m OMNIPAQUE IOHEXOL 350 MG/ML SOLN COMPARISON:  09/27/2017 PET-CT.  09/22/2017 chest CT. FINDINGS: Cardiovascular: The study is low quality for the evaluation of pulmonary embolism, significantly degraded by motion. No central pulmonary emboli. Questionable segmental right middle lobe pulmonary embolus (series 5/image 127). No additional potential pulmonary emboli. Atherosclerotic nonaneurysmal thoracic aorta. Top-normal caliber main pulmonary artery (3.2 cm diameter), stable. Normal heart size. No significant pericardial fluid/thickening. Three-vessel coronary atherosclerosis. Right internal jugular MediPort terminates in the lower third of the SVC. Mediastinum/Nodes: No discrete thyroid nodules. Unremarkable esophagus. No axillary adenopathy. Stable mildly enlarged 1.0 cm subcarinal node (series  4/image 40). No new pathologically enlarged mediastinal nodes. No pathologically enlarged hilar nodes. Lungs/Pleura: No pneumothorax. Small to moderate dependent right pleural effusion is stable. Peripheral basilar right pleural 3.8 x 2.1 cm mass (series 4/image 81), increased from 3.1 x 1.8 cm. Peripheral right upper lobe 1.4 x 1.3 cm irregular pulmonary nodule (series 6/image 29), mildly decreased from 1.7 x 1.7 cm. Additional smaller scattered pulmonary nodules in both lungs are stable to decreased. For example a 1.2 cm posterior right upper lobe nodular opacity (series 6/image 27), previously 1.3 cm, slightly decreased. A posterior left upper lobe 1.0 cm nodular opacity (series 6/image 9), previously 1.1 cm, slightly decreased. No acute consolidative airspace disease or new significant pulmonary nodules. Mild centrilobular emphysema. Upper abdomen: Incomplete visualization of known bulky pancreatic body mass. Musculoskeletal: No aggressive appearing focal osseous lesions. Healed deformities in multiple lateral right ribs. Healing lateral left sixth, seventh, eighth and ninth rib fractures. Moderate thoracic spondylosis. Review of the MIP images confirms the above findings. IMPRESSION: 1. Limited significantly motion degraded scan. No central pulmonary emboli. Questionable solitary segmental right middle lobe pulmonary embolus. 2. Peripheral basilar right pleural metastasis has increased in size since 09/27/2017 PET-CT. Small to moderate dependent right pleural effusion is stable. 3. Primary tumor in the peripheral right upper lobe is mildly decreased in size. Additional scattered pulmonary nodules in both lungs are stable to mildly decreased. 4. Incomplete visualization of known bulky pancreatic body mass. Aortic Atherosclerosis (ICD10-I70.0) and Emphysema (ICD10-J43.9). Critical Value/emergent results were called by telephone at the time of interpretation on 10/24/2017 at 2:26 pm to Dr. JAnderson MaltaBURNS , who  verbally acknowledged these results. Electronically Signed   By: JIlona SorrelM.D.   On: 10/24/2017 15:11    PERFORMANCE STATUS (ECOG) : 3 - Symptomatic, >50% confined to bed  Review of Systems As noted above. Otherwise, a complete review of systems is negative.  Physical Exam General: NAD, frail appearing, thin, in chair Cardiovascular: regular rate and rhythm Pulmonary: clear ant fields Abdomen: soft, nontender, + bowel sounds GU: no suprapubic tenderness Extremities: no edema Skin: no rashes Neurological: Weakness but otherwise nonfocal  IMPRESSION: I met today in the clinic with patient and her daughter.  Both just met with Dr. FGrayland Ormondand decision was made to stop immunotherapy  with expectation that patient is declining and likely approaching end of life.  Patient was able to relate to me her conversation with Dr. Grayland Ormond and says "what ever happens, happens."  We discussed her goals for end-of-life care.  Patient is clear that she would not want her end-of-life to be in a hospital but would instead prefer "to be by the [facility] watching the birds."  We discussed the possible clinical trajectory at length. Both she and her daughter both want to pursue comfort measures and focus on quality of life.   We discussed option of hospice involvement at [facility].  Patient agreed with hospice.  Will make referral today.  I completed a MOST form today. The patient and family outlined their wishes for the following treatment decisions:  Cardiopulmonary Resuscitation: Do Not Attempt Resuscitation (DNR/No CPR)  Medical Interventions: Comfort Measures: Keep clean, warm, and dry. Use medication by any route, positioning, wound care, and other measures to relieve pain and suffering. Use oxygen, suction and manual treatment of airway obstruction as needed for comfort. Do not transfer to the hospital unless comfort needs cannot be met in current location.  Antibiotics: Determine use of limitation of  antibiotics when infection occurs  IV Fluids: IV fluids for a defined trial period  Feeding Tube: No feeding tube    PLAN: 1. Hospice referral 2. Patient's goal is comfort. She does not want to be rehospitalized and is accepting of potential decline if treatment cannot be offered at facility.  3. Patient has a DNR order at SNF. MOST form completed today as outlined above 4. CXR has been ordered with consideration for repeated thoracentesis if needed for comfort   Patient expressed understanding and was in agreement with this plan. She also understands that She can call clinic at any time with any questions, concerns, or complaints.     Time Total: 45 minutes  Visit consisted of counseling and education dealing with the complex and emotionally intense issues of symptom management and palliative care in the setting of serious and potentially life-threatening illness.Greater than 50%  of this time was spent counseling and coordinating care related to the above assessment and plan.  Signed by: Altha Harm, [place], NP-C, [place] (Work Cell)

## 2017-11-08 ENCOUNTER — Telehealth: Payer: Self-pay | Admitting: *Deleted

## 2017-11-08 NOTE — Telephone Encounter (Signed)
Hospice called to report that patient was opened to [facility] Friday, [place] made the referral and Dr Silvio Pate is the attending

## 2017-11-30 ENCOUNTER — Ambulatory Visit: Payer: Medicare Other | Admitting: Radiation Oncology

## 2017-12-09 DIAGNOSIS — C3490 Malignant neoplasm of unspecified part of unspecified bronchus or lung: Secondary | ICD-10-CM

## 2017-12-09 DIAGNOSIS — J449 Chronic obstructive pulmonary disease, unspecified: Secondary | ICD-10-CM

## 2017-12-09 DIAGNOSIS — F39 Unspecified mood [affective] disorder: Secondary | ICD-10-CM | POA: Diagnosis not present

## 2017-12-09 DIAGNOSIS — I10 Essential (primary) hypertension: Secondary | ICD-10-CM | POA: Diagnosis not present

## 2017-12-09 DIAGNOSIS — E039 Hypothyroidism, unspecified: Secondary | ICD-10-CM

## 2017-12-09 DIAGNOSIS — K219 Gastro-esophageal reflux disease without esophagitis: Secondary | ICD-10-CM

## 2017-12-09 DIAGNOSIS — E43 Unspecified severe protein-calorie malnutrition: Secondary | ICD-10-CM

## 2017-12-09 DIAGNOSIS — E222 Syndrome of inappropriate secretion of antidiuretic hormone: Secondary | ICD-10-CM

## 2017-12-26 DIAGNOSIS — H5712 Ocular pain, left eye: Secondary | ICD-10-CM | POA: Diagnosis not present

## 2017-12-26 DIAGNOSIS — L42 Pityriasis rosea: Secondary | ICD-10-CM | POA: Diagnosis not present

## 2018-01-25 DEATH — deceased

## 2019-08-14 IMAGING — CT NM PET TUM IMG RESTAG (PS) SKULL BASE T - THIGH
9 series · 21 of 25 positions shown · non-contrast
Comparison: Multiple exams, including 07/11/2017

CLINICAL DATA: Subsequent treatment strategy for lung cancer.

EXAM:
NUCLEAR MEDICINE PET SKULL BASE TO THIGH
TECHNIQUE: 9.2 mCi F-18 FDG was injected intravenously. Full-ring PET imaging
was performed from the skull base to thigh after the radiotracer. CT
data was obtained and used for attenuation correction and anatomic
localization.
Fasting blood glucose: 78 mg/dl

[Series 3: ct wb 5.0 b30f · axial · 5.0mm · 0.98mm/px · z∈[-1071,-495]mm · 3 of 290 slices shown]
[im 1/290]
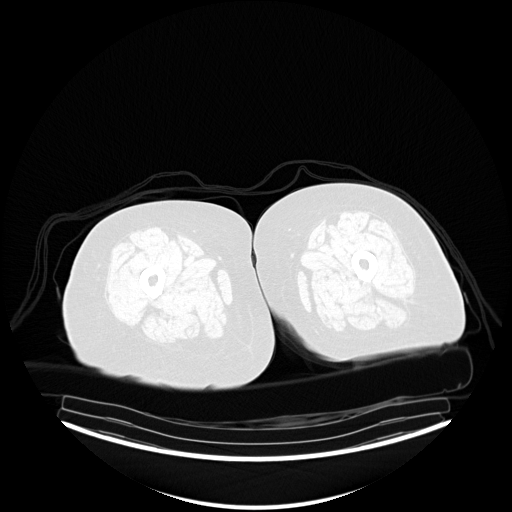
[im 97/290]
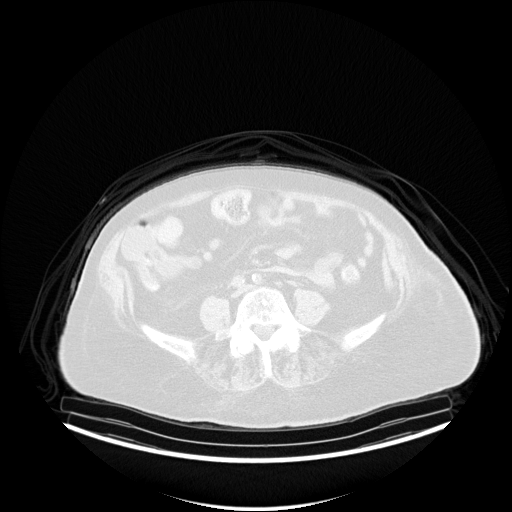
[im 193/290]
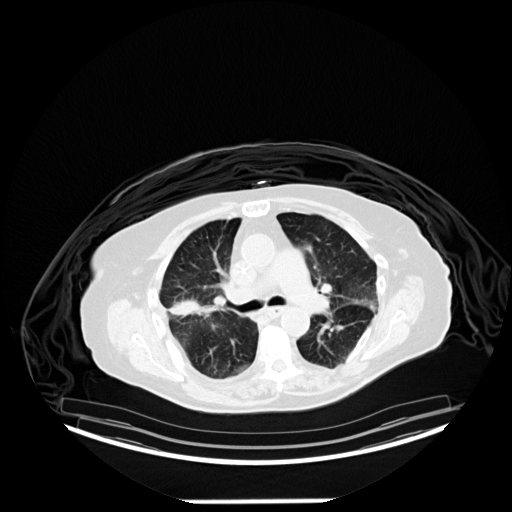

[Series 5: pet wb uncorrected (nac) · axial · 5.0mm · 4.07mm/px · z∈[-1071,-204]mm · 4 of 290 slices shown]
[im 1/290]
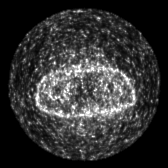
[im 97/290]
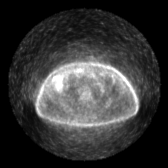
[im 193/290]
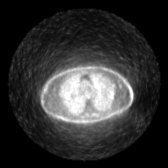
[im 290/290]
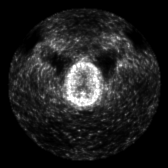

[Series 603: fused axial · 3 of 289 slices shown]
[im 1/289]
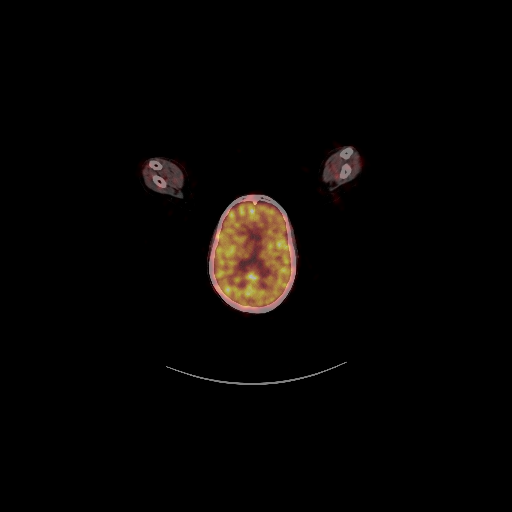
[im 193/289]
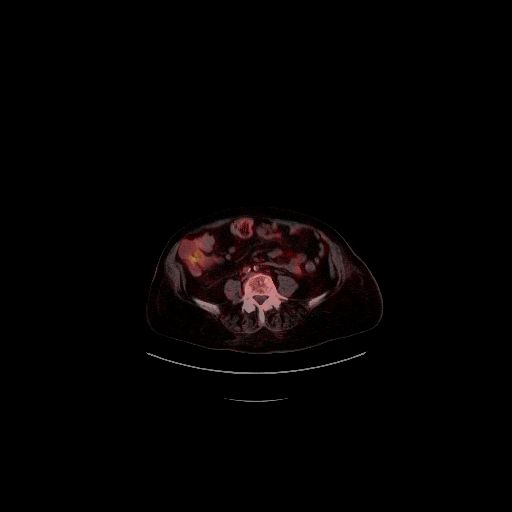
[im 289/289]
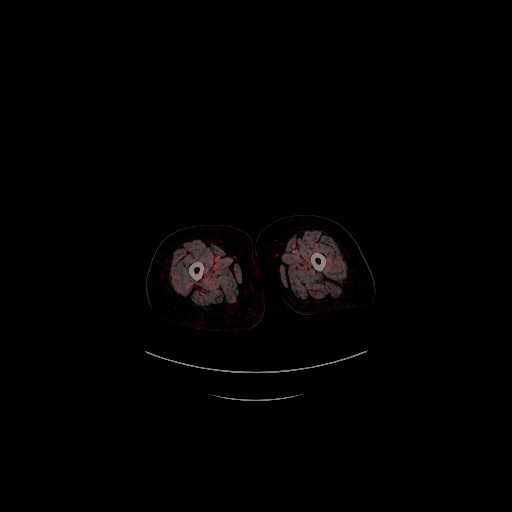

[Series 604: fused coronal · 2 of 112 slices shown]
[im 1/112]
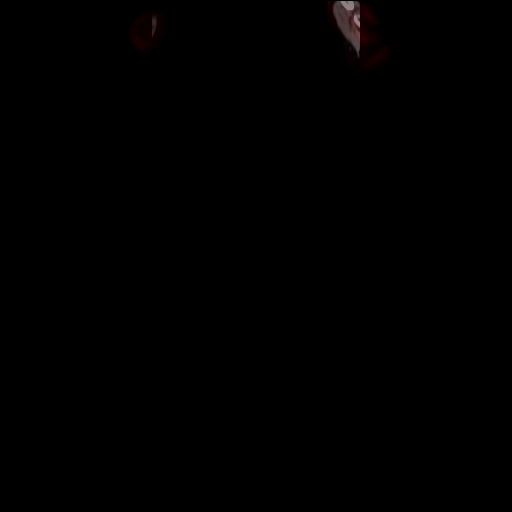
[im 112/112]
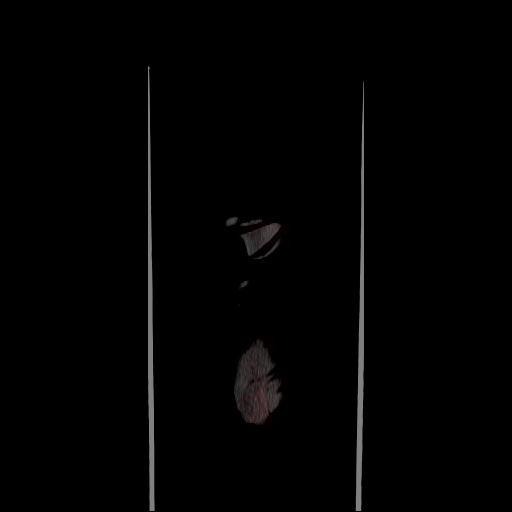

[Series 605: fused sagittal · 1 of 142 slices shown]
[im 1/142]
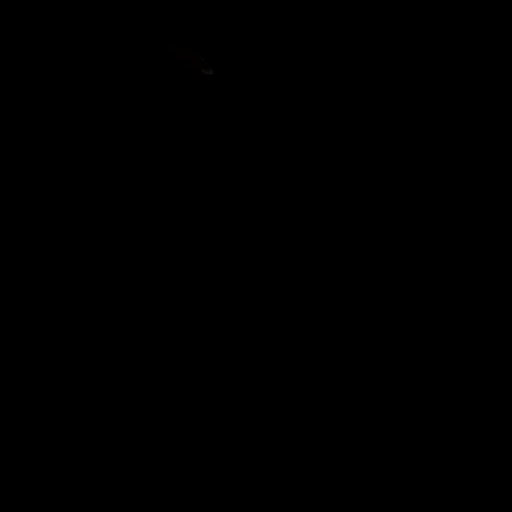

[Series 606: pet axial · 4 of 287 slices shown]
[im 1/287]
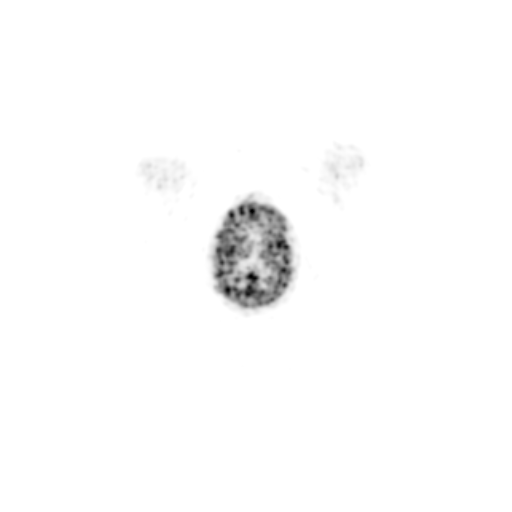
[im 96/287]
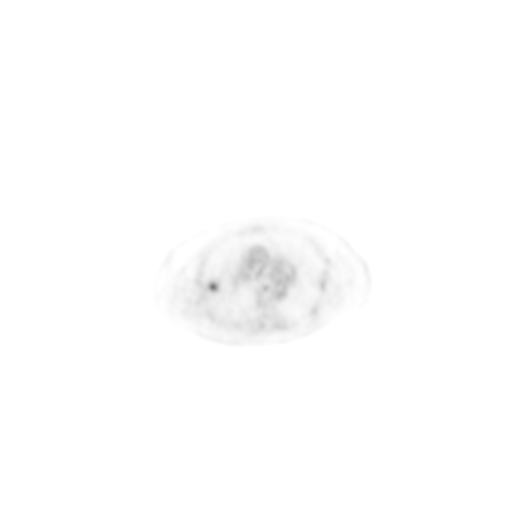
[im 191/287]
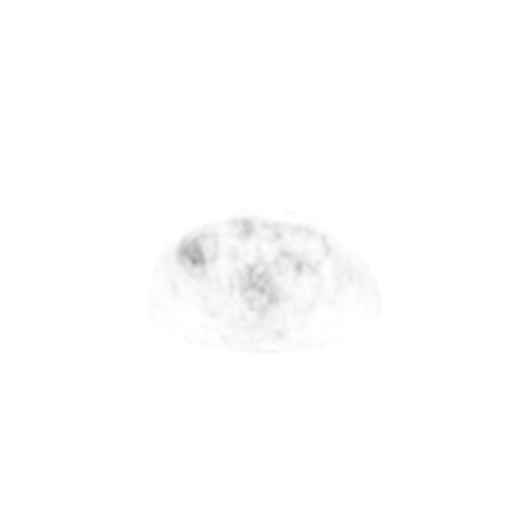
[im 287/287]
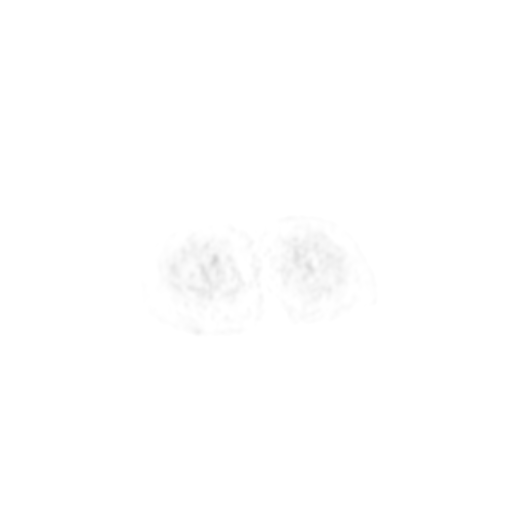

[Series 607: pet coronal · 1 of 115 slices shown]
[im 1/115]
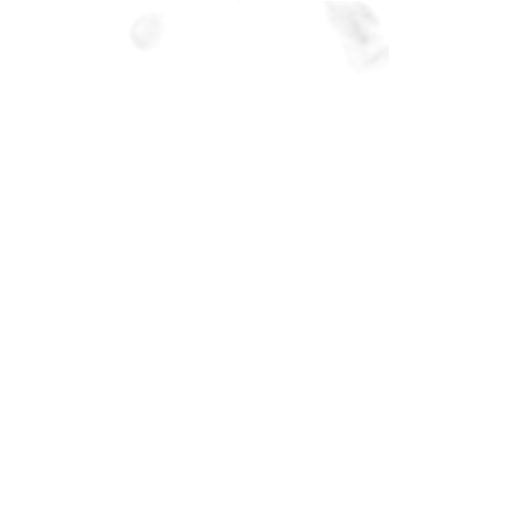

[Series 608: pet sagittal · 2 of 143 slices shown]
[im 1/143]
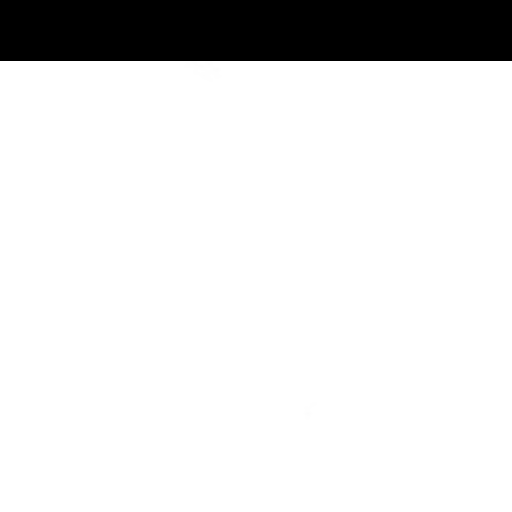
[im 143/143]
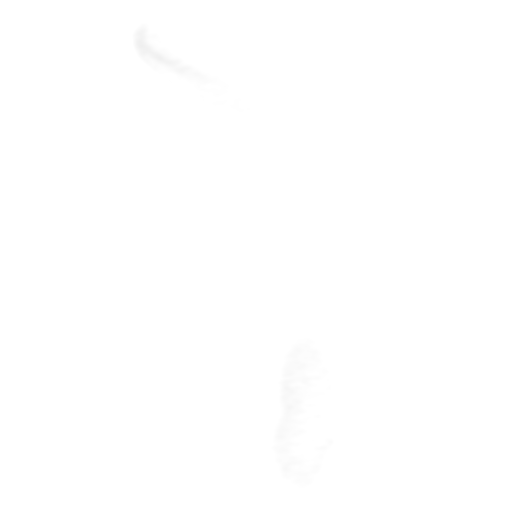

[Series 1198: results mm oncology reading · 5.0mm · 0.87mm/px · 1 of 11 slices shown]
[im 1/11]
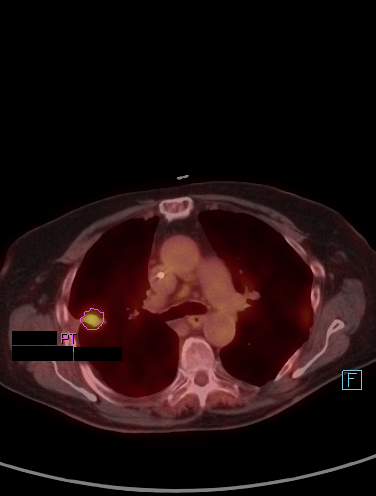

[21 of 25 positions shown; findings below may reference images not displayed]

FINDINGS: Mediastinal blood pool activity: SUV max

NECK: No significant abnormal hypermetabolic activity in this
region.

Incidental CT findings: Atherosclerotic calcification of the common
carotid arteries.

CHEST: A right upper lobe mass measures 1.3 cm in anterior-posterior
dimension (formerly 3.3 cm) with maximum SUV 6.6 (formerly 35.3).

Indistinctly marginated right hilar adenopathy with maximum SUV
(formerly 8.1). A lymph node anterior to the carina measures 0.7 cm
in short axis on image 95/3 (formerly 1.0 cm) with maximum SUV
(formerly 4.0).

A pleural metastatic lesion on the right side measures approximately
1.8 cm in thickness (formerly 1.5 cm) with maximum SUV
(formerly 18.2). Moderate right pleural effusion is likely loculated
and is reduced in size from prior.

There is some accentuated activity along the anterior margin of the
main pulmonary artery without a definite CT correlate, maximum SUV
6.5.

1.1 by 0.6 cm subpleural nodule at the left lung apex noted on
recent CT chest is not appreciably hypermetabolic.

Incidental CT findings: Coronary, aortic arch, and branch vessel
atherosclerotic vascular disease. Right Port-A-Cath tip: Lower SVC.
Mild nodularity along the anterior pericardial adipose tissues.
Small left pleural effusion with associated passive atelectasis.

ABDOMEN/PELVIS: Gastrohepatic mass believed to be arising from the
pancreas with central calcification measures 8.4 by 6.8 cm on image
152/3 (formerly 8.3 by 7.1 cm) with maximum SUV 8.2 (formerly 8.0).

An adjacent small mass of the left adrenal gland measuring 1.0 by
1.2 cm on image 158/3 has a maximum SUV of 10.0 (formerly 4.6).

Activity along the perineum is compatible with mild urinary
incontinence.

Accentuated activity in the hepatic flexure with maximum SUV 9.2 but
without CT correlate, likely physiologic.

Incidental CT findings: Aortoiliac atherosclerotic vascular disease.

SKELETON: Subacute fractures of the left lateral sixth, seventh,
eighth, and ninth ribs with associated low-grade activity compatible
with benign rib fractures.

Incidental CT findings: Old rib fractures and old left pelvic
fractures.
IMPRESSION: 1. Prominently reduced size and activity of the dominant right upper
lobe mass, with improved hilar and lower paratracheal adenopathy.
However, a pleural metastatic lesion on the right side is larger and
more hypermetabolic than on the prior exam.
2. Increased activity along a small left adrenal mass.
3. Essentially stable large pancreatic mass with central
calcification,, slowly enlarging over the past several years, and
with moderately abnormal accentuated metabolic activity.
4. Subacute left lateral rib fractures. Moderate right and small
left pleural effusions.
5. Other imaging findings of potential clinical significance: Aortic
Atherosclerosis (MLPK2-EAT.T). Coronary atherosclerosis. Old rib
fractures and old pelvic fractures are also noted.
